# Patient Record
Sex: Male | Born: 1950 | Race: Black or African American | Hispanic: No | Marital: Single | Smoking: Never smoker
Health system: Southern US, Community
[De-identification: ages and names within clinical notes are randomized; demographics above are authoritative.]

## PROBLEM LIST (undated history)

## (undated) DIAGNOSIS — I639 Cerebral infarction, unspecified: Secondary | ICD-10-CM

## (undated) DIAGNOSIS — K56609 Unspecified intestinal obstruction, unspecified as to partial versus complete obstruction: Secondary | ICD-10-CM

## (undated) DIAGNOSIS — E7800 Pure hypercholesterolemia, unspecified: Secondary | ICD-10-CM

## (undated) DIAGNOSIS — I10 Essential (primary) hypertension: Secondary | ICD-10-CM

## (undated) HISTORY — PX: COLONOSCOPY: SHX174

## (undated) HISTORY — PX: BONE MARROW BIOPSY: SHX199

## (undated) HISTORY — PX: OTHER SURGICAL HISTORY: SHX169

---

## 2009-10-16 DIAGNOSIS — I639 Cerebral infarction, unspecified: Secondary | ICD-10-CM

## 2009-10-16 HISTORY — DX: Cerebral infarction, unspecified: I63.9

## 2009-11-29 ENCOUNTER — Emergency Department (HOSPITAL_COMMUNITY): Admission: EM | Admit: 2009-11-29 | Discharge: 2009-11-29 | Payer: Self-pay | Admitting: Emergency Medicine

## 2011-01-05 LAB — URINALYSIS, ROUTINE W REFLEX MICROSCOPIC
Glucose, UA: 250 mg/dL — AB
Ketones, ur: NEGATIVE mg/dL
Nitrite: NEGATIVE
Protein, ur: NEGATIVE mg/dL
Specific Gravity, Urine: 1.012 (ref 1.005–1.030)
Urobilinogen, UA: 1 mg/dL (ref 0.0–1.0)
pH: 6.5 (ref 5.0–8.0)

## 2011-01-05 LAB — POCT I-STAT, CHEM 8
BUN: 19 mg/dL (ref 6–23)
Chloride: 104 mEq/L (ref 96–112)
Creatinine, Ser: 1.2 mg/dL (ref 0.4–1.5)
Glucose, Bld: 208 mg/dL — ABNORMAL HIGH (ref 70–99)
Hemoglobin: 12.6 g/dL — ABNORMAL LOW (ref 13.0–17.0)
Sodium: 140 mEq/L (ref 135–145)
TCO2: 29 mmol/L (ref 0–100)

## 2011-01-05 LAB — DIFFERENTIAL
Monocytes Relative: 7 % (ref 3–12)
Neutro Abs: 1.9 10*3/uL (ref 1.7–7.7)
Neutrophils Relative %: 53 % (ref 43–77)

## 2011-01-05 LAB — COMPREHENSIVE METABOLIC PANEL
AST: 29 U/L (ref 0–37)
Albumin: 3.6 g/dL (ref 3.5–5.2)
Alkaline Phosphatase: 39 U/L (ref 39–117)
BUN: 17 mg/dL (ref 6–23)
CO2: 31 mEq/L (ref 19–32)
Calcium: 8.4 mg/dL (ref 8.4–10.5)
Chloride: 104 mEq/L (ref 96–112)
GFR calc Af Amer: >60 mL/min (ref >60)
Glucose, Bld: 212 mg/dL — ABNORMAL HIGH (ref 70–99)
Potassium: 3.7 mEq/L (ref 3.5–5.1)
Total Protein: 7 g/dL (ref 6.0–8.3)

## 2011-01-05 LAB — CBC
Hemoglobin: 9.3 g/dL — ABNORMAL LOW (ref 13.0–17.0)
MCV: 110 fL — ABNORMAL HIGH (ref 78.0–100.0)
Platelets: 151 10*3/uL (ref 150–400)
RBC: 2.7 MIL/uL — ABNORMAL LOW (ref 4.22–5.81)
WBC: 3.6 10*3/uL — ABNORMAL LOW (ref 4.0–10.5)

## 2011-01-05 LAB — TYPE AND SCREEN
ABO/RH(D): A POS
Antibody Screen: NEGATIVE

## 2011-01-05 LAB — PROTIME-INR
INR: 1.64 — ABNORMAL HIGH (ref 0.00–1.49)
Prothrombin Time: 19.3 seconds — ABNORMAL HIGH (ref 11.6–15.2)

## 2011-01-05 LAB — APTT: aPTT: 25 seconds (ref 24–37)

## 2011-07-25 DIAGNOSIS — E785 Hyperlipidemia, unspecified: Secondary | ICD-10-CM | POA: Insufficient documentation

## 2013-10-30 DIAGNOSIS — M7520 Bicipital tendinitis, unspecified shoulder: Secondary | ICD-10-CM | POA: Insufficient documentation

## 2013-10-30 DIAGNOSIS — M67919 Unspecified disorder of synovium and tendon, unspecified shoulder: Secondary | ICD-10-CM | POA: Insufficient documentation

## 2013-11-19 HISTORY — PX: ROTATOR CUFF REPAIR: SHX139

## 2014-01-06 ENCOUNTER — Encounter (HOSPITAL_COMMUNITY): Payer: Self-pay | Admitting: Emergency Medicine

## 2014-01-06 DIAGNOSIS — Z79899 Other long term (current) drug therapy: Secondary | ICD-10-CM

## 2014-01-06 DIAGNOSIS — K565 Intestinal adhesions [bands], unspecified as to partial versus complete obstruction: Principal | ICD-10-CM | POA: Diagnosis present

## 2014-01-06 DIAGNOSIS — K5900 Constipation, unspecified: Secondary | ICD-10-CM | POA: Diagnosis present

## 2014-01-06 DIAGNOSIS — R630 Anorexia: Secondary | ICD-10-CM | POA: Diagnosis present

## 2014-01-06 DIAGNOSIS — F1010 Alcohol abuse, uncomplicated: Secondary | ICD-10-CM | POA: Diagnosis present

## 2014-01-06 DIAGNOSIS — A088 Other specified intestinal infections: Secondary | ICD-10-CM | POA: Diagnosis present

## 2014-01-06 DIAGNOSIS — R259 Unspecified abnormal involuntary movements: Secondary | ICD-10-CM | POA: Diagnosis not present

## 2014-01-06 DIAGNOSIS — Z8673 Personal history of transient ischemic attack (TIA), and cerebral infarction without residual deficits: Secondary | ICD-10-CM

## 2014-01-06 DIAGNOSIS — D62 Acute posthemorrhagic anemia: Secondary | ICD-10-CM | POA: Diagnosis not present

## 2014-01-06 DIAGNOSIS — K929 Disease of digestive system, unspecified: Secondary | ICD-10-CM | POA: Diagnosis not present

## 2014-01-06 DIAGNOSIS — A498 Other bacterial infections of unspecified site: Secondary | ICD-10-CM | POA: Diagnosis present

## 2014-01-06 DIAGNOSIS — E872 Acidosis, unspecified: Secondary | ICD-10-CM | POA: Diagnosis present

## 2014-01-06 DIAGNOSIS — E878 Other disorders of electrolyte and fluid balance, not elsewhere classified: Secondary | ICD-10-CM | POA: Diagnosis not present

## 2014-01-06 DIAGNOSIS — M6282 Rhabdomyolysis: Secondary | ICD-10-CM | POA: Diagnosis not present

## 2014-01-06 DIAGNOSIS — Y838 Other surgical procedures as the cause of abnormal reaction of the patient, or of later complication, without mention of misadventure at the time of the procedure: Secondary | ICD-10-CM | POA: Diagnosis not present

## 2014-01-06 DIAGNOSIS — K7200 Acute and subacute hepatic failure without coma: Secondary | ICD-10-CM | POA: Diagnosis present

## 2014-01-06 DIAGNOSIS — N170 Acute kidney failure with tubular necrosis: Secondary | ICD-10-CM | POA: Diagnosis present

## 2014-01-06 DIAGNOSIS — K352 Acute appendicitis with generalized peritonitis, without abscess: Secondary | ICD-10-CM | POA: Diagnosis present

## 2014-01-06 DIAGNOSIS — K560 Paralytic ileus: Secondary | ICD-10-CM | POA: Diagnosis not present

## 2014-01-06 DIAGNOSIS — R066 Hiccough: Secondary | ICD-10-CM | POA: Diagnosis present

## 2014-01-06 DIAGNOSIS — E861 Hypovolemia: Secondary | ICD-10-CM | POA: Diagnosis present

## 2014-01-06 DIAGNOSIS — R5381 Other malaise: Secondary | ICD-10-CM | POA: Diagnosis not present

## 2014-01-06 DIAGNOSIS — A491 Streptococcal infection, unspecified site: Secondary | ICD-10-CM | POA: Diagnosis present

## 2014-01-06 DIAGNOSIS — E46 Unspecified protein-calorie malnutrition: Secondary | ICD-10-CM | POA: Diagnosis not present

## 2014-01-06 DIAGNOSIS — F411 Generalized anxiety disorder: Secondary | ICD-10-CM | POA: Diagnosis not present

## 2014-01-06 DIAGNOSIS — K35209 Acute appendicitis with generalized peritonitis, without abscess, unspecified as to perforation: Secondary | ICD-10-CM | POA: Diagnosis present

## 2014-01-06 DIAGNOSIS — E785 Hyperlipidemia, unspecified: Secondary | ICD-10-CM | POA: Diagnosis present

## 2014-01-06 DIAGNOSIS — E870 Hyperosmolality and hypernatremia: Secondary | ICD-10-CM | POA: Diagnosis not present

## 2014-01-06 DIAGNOSIS — E871 Hypo-osmolality and hyponatremia: Secondary | ICD-10-CM | POA: Diagnosis present

## 2014-01-06 DIAGNOSIS — I10 Essential (primary) hypertension: Secondary | ICD-10-CM | POA: Diagnosis present

## 2014-01-06 NOTE — ED Notes (Signed)
Pt. reports generalized abdominal pain with nausea , vomitting and constipation onset last Thursday seen by his PCP diagnosed with viral illness. No fever or chills.

## 2014-01-07 ENCOUNTER — Encounter (HOSPITAL_COMMUNITY): Payer: Self-pay | Admitting: General Surgery

## 2014-01-07 ENCOUNTER — Inpatient Hospital Stay (HOSPITAL_COMMUNITY)
Admission: EM | Admit: 2014-01-07 | Discharge: 2014-01-21 | DRG: 335 | Disposition: A | Discharge status: Home / Self Care | Payer: BC Managed Care – PPO | Attending: Internal Medicine | Admitting: Internal Medicine

## 2014-01-07 ENCOUNTER — Emergency Department (HOSPITAL_COMMUNITY): Payer: BC Managed Care – PPO

## 2014-01-07 DIAGNOSIS — N179 Acute kidney failure, unspecified: Secondary | ICD-10-CM | POA: Diagnosis present

## 2014-01-07 DIAGNOSIS — D649 Anemia, unspecified: Secondary | ICD-10-CM

## 2014-01-07 DIAGNOSIS — K56609 Unspecified intestinal obstruction, unspecified as to partial versus complete obstruction: Secondary | ICD-10-CM

## 2014-01-07 DIAGNOSIS — Z9889 Other specified postprocedural states: Secondary | ICD-10-CM

## 2014-01-07 DIAGNOSIS — R112 Nausea with vomiting, unspecified: Secondary | ICD-10-CM

## 2014-01-07 DIAGNOSIS — E8729 Other acidosis: Secondary | ICD-10-CM | POA: Diagnosis present

## 2014-01-07 DIAGNOSIS — R17 Unspecified jaundice: Secondary | ICD-10-CM

## 2014-01-07 DIAGNOSIS — E872 Acidosis, unspecified: Secondary | ICD-10-CM

## 2014-01-07 DIAGNOSIS — E871 Hypo-osmolality and hyponatremia: Secondary | ICD-10-CM

## 2014-01-07 DIAGNOSIS — R7401 Elevation of levels of liver transaminase levels: Secondary | ICD-10-CM

## 2014-01-07 DIAGNOSIS — R740 Nonspecific elevation of levels of transaminase and lactic acid dehydrogenase [LDH]: Secondary | ICD-10-CM

## 2014-01-07 DIAGNOSIS — F1010 Alcohol abuse, uncomplicated: Secondary | ICD-10-CM

## 2014-01-07 DIAGNOSIS — K3532 Acute appendicitis with perforation and localized peritonitis, without abscess: Secondary | ICD-10-CM

## 2014-01-07 DIAGNOSIS — N19 Unspecified kidney failure: Secondary | ICD-10-CM

## 2014-01-07 DIAGNOSIS — S37009A Unspecified injury of unspecified kidney, initial encounter: Secondary | ICD-10-CM

## 2014-01-07 HISTORY — DX: Essential (primary) hypertension: I10

## 2014-01-07 HISTORY — DX: Unspecified intestinal obstruction, unspecified as to partial versus complete obstruction: K56.609

## 2014-01-07 HISTORY — DX: Cerebral infarction, unspecified: I63.9

## 2014-01-07 HISTORY — DX: Pure hypercholesterolemia, unspecified: E78.00

## 2014-01-07 LAB — URINALYSIS, ROUTINE W REFLEX MICROSCOPIC
Glucose, UA: NEGATIVE mg/dL
Ketones, ur: NEGATIVE mg/dL
Leukocytes, UA: NEGATIVE
NITRITE: NEGATIVE
PROTEIN: 30 mg/dL — AB
Specific Gravity, Urine: 1.018 (ref 1.005–1.030)
UROBILINOGEN UA: 1 mg/dL (ref 0.0–1.0)
pH: 5 (ref 5.0–8.0)

## 2014-01-07 LAB — COMPREHENSIVE METABOLIC PANEL
ALBUMIN: 3.2 g/dL — AB (ref 3.5–5.2)
ALK PHOS: 94 U/L (ref 39–117)
ALT: 69 U/L — ABNORMAL HIGH (ref 0–53)
AST: 40 U/L — AB (ref 0–37)
BILIRUBIN TOTAL: 4.7 mg/dL — AB (ref 0.3–1.2)
BUN: 90 mg/dL — ABNORMAL HIGH (ref 6–23)
CALCIUM: 9.4 mg/dL (ref 8.4–10.5)
CO2: 23 meq/L (ref 19–32)
CREATININE: 8.62 mg/dL — AB (ref 0.50–1.35)
Chloride: 84 mEq/L — ABNORMAL LOW (ref 96–112)
GFR calc Af Amer: 7 mL/min — ABNORMAL LOW (ref >90)
GFR, EST NON AFRICAN AMERICAN: 6 mL/min — AB (ref >90)
Glucose, Bld: 161 mg/dL — ABNORMAL HIGH (ref 70–99)
Potassium: 4.2 mEq/L (ref 3.7–5.3)
Sodium: 129 mEq/L — ABNORMAL LOW (ref 137–147)
TOTAL PROTEIN: 8.1 g/dL (ref 6.0–8.3)

## 2014-01-07 LAB — CBC WITH DIFFERENTIAL/PLATELET
BASOS ABS: 0.1 10*3/uL (ref 0.0–0.1)
BASOS PCT: 1 % (ref 0–1)
EOS PCT: 0 % (ref 0–5)
Eosinophils Absolute: 0 10*3/uL (ref 0.0–0.7)
HCT: 31.4 % — ABNORMAL LOW (ref 39.0–52.0)
Hemoglobin: 11.6 g/dL — ABNORMAL LOW (ref 13.0–17.0)
LYMPHS PCT: 12 % (ref 12–46)
Lymphs Abs: 1.3 10*3/uL (ref 0.7–4.0)
MCH: 33.6 pg (ref 26.0–34.0)
MCHC: 36.9 g/dL — AB (ref 30.0–36.0)
MCV: 91 fL (ref 78.0–100.0)
MONO ABS: 0.9 10*3/uL (ref 0.1–1.0)
MONOS PCT: 8 % (ref 3–12)
NEUTROS ABS: 8.4 10*3/uL — AB (ref 1.7–7.7)
NEUTROS PCT: 79 % — AB (ref 43–77)
PLATELETS: 288 10*3/uL (ref 150–400)
RBC: 3.45 MIL/uL — ABNORMAL LOW (ref 4.22–5.81)
RDW: 12.3 % (ref 11.5–15.5)
WBC: 10.7 10*3/uL — ABNORMAL HIGH (ref 4.0–10.5)

## 2014-01-07 LAB — BASIC METABOLIC PANEL
BUN: 89 mg/dL — ABNORMAL HIGH (ref 6–23)
CHLORIDE: 88 meq/L — AB (ref 96–112)
CO2: 25 mEq/L (ref 19–32)
Calcium: 8.6 mg/dL (ref 8.4–10.5)
Creatinine, Ser: 7.05 mg/dL — ABNORMAL HIGH (ref 0.50–1.35)
GFR, EST AFRICAN AMERICAN: 9 mL/min — AB (ref >90)
GFR, EST NON AFRICAN AMERICAN: 7 mL/min — AB (ref >90)
GLUCOSE: 112 mg/dL — AB (ref 70–99)
POTASSIUM: 4 meq/L (ref 3.7–5.3)
SODIUM: 132 meq/L — AB (ref 137–147)

## 2014-01-07 LAB — HEPATIC FUNCTION PANEL
ALBUMIN: 2.8 g/dL — AB (ref 3.5–5.2)
ALT: 54 U/L — ABNORMAL HIGH (ref 0–53)
AST: 26 U/L (ref 0–37)
Alkaline Phosphatase: 85 U/L (ref 39–117)
Bilirubin, Direct: 2.5 mg/dL — ABNORMAL HIGH (ref 0.0–0.3)
Indirect Bilirubin: 1.6 mg/dL — ABNORMAL HIGH (ref 0.3–0.9)
Total Bilirubin: 4.1 mg/dL — ABNORMAL HIGH (ref 0.3–1.2)
Total Protein: 7.3 g/dL (ref 6.0–8.3)

## 2014-01-07 LAB — I-STAT VENOUS BLOOD GAS, ED
Bicarbonate: 25.6 mEq/L — ABNORMAL HIGH (ref 20.0–24.0)
O2 SAT: 60 %
PCO2 VEN: 44.8 mmHg — AB (ref 45.0–50.0)
PO2 VEN: 33 mmHg (ref 30.0–45.0)
TCO2: 27 mmol/L (ref 0–100)
pH, Ven: 7.365 — ABNORMAL HIGH (ref 7.250–7.300)

## 2014-01-07 LAB — URINE MICROSCOPIC-ADD ON

## 2014-01-07 LAB — CREATININE, URINE, RANDOM: Creatinine, Urine: 213.8 mg/dL

## 2014-01-07 LAB — LIPASE, BLOOD: LIPASE: 24 U/L (ref 11–59)

## 2014-01-07 LAB — SODIUM, URINE, RANDOM: SODIUM UR: 26 meq/L

## 2014-01-07 LAB — LACTIC ACID, PLASMA: Lactic Acid, Venous: 1.3 mmol/L (ref 0.5–2.2)

## 2014-01-07 MED ORDER — INFLUENZA VAC SPLIT QUAD 0.5 ML IM SUSP
0.5000 mL | INTRAMUSCULAR | Status: AC
  Administered 2014-01-08: 0.5 mL via INTRAMUSCULAR
  Filled 2014-01-07: qty 0.5

## 2014-01-07 MED ORDER — SODIUM CHLORIDE 0.9 % IV SOLN
25.0000 mg | Freq: Once | INTRAVENOUS | Status: DC
  Filled 2014-01-07 (×2): qty 1

## 2014-01-07 MED ORDER — SODIUM CHLORIDE 0.9 % IV SOLN
1000.0000 mL | Freq: Once | INTRAVENOUS | Status: AC
  Administered 2014-01-07: 1000 mL via INTRAVENOUS

## 2014-01-07 MED ORDER — SODIUM CHLORIDE 0.9 % IV SOLN
INTRAVENOUS | Status: DC
  Administered 2014-01-07 – 2014-01-09 (×9): via INTRAVENOUS

## 2014-01-07 MED ORDER — HYDROMORPHONE HCL PF 1 MG/ML IJ SOLN
1.0000 mg | Freq: Once | INTRAMUSCULAR | Status: AC
  Administered 2014-01-07: 1 mg via INTRAVENOUS
  Filled 2014-01-07: qty 1

## 2014-01-07 MED ORDER — ONDANSETRON HCL 4 MG/2ML IJ SOLN
4.0000 mg | Freq: Four times a day (QID) | INTRAMUSCULAR | Status: DC | PRN
Start: 2014-01-07 — End: 2014-01-21
  Administered 2014-01-07: 4 mg via INTRAVENOUS
  Filled 2014-01-07 (×2): qty 2

## 2014-01-07 MED ORDER — HEPARIN SODIUM (PORCINE) 5000 UNIT/ML IJ SOLN
5000.0000 [IU] | Freq: Three times a day (TID) | INTRAMUSCULAR | Status: DC
  Administered 2014-01-07 – 2014-01-15 (×22): 5000 [IU] via SUBCUTANEOUS
  Filled 2014-01-07 (×28): qty 1

## 2014-01-07 MED ORDER — MORPHINE SULFATE 2 MG/ML IJ SOLN
2.0000 mg | INTRAMUSCULAR | Status: DC | PRN
  Administered 2014-01-07 – 2014-01-20 (×15): 2 mg via INTRAVENOUS
  Filled 2014-01-07 (×17): qty 1

## 2014-01-07 MED ORDER — ONDANSETRON HCL 4 MG/2ML IJ SOLN
4.0000 mg | Freq: Once | INTRAMUSCULAR | Status: AC
  Administered 2014-01-07: 4 mg via INTRAVENOUS
  Filled 2014-01-07: qty 2

## 2014-01-07 MED ORDER — LIDOCAINE HCL 2 % EX GEL
Freq: Once | CUTANEOUS | Status: AC
  Administered 2014-01-07: 20
  Filled 2014-01-07: qty 20

## 2014-01-07 MED ORDER — SODIUM CHLORIDE 0.9 % IV BOLUS (SEPSIS)
1000.0000 mL | Freq: Once | INTRAVENOUS | Status: AC
  Administered 2014-01-07: 1000 mL via INTRAVENOUS

## 2014-01-07 MED ORDER — MORPHINE SULFATE 4 MG/ML IJ SOLN
4.0000 mg | Freq: Once | INTRAMUSCULAR | Status: AC
  Administered 2014-01-07: 4 mg via INTRAVENOUS
  Filled 2014-01-07: qty 1

## 2014-01-07 NOTE — ED Notes (Signed)
Pt finished with oral CT contrast. CT made aware. 

## 2014-01-07 NOTE — ED Notes (Signed)
Patient transported to X-ray 

## 2014-01-07 NOTE — ED Notes (Signed)
Patient transported to CT 

## 2014-01-07 NOTE — ED Provider Notes (Signed)
CSN: [id number]     Arrival date & time 01/06/14  2314 History   First MD Initiated Contact with Patient 01/07/14 0325     Chief Complaint  Patient presents with  . Abdominal Pain     (Consider location/radiation/quality/duration/timing/severity/associated sxs/prior Treatment) HPI Comments: Pt comes in with cc of abd pain. Pt has hx of HTN, HL otherwise, pretty healthy. Reports that he started having nausea and abd pain last Thursday. Saw his pcp and was thought to have viral gastro. With time, his symptoms have gotten worse. He is having diffuse, and severe abdominal pain. Several episodes of emesis, with po intake usually, and anorexia. Pt is also having constipation. No abd surgeries.  Patient is a 63 y.o. male presenting with abdominal pain. The history is provided by the patient.  Abdominal Pain Associated symptoms: constipation, nausea and vomiting   Associated symptoms: no chest pain, no cough, no dysuria and no shortness of breath     Past Medical History  Diagnosis Date  . Hypertension   . Hypercholesterolemia   . Stroke    Past Surgical History  Procedure Laterality Date  . Rotator cuff repair     No family history on file. History  Substance Use Topics  . Smoking status: Never Smoker   . Smokeless tobacco: Not on file  . Alcohol Use: Yes    Review of Systems  Constitutional: Negative for activity change and appetite change.  Respiratory: Negative for cough and shortness of breath.   Cardiovascular: Negative for chest pain.  Gastrointestinal: Positive for nausea, vomiting, abdominal pain and constipation.  Genitourinary: Negative for dysuria.  All other systems reviewed and are negative.      Allergies  Review of patient's allergies indicates no known allergies.  Home Medications   Current Outpatient Rx  Name  Route  Sig  Dispense  Refill  . co-enzyme Q-10 30 MG capsule   Oral   Take 30 mg by mouth daily.         Marland Kitchen. lisinopril (PRINIVIL,ZESTRIL)  20 MG tablet   Oral   Take 30-40 mg by mouth daily.         . Multiple Vitamin (MULTIVITAMIN WITH MINERALS) TABS tablet   Oral   Take 1 tablet by mouth daily.         . naproxen sodium (ANAPROX) 220 MG tablet   Oral   Take 440 mg by mouth daily as needed (for pain).         . Omega-3 Fatty Acids (FISH OIL) 1000 MG CAPS   Oral   Take 1,000 mg by mouth daily.         Marland Kitchen. omeprazole (PRILOSEC) 20 MG capsule   Oral   Take 20 mg by mouth daily.         . simvastatin (ZOCOR) 20 MG tablet   Oral   Take 20 mg by mouth daily at 6 PM.          BP 137/70  Pulse 83  Temp(Src) 98.5 F (36.9 C) (Oral)  Resp 18  Ht 6\' 2"  (1.88 m)  Wt 215 lb (97.523 kg)  BMI 27.59 kg/m2  SpO2 97% Physical Exam  Nursing note and vitals reviewed. Constitutional: He is oriented to person, place, and time. He appears well-developed.  HENT:  Head: Normocephalic and atraumatic.  Eyes: Conjunctivae and EOM are normal. Pupils are equal, round, and reactive to light.  Neck: Normal range of motion. Neck supple.  Cardiovascular: Normal rate and regular rhythm.  Pulmonary/Chest: Effort normal and breath sounds normal.  Abdominal: Soft. Bowel sounds are normal. He exhibits distension. There is tenderness. There is guarding. There is no rebound.  Neurological: He is alert and oriented to person, place, and time.  Skin: Skin is warm.    ED Course  Procedures (including critical care time) Labs Review Labs Reviewed  CBC WITH DIFFERENTIAL - Abnormal; Notable for the following:    WBC 10.7 (*)    RBC 3.45 (*)    Hemoglobin 11.6 (*)    HCT 31.4 (*)    MCHC 36.9 (*)    Neutrophils Relative % 79 (*)    Neutro Abs 8.4 (*)    All other components within normal limits  COMPREHENSIVE METABOLIC PANEL - Abnormal; Notable for the following:    Sodium 129 (*)    Chloride 84 (*)    Glucose, Bld 161 (*)    BUN 90 (*)    Creatinine, Ser 8.62 (*)    Albumin 3.2 (*)    AST 40 (*)    ALT 69 (*)    Total  Bilirubin 4.7 (*)    GFR calc non Af Amer 6 (*)    GFR calc Af Amer 7 (*)    All other components within normal limits  I-STAT VENOUS BLOOD GAS, ED - Abnormal; Notable for the following:    pH, Ven 7.365 (*)    pCO2, Ven 44.8 (*)    Bicarbonate 25.6 (*)    All other components within normal limits  LIPASE, BLOOD  URINALYSIS, ROUTINE W REFLEX MICROSCOPIC  SODIUM, URINE, RANDOM  CREATININE, URINE, RANDOM  BLOOD GAS, VENOUS   Imaging Review Dg Abd Acute W/chest  01/07/2014   CLINICAL DATA:  Nausea and vomiting.  Abdominal pain.  EXAM: ACUTE ABDOMEN SERIES (ABDOMEN 2 VIEW & CHEST 1 VIEW)  COMPARISON:  No priors.  FINDINGS: Lung volumes are normal. No consolidative airspace disease. No pleural effusions. No pneumothorax. No pulmonary nodule or mass noted. Pulmonary vasculature and the cardiomediastinal silhouette are within normal limits.  Numerous dilated loops of small bowel measuring up to 6.9 cm in diameter, with multiple air-fluid levels. Paucity of colonic gas. No pneumoperitoneum.  IMPRESSION: 1. Findings, as above, highly concerning for small bowel obstruction. 2. No pneumoperitoneum at this time. 3. No radiographic evidence of acute cardiopulmonary disease.   Electronically Signed   By: Trudie Reed M.D.   On: 01/07/2014 04:02     EKG Interpretation None      MDM   Final diagnoses:  SBO (small bowel obstruction)  AKI (acute kidney injury)  Uremia    DDx includes: Pancreatitis Hepatobiliary pathology including cholecystitis Gastritis/PUD SBO ACS syndrome Aortic Dissection AAA Tumors Intra abdominal abscess Thrombosis Mesenteric ischemia Diverticulitis Peritonitis Appendicitis Hernia Nephrolithiasis Pyelonephritis UTI/Cystitis  PT comes in with cc of abd pain. Pt's exam is concerning - with guarding. AAS ordered - shows concerns for SBO. Surgery was consulted - Dr. Jiles Harold to see patient. CT non contrast ordered  - after which NG lavage will be  placed.  Pt has renal failure, with uremia. Likely pre-renal. Hydration started in the ER. Pt also on naproxen and lisinopril - which are likely contributing. He is aox3, K is normal, BUN is elevated, but i dont think we need to call nephrology at this time. Also, bilirubin is elevated, LFTs are elevated.         Derwood Kaplan, MD 01/07/14 [phone]

## 2014-01-07 NOTE — ED Notes (Signed)
Pt has returned from radiology.  

## 2014-01-07 NOTE — ED Notes (Signed)
MD Dort with general surgery at bedside.

## 2014-01-07 NOTE — H&P (Signed)
Date: 01/07/2014               Patient Name:  Lonnie Henry MRN: [id number]  DOB: May 29, 1951 Age / Sex: 63 y.o., male   PCP: [place] Service: Internal Medicine Teaching Service         Attending Physician: Dr. Dominic Pea, DO    First Contact: Dr. Lucila Maine Pager: [phone]  Second Contact: Dr. Hayes Ludwig Pager: [phone]       After Hours (After 5p/  First Contact Pager: [phone]  weekends / holidays): Second Contact Pager: [phone]   Chief Complaint: Nausea, vomiting  History of Present Illness:  The patient is a 63 yo man, history of HTN, prior CVA, presenting with nausea, vomiting, and abdominal pain.  Six days prior to admission, the patient notes sudden onset of nausea, with vomiting of clear/brownish liquid, with no blood or melena.  The next day, the patient developed bilateral lower quadrant constant "aching" abdominal pain, worsened by putting pressure on the area.  Since that time, the patient notes progressive worsening of his symptoms, with significantly impaired PO intake during this time.  The patient also notes constipation, with his last bowel movement 2 days prior to admission after taking several doses of dulcolax, with no bowel movement or passage of gas since that time.  He presented to his PCP 5 days prior to admission, and was diagnosed with a viral GI, and given an unknown medication (?omeprazole), which did not improve his symptoms.  The patient has no prior abdominal surgeries, and colonoscopy 6 months ago was reportedly normal.  On presentation to the ED, CT showed evidence of small bowel obstruction.  Meds: Current Facility-Administered Medications  Medication Dose Route Frequency Provider Last Rate Last Dose  . 0.9 %  sodium chloride infusion   Intravenous Continuous Hester Mates, MD      . chlorproMAZINE (THORAZINE) 25 mg in sodium chloride 0.9 % 25 mL IVPB  25 mg Intravenous Once Ankit Nanavati, MD      . morphine 2 MG/ML injection 2 mg   2 mg Intravenous Q4H PRN Hester Mates, MD      . ondansetron [facility]) injection 4 mg  4 mg Intravenous Q6H PRN Hester Mates, MD       Current Outpatient Prescriptions  Medication Sig Dispense Refill  . co-enzyme Q-10 30 MG capsule Take 30 mg by mouth daily.      Marland Kitchen lisinopril (PRINIVIL,ZESTRIL) 20 MG tablet Take 30-40 mg by mouth daily.      . Multiple Vitamin (MULTIVITAMIN WITH MINERALS) TABS tablet Take 1 tablet by mouth daily.      . naproxen sodium (ANAPROX) 220 MG tablet Take 440 mg by mouth daily as needed (for pain).      . Omega-3 Fatty Acids (FISH OIL) 1000 MG CAPS Take 1,000 mg by mouth daily.      Marland Kitchen omeprazole (PRILOSEC) 20 MG capsule Take 20 mg by mouth daily.      . simvastatin (ZOCOR) 20 MG tablet Take 20 mg by mouth daily at 6 PM.        Allergies: Allergies as of 01/06/2014  . (No Known Allergies)   Past Medical History  Diagnosis Date  . Hypertension     Dr. Sherrie [place] (PCP)  . Hypercholesterolemia   . Stroke 2011    at [facility] after sex   Past Surgical History  Procedure Laterality Date  . Rotator  cuff repair Right 11/19/13  . Arthroscopic knee Left   . Bone marrow biopsy    . Colonoscopy      Benign findings per pt  . Colonoscopy      Benign findings per pt   Family History  Problem Relation Age of Onset  . Cirrhosis Father     ETOH  . Prostate cancer Father     Cause of death   History   Social History  . Marital Status: Single    Spouse Name: N/A    Number of Children: N/A  . Years of Education: N/A   Occupational History  . Not on file.   Social History Main Topics  . Smoking status: Never Smoker   . Smokeless tobacco: Not on file  . Alcohol Use: Yes     Comment: daily 2-3 beers  . Drug Use: No  . Sexual Activity: Not on file   Other Topics Concern  . Not on file   Social History Narrative   Works as a Agricultural engineer.  Runs regularly for exercise.    Review of Systems: General: +subjective chills Skin: no rash HEENT:  no blurry vision, hearing changes, sore throat Pulm: no dyspnea, coughing, wheezing CV: no chest pain, palpitations, shortness of breath Abd: see HPI GU: no dysuria, hematuria, polyuria Ext: no arthralgias, myalgias Neuro: no weakness, numbness, or tingling  Physical Exam: Blood pressure 126/71, pulse 80, temperature 98.5 F (36.9 C), temperature source Oral, resp. rate 18, height _0  (1.88 m), weight 215 lb (97.523 kg), SpO2 98.00%. General: alert, cooperative, appears uncomfortable HEENT: PERRL, EOMI, no significant scleral icterus noted, oropharynx clear and non-erythematous, NG tube in place Neck: supple Lungs: clear to ascultation bilaterally, normal work of respiration, no wheezes, rales, ronchi Heart: regular rate and rhythm, no murmurs, gallops, or rubs Abdomen: round, diffusely ttp though worse in RLQ and LLQ, moderately distended, +voluntary guarding, no rebound tenderness, hypoactive bowel sounds Extremities: no cyanosis, clubbing, or edema Neurologic: alert & oriented X3, cranial nerves II-XII intact, strength grossly intact, sensation intact to light touch  Lab results: Basic Metabolic Panel:  Recent Labs  01/06/14 2345  NA 129*  K 4.2  CL 84*  CO2 23  GLUCOSE 161*  BUN 90*  CREATININE 8.62*  CALCIUM 9.4   Liver Function Tests:  Recent Labs  01/06/14 2345  AST 40*  ALT 69*  ALKPHOS 94  BILITOT 4.7*  PROT 8.1  ALBUMIN 3.2*    Recent Labs  01/06/14 2345  LIPASE 24   CBC:  Recent Labs  01/06/14 2345  WBC 10.7*  NEUTROABS 8.4*  HGB 11.6*  HCT 31.4*  MCV 91.0  PLT 288    Imaging results:  Ct Abdomen Pelvis Wo Contrast  01/07/2014   CLINICAL DATA:  Abdominal tenderness and guarding.  EXAM: CT ABDOMEN AND PELVIS WITHOUT CONTRAST  TECHNIQUE: Multidetector CT imaging of the abdomen and pelvis was performed following the standard protocol without intravenous contrast.  COMPARISON:  No priors.  FINDINGS: Lung Bases: Dependent subsegmental  atelectasis in the lower lobes of the lungs bilaterally (right greater than left). Fluid filled patulous esophagus.  Abdomen/Pelvis: The unenhanced appearance of the liver, gallbladder, pancreas, spleen, bilateral adrenal glands and the right kidney is unremarkable. Probable extra renal pelvis in the left kidney. Left kidney is otherwise unremarkable in appearance.  Profound dilatation of the small bowel with multiple air-fluid levels. Small bowel loops measure up to 6.7 cm in diameter. Stomach also appears distended. Distal small  bowel appears decompressed, as does the colon. An exact transition point is not confidently identified on today's non contrast CT examination. Trace volume of ascites. No pneumoperitoneum. No definite lymphadenopathy identified within the abdomen or pelvis on today's non contrast CT examination.  Musculoskeletal: There are no aggressive appearing lytic or blastic lesions noted in the visualized portions of the skeleton.  IMPRESSION: 1. Small bowel obstruction. An exact transition point is not confidently identified on today's non contrast CT examination. 2. Trace volume of ascites. 3. No pneumoperitoneum.   Electronically Signed   By: Vinnie Langton M.D.   On: 01/07/2014 06:56   Dg Abd Acute W/chest  01/07/2014   CLINICAL DATA:  Nausea and vomiting.  Abdominal pain.  EXAM: ACUTE ABDOMEN SERIES (ABDOMEN 2 VIEW & CHEST 1 VIEW)  COMPARISON:  No priors.  FINDINGS: Lung volumes are normal. No consolidative airspace disease. No pleural effusions. No pneumothorax. No pulmonary nodule or mass noted. Pulmonary vasculature and the cardiomediastinal silhouette are within normal limits.  Numerous dilated loops of small bowel measuring up to 6.9 cm in diameter, with multiple air-fluid levels. Paucity of colonic gas. No pneumoperitoneum.  IMPRESSION: 1. Findings, as above, highly concerning for small bowel obstruction. 2. No pneumoperitoneum at this time. 3. No radiographic evidence of acute  cardiopulmonary disease.   Electronically Signed   By: Vinnie Langton M.D.   On: 01/07/2014 04:02    Assessment & Plan by Problem: The patient is a 63 yo man, presenting with abdominal pain, nausea, and vomiting, found to have a small bowel obstruction.  # Acute Small Bowel Obstruction - SBO seen on CT abdomen.  No history of prior abdominal surgeries.  Possible preceding viral gastroenteritis. -surgery consulted, appreciate recs -NPO, NG tube -morphine, zofran for symptomatic relief -s/p 3 L NS bolus, continue IVF at 150 cc/hr  # Acute Kidney Injury - The patient presents with a Cr of 8.62, from his last known baseline of 1.2.  This likely represents a combination of prerenal azotemia (poor PO intake, vomiting, though BUN:Cr < 20) and lisinopril-induced ATN vs NSAID usage (pt states he has been taking medications daily until the day prior to admission, though he typically vomited shortly thereafter) -IVF per above -trend BMETs  # Elevated Anion Gap Acidosis - The patient presents with an AG = 22, likely secondary to uremia.  Consideration also given to bowel necrosis, in the setting of SBO. -check lactic acid -IVF per above -trend BMET  # Hyperbilirubinemia - The patient presents with tbili = 4.7.  This is likely secondary to hypoperfusion from hypovolemia.  Less likely hemolysis (Hb around baseline, though likely hemoconcentrated) vs liver disease (no known history) -fractionate bilirubin -repeat LFT's after IVF  # Hyponatremia - likely secondary to hypovolemia, in the setting of poor PO intake, vomiting. -IVF  # Alcohol use - The patient drinks 2-3 beers/day.  His last drink was approximately 1 week ago. -pt is outside the window for withdrawal  # Prophy - heparin  Dispo: Disposition is deferred at this time, awaiting improvement of current medical problems. Anticipated discharge in approximately 2-4 day(s).   The patient does have a current PCP (Dionne Volanda Napoleon) and  does not need an [facility] hospital follow-up appointment after discharge.  Signed: Hester Mates, MD 01/07/2014, 8:59 AM

## 2014-01-07 NOTE — Progress Notes (Signed)
Utilization review completed.  

## 2014-01-07 NOTE — ED Notes (Signed)
Attempted to start NG tube, surgery at bedside want us to come back later.

## 2014-01-07 NOTE — Consult Note (Signed)
Lonnie Henry 1951/04/06  [id number].    Requesting MD: Kathrynn Humble Chief Complaint/Reason for Consult: Abdominal pain + N/V / Possible SBO  HPI: History provided by patient and fianc at bedside Lonnie Henry is a 63 y.o. AA male with hx of HTN , hypercholesterolemia, and stroke who reported to [facility] with a 6 day hx of N/V and a 5 day history of diffuse abdominal pain. Pt states he was at work on 01/01/14 when he had a sudden onset of nausea that resulted in emesis.  He stated it was mostly "phlegm and green liquid".  He attributed this to having several beers and an undercooked burger from [employer] on 12/30/13.  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  He described the pain as sharp, constant and gradually worsening, and denied any radiation to chest or back.  He did state and one point he could feel the pain moving "up and down" his abdomen. He was seen by his PCP Dr. Vertell Limber at [facility] on 01/02/14, and was diagnosed with a viral enteritis and treated symptomatically.  He continued to have increased pain along with vomiting and decreased flatus.  Pt states he cannot remember passing gas since the abdominal pain started on 01/02/14.  He states he tried kaopectate and dulcolax on 01/04/14, with no relief to abdominal pain or vomiting, but did have several small BMs.  He denies any BMs or flatus since then.  Pt denies any history of the same or any abdominal surgeries. He reports subjective fever and chills, fatigue, and a few pound weight loss due to anorexia. He also c/o of constant hiccups since sometime this weekend, but is unsure exactly when they started. Pt denies urinary symptoms, yellowing of skin, or weight loss/gain.  He reports two colonoscopies with no negative findings, denies having a EGD, and also states he has been screened for Prostate CA with no findings.  No h/o cardiac, renal, hepatic, pulmonary problems.  He states he was told he  has narrowed "arteries", but cant tell me which arteries they were talking about.  He denies any deficits from the stroke.  ROS: All systems reviewed and otherwise negative except for as above  Family History  Problem Relation Age of Onset  . Cirrhosis Father     ETOH  . Prostate cancer Father     Cause of death    Past Medical History  Diagnosis Date  . Hypertension     Dr. Sherrie [facility] (PCP)  . Hypercholesterolemia   . Stroke 2011    at [facility] after sex    Past Surgical History  Procedure Laterality Date  . Rotator cuff repair Right 11/19/13  . Arthroscopic knee Left   . Bone marrow biopsy    . Colonoscopy      Benign findings per pt  . Colonoscopy      Benign findings per pt    Social History:  reports that he has never smoked. He does not have any smokeless tobacco history on file. He reports that he drinks alcohol. He reports that he does not use illicit drugs.  Allergies: No Known Allergies   (Not in a hospital admission)  Physical Exam: Blood pressure 135/80, pulse 76, temperature 98.5 F (36.9 C), temperature source Oral, resp. rate 18, height _0  (1.88 m), weight 215 lb (97.523 kg), SpO2 98.00%. General: pleasant, WD/WN AA male who is laying in bed in discomfort. HEENT: head is  normocephalic, atraumatic.  Sclera are mildly injected but non icteric, questionable yellowing most likely baseline.  PERRL.  Ears and nose without any masses or lesions.  Mouth is pink and moist.  Heart: regular, rate, and rhythm.  No obvious murmurs, gallops, or rubs noted.  Lungs: CTAB, no wheezes, rhonchi, or rales noted.  Respiratory effort nonlabored Abd: Some distension > upper abdomen, Diffuse tenderness > right side of abdomen. Positive guarding, Negative rebound.  Hypoactive bowel sounds. No obvious organomegaly but deep palpation limited by pain.  No hernias. MS: all 4 extremities are symmetrical with no cyanosis, clubbing, or edema. 2+ DP and radial pulses  bilaterally. Psych: A&Ox3 with an appropriate affect. Neuro: Extremity CSM intact bilaterally, no facial drift, Slow speech (possible 2/2 stroke OR hiccups + abdominal pain)  Results for orders placed during the hospital encounter of 01/07/14 (from the past 48 hour(s))  CBC WITH DIFFERENTIAL     Status: Abnormal   Collection Time    01/06/14 11:45 PM      Result Value Ref Range   WBC 10.7 (*) 4.0 - 10.5 K/uL   RBC 3.45 (*) 4.22 - 5.81 MIL/uL   Hemoglobin 11.6 (*) 13.0 - 17.0 g/dL   HCT 31.4 (*) 39.0 - 52.0 %   MCV 91.0  78.0 - 100.0 fL   MCH 33.6  26.0 - 34.0 pg   MCHC 36.9 (*) 30.0 - 36.0 g/dL   RDW 12.3  11.5 - 15.5 %   Platelets 288  150 - 400 K/uL   Comment: PLATELET CLUMPS NOTED ON SMEAR, COUNT APPEARS ADEQUATE   Neutrophils Relative % 79 (*) 43 - 77 %   Lymphocytes Relative 12  12 - 46 %   Monocytes Relative 8  3 - 12 %   Eosinophils Relative 0  0 - 5 %   Basophils Relative 1  0 - 1 %   Neutro Abs 8.4 (*) 1.7 - 7.7 K/uL   Lymphs Abs 1.3  0.7 - 4.0 K/uL   Monocytes Absolute 0.9  0.1 - 1.0 K/uL   Eosinophils Absolute 0.0  0.0 - 0.7 K/uL   Basophils Absolute 0.1  0.0 - 0.1 K/uL   Smear Review PLATELET CLUMPS NOTED ON SMEAR    COMPREHENSIVE METABOLIC PANEL     Status: Abnormal   Collection Time    01/06/14 11:45 PM      Result Value Ref Range   Sodium 129 (*) 137 - 147 mEq/L   Potassium 4.2  3.7 - 5.3 mEq/L   Chloride 84 (*) 96 - 112 mEq/L   CO2 23  19 - 32 mEq/L   Glucose, Bld 161 (*) 70 - 99 mg/dL   BUN 90 (*) 6 - 23 mg/dL   Creatinine, Ser 8.62 (*) 0.50 - 1.35 mg/dL   Calcium 9.4  8.4 - 10.5 mg/dL   Total Protein 8.1  6.0 - 8.3 g/dL   Albumin 3.2 (*) 3.5 - 5.2 g/dL   AST 40 (*) 0 - 37 U/L   ALT 69 (*) 0 - 53 U/L   Alkaline Phosphatase 94  39 - 117 U/L   Total Bilirubin 4.7 (*) 0.3 - 1.2 mg/dL   GFR calc non Af Amer 6 (*) >90 mL/min   GFR calc Af Amer 7 (*) >90 mL/min   Comment: (NOTE)     The eGFR has been calculated using the CKD EPI equation.     This  calculation has not been validated in all clinical situations.  eGFR's persistently <90 mL/min signify possible Chronic Kidney     Disease.  LIPASE, BLOOD     Status: None   Collection Time    01/06/14 11:45 PM      Result Value Ref Range   Lipase 24  11 - 59 U/L  I-STAT VENOUS BLOOD GAS, ED     Status: Abnormal   Collection Time    01/07/14  5:26 AM      Result Value Ref Range   pH, Ven 7.365 (*) 7.250 - 7.300   pCO2, Ven 44.8 (*) 45.0 - 50.0 mmHg   pO2, Ven 33.0  30.0 - 45.0 mmHg   Bicarbonate 25.6 (*) 20.0 - 24.0 mEq/L   TCO2 27  0 - 100 mmol/L   O2 Saturation 60.0     Sample type VENOUS     Comment NOTIFIED PHYSICIAN    URINALYSIS, ROUTINE W REFLEX MICROSCOPIC     Status: Abnormal   Collection Time    01/07/14  7:34 AM      Result Value Ref Range   Color, Urine AMBER (*) YELLOW   Comment: BIOCHEMICALS MAY BE AFFECTED BY COLOR   APPearance TURBID (*) CLEAR   Specific Gravity, Urine 1.018  1.005 - 1.030   pH 5.0  5.0 - 8.0   Glucose, UA NEGATIVE  NEGATIVE mg/dL   Hgb urine dipstick MODERATE (*) NEGATIVE   Bilirubin Urine SMALL (*) NEGATIVE   Ketones, ur NEGATIVE  NEGATIVE mg/dL   Protein, ur 30 (*) NEGATIVE mg/dL   Urobilinogen, UA 1.0  0.0 - 1.0 mg/dL   Nitrite NEGATIVE  NEGATIVE   Leukocytes, UA NEGATIVE  NEGATIVE  SODIUM, URINE, RANDOM     Status: None   Collection Time    01/07/14  7:34 AM      Result Value Ref Range   Sodium, Ur 26    CREATININE, URINE, RANDOM     Status: None   Collection Time    01/07/14  7:34 AM      Result Value Ref Range   Creatinine, Urine 213.80    URINE MICROSCOPIC-ADD ON     Status: Abnormal   Collection Time    01/07/14  7:34 AM      Result Value Ref Range   Squamous Epithelial / LPF RARE  RARE   RBC / HPF 3-6  <3 RBC/hpf   Bacteria, UA FEW (*) RARE   Urine-Other MUCOUS PRESENT     Ct Abdomen Pelvis Wo Contrast  01/07/2014   CLINICAL DATA:  Abdominal tenderness and guarding.  EXAM: CT ABDOMEN AND PELVIS WITHOUT CONTRAST   TECHNIQUE: Multidetector CT imaging of the abdomen and pelvis was performed following the standard protocol without intravenous contrast.  COMPARISON:  No priors.  FINDINGS: Lung Bases: Dependent subsegmental atelectasis in the lower lobes of the lungs bilaterally (right greater than left). Fluid filled patulous esophagus.  Abdomen/Pelvis: The unenhanced appearance of the liver, gallbladder, pancreas, spleen, bilateral adrenal glands and the right kidney is unremarkable. Probable extra renal pelvis in the left kidney. Left kidney is otherwise unremarkable in appearance.  Profound dilatation of the small bowel with multiple air-fluid levels. Small bowel loops measure up to 6.7 cm in diameter. Stomach also appears distended. Distal small bowel appears decompressed, as does the colon. An exact transition point is not confidently identified on today's non contrast CT examination. Trace volume of ascites. No pneumoperitoneum. No definite lymphadenopathy identified within the abdomen or pelvis on today's non contrast CT examination.  Musculoskeletal:  There are no aggressive appearing lytic or blastic lesions noted in the visualized portions of the skeleton.  IMPRESSION: 1. Small bowel obstruction. An exact transition point is not confidently identified on today's non contrast CT examination. 2. Trace volume of ascites. 3. No pneumoperitoneum.   Electronically Signed   By: Vinnie Langton M.D.   On: 01/07/2014 06:56   Dg Abd Acute W/chest  01/07/2014   CLINICAL DATA:  Nausea and vomiting.  Abdominal pain.  EXAM: ACUTE ABDOMEN SERIES (ABDOMEN 2 VIEW & CHEST 1 VIEW)  COMPARISON:  No priors.  FINDINGS: Lung volumes are normal. No consolidative airspace disease. No pleural effusions. No pneumothorax. No pulmonary nodule or mass noted. Pulmonary vasculature and the cardiomediastinal silhouette are within normal limits.  Numerous dilated loops of small bowel measuring up to 6.9 cm in diameter, with multiple air-fluid levels.  Paucity of colonic gas. No pneumoperitoneum.  IMPRESSION: 1. Findings, as above, highly concerning for small bowel obstruction. 2. No pneumoperitoneum at this time. 3. No radiographic evidence of acute cardiopulmonary disease.   Electronically Signed   By: Vinnie Langton M.D.   On: 01/07/2014 04:02      Assessment/Plan Small Bowel Obstruction Acute Kidney Injury Hyponatremia Hyperbilirubinemia Transaminitis High anion gap metabolic acidosis Nausea and Vomiting  1.  Consult Medicine to Admit ref elevated renal and liver levels, as well as comorbidities 2.  RN to place NG tube at bedside for conservative management 3.  NPO, bowel rest, IVF, pain control, antiemetics 4.  SCD's and lovenox for DVT proph 5.  Ambulate and IS 6.  ED to attempt Thorazine to relieve hiccups  Dispo: Surgery possible if not resolved over next several days. Treat conservatively for now.  Will follow.   Barrington Ellison, PA-S [facility] Surgery 01/07/2014, 8:27 AM Pager: [phone]

## 2014-01-07 NOTE — Consult Note (Signed)
Pain is less. Tender on R abdomen, L NT. No flatus. Continue hydration. I discussed the possible need for surgery if this obstruction does not open up. He is hoping to avoid that. Hopefully his renal function will improve with hydration. Will follow and I spoke with his family. Patient examined and I agree with the assessment and plan  Violeta GelinasBurke Jakerra Floyd, MD, MPH, FACS Trauma: [phone] General Surgery: [phone]  01/07/2014 5:48 PM

## 2014-01-07 NOTE — Progress Notes (Signed)
Pt admitted to the unit at 1245. Pt mental status is a&ox4. Pt oriented to room, staff, and call bell. Skin is intact. Full assessment charted in [facility]. Call bell within reach. Visitor guidelines reviewed w/ pt and/or family.

## 2014-01-07 NOTE — ED Notes (Signed)
Md at bedside

## 2014-01-08 ENCOUNTER — Inpatient Hospital Stay (HOSPITAL_COMMUNITY): Payer: BC Managed Care – PPO

## 2014-01-08 DIAGNOSIS — R109 Unspecified abdominal pain: Secondary | ICD-10-CM

## 2014-01-08 DIAGNOSIS — R1011 Right upper quadrant pain: Secondary | ICD-10-CM

## 2014-01-08 LAB — COMPREHENSIVE METABOLIC PANEL
ALT: 61 U/L — ABNORMAL HIGH (ref 0–53)
AST: 49 U/L — AB (ref 0–37)
Albumin: 2.5 g/dL — ABNORMAL LOW (ref 3.5–5.2)
Alkaline Phosphatase: 75 U/L (ref 39–117)
BILIRUBIN TOTAL: 7.3 mg/dL — AB (ref 0.3–1.2)
BUN: 77 mg/dL — AB (ref 6–23)
CALCIUM: 9 mg/dL (ref 8.4–10.5)
CHLORIDE: 102 meq/L (ref 96–112)
CO2: 24 meq/L (ref 19–32)
CREATININE: 3.42 mg/dL — AB (ref 0.50–1.35)
GFR, EST AFRICAN AMERICAN: 21 mL/min — AB (ref >90)
GFR, EST NON AFRICAN AMERICAN: 18 mL/min — AB (ref >90)
GLUCOSE: 94 mg/dL (ref 70–99)
Potassium: 3.9 mEq/L (ref 3.7–5.3)
Sodium: 143 mEq/L (ref 137–147)
Total Protein: 7.3 g/dL (ref 6.0–8.3)

## 2014-01-08 LAB — GLUCOSE, CAPILLARY: Glucose-Capillary: 93 mg/dL (ref 70–99)

## 2014-01-08 LAB — CK: Total CK: 265 U/L — ABNORMAL HIGH (ref 7–232)

## 2014-01-08 LAB — BILIRUBIN, DIRECT: BILIRUBIN DIRECT: 5.2 mg/dL — AB (ref 0.0–0.3)

## 2014-01-08 MED ORDER — BIOTENE DRY MOUTH MT LIQD
15.0000 mL | Freq: Two times a day (BID) | OROMUCOSAL | Status: DC
  Administered 2014-01-08 – 2014-01-21 (×19): 15 mL via OROMUCOSAL

## 2014-01-08 MED ORDER — PIPERACILLIN-TAZOBACTAM 3.375 G IVPB
3.3750 g | Freq: Three times a day (TID) | INTRAVENOUS | Status: DC
  Administered 2014-01-09 – 2014-01-20 (×33): 3.375 g via INTRAVENOUS
  Filled 2014-01-08 (×39): qty 50

## 2014-01-08 MED ORDER — CHLORHEXIDINE GLUCONATE 0.12 % MT SOLN
15.0000 mL | Freq: Two times a day (BID) | OROMUCOSAL | Status: DC
  Administered 2014-01-08 – 2014-01-21 (×25): 15 mL via OROMUCOSAL
  Filled 2014-01-08 (×28): qty 15

## 2014-01-08 MED ORDER — PIPERACILLIN-TAZOBACTAM 3.375 G IVPB 30 MIN
3.3750 g | Freq: Once | INTRAVENOUS | Status: AC
  Administered 2014-01-08: 3.375 g via INTRAVENOUS
  Filled 2014-01-08: qty 50

## 2014-01-08 NOTE — Progress Notes (Signed)
Subjective: Patient seen and examined at the bedside this morning. Pain is improving. It is mostly localized in the RLQ now. Still not passing flatus. Denies a history of appendicitis or appendectomy.  NGT output is 200cc bilious fluid this am.  Objective: Vital signs in last 24 hours: Filed Vitals:   01/07/14 1244 01/07/14 1604 01/07/14 2137 01/08/14 0605  BP: 112/68  109/63 117/72  Pulse: 90 95 89 82  Temp: 98.5 F (36.9 C)  99.5 F (37.5 C) 99.2 F (37.3 C)  TempSrc: Oral  Oral Oral  Resp: 20 20 20 20   Height: 6\' 2"  (1.88 m)     Weight: 213 lb 8 oz (96.843 kg)     SpO2: 98% 96% 94% 95%   Weight change: -1 lb 8 oz (-0.68 kg)  Intake/Output Summary (Last 24 hours) at 01/08/14 1102 Last data filed at 01/08/14 7846  Gross per 24 hour  Intake      0 ml  Output   3350 ml  Net  -3350 ml   Physical Exam:  General: alert, cooperative, appears uncomfortable HEENT: PERRL, EOMI, oropharynx clear and non-erythematous, NG tube in place Neck: supple Lungs: clear to ascultation bilaterally, normal work of respiration, no wheezes, rales, ronchi Heart: regular rate and rhythm, no murmurs, gallops, or rubs Abdomen: Hypoactive-to-mute bowel sounds, tender at McBurney's point, no Psoas sign, no rebound tenderness Extremities: no cyanosis, clubbing, or edema Neurologic: alert & oriented X3, cranial nerves II-XII intact, strength grossly intact, sensation intact to light touch  Lab Results: Basic Metabolic Panel:  Recent Labs Lab 01/06/14 2345 01/07/14 0912  NA 129* 132*  K 4.2 4.0  CL 84* 88*  CO2 23 25  GLUCOSE 161* 112*  BUN 90* 89*  CREATININE 8.62* 7.05*  CALCIUM 9.4 8.6   Liver Function Tests:  Recent Labs Lab 01/06/14 2345 01/07/14 0912  AST 40* 26  ALT 69* 54*  ALKPHOS 94 85  BILITOT 4.7* 4.1*  PROT 8.1 7.3  ALBUMIN 3.2* 2.8*    Recent Labs Lab 01/06/14 2345  LIPASE 24   No results found for this basename: AMMONIA,  in the last 168  hours CBC:  Recent Labs Lab 01/06/14 2345  WBC 10.7*  NEUTROABS 8.4*  HGB 11.6*  HCT 31.4*  MCV 91.0  PLT 288   Cardiac Enzymes: No results found for this basename: CKTOTAL, CKMB, CKMBINDEX, TROPONINI,  in the last 168 hours BNP: No results found for this basename: PROBNP,  in the last 168 hours D-Dimer: No results found for this basename: DDIMER,  in the last 168 hours CBG: No results found for this basename: GLUCAP,  in the last 168 hours Hemoglobin A1C: No results found for this basename: HGBA1C,  in the last 168 hours Fasting Lipid Panel: No results found for this basename: CHOL, HDL, LDLCALC, TRIG, CHOLHDL, LDLDIRECT,  in the last 168 hours Thyroid Function Tests: No results found for this basename: TSH, T4TOTAL, FREET4, T3FREE, THYROIDAB,  in the last 168 hours Coagulation: No results found for this basename: LABPROT, INR,  in the last 168 hours Anemia Panel: No results found for this basename: VITAMINB12, FOLATE, FERRITIN, TIBC, IRON, RETICCTPCT,  in the last 168 hours Urine Drug Screen: Drugs of Abuse  No results found for this basename: labopia,  cocainscrnur,  labbenz,  amphetmu,  thcu,  labbarb    Alcohol Level: No results found for this basename: ETH,  in the last 168 hours Urinalysis:  Recent Labs Lab 01/07/14 0734  COLORURINE AMBER*  LABSPEC 1.018  PHURINE 5.0  GLUCOSEU NEGATIVE  HGBUR MODERATE*  BILIRUBINUR SMALL*  KETONESUR NEGATIVE  PROTEINUR 30*  UROBILINOGEN 1.0  NITRITE NEGATIVE  LEUKOCYTESUR NEGATIVE    Micro Results: No results found for this or any previous visit (from the past 240 hour(s)). Studies/Results: Ct Abdomen Pelvis Wo Contrast  01/07/2014   CLINICAL DATA:  Abdominal tenderness and guarding.  EXAM: CT ABDOMEN AND PELVIS WITHOUT CONTRAST  TECHNIQUE: Multidetector CT imaging of the abdomen and pelvis was performed following the standard protocol without intravenous contrast.  COMPARISON:  No priors.  FINDINGS: Lung Bases:  Dependent subsegmental atelectasis in the lower lobes of the lungs bilaterally (right greater than left). Fluid filled patulous esophagus.  Abdomen/Pelvis: The unenhanced appearance of the liver, gallbladder, pancreas, spleen, bilateral adrenal glands and the right kidney is unremarkable. Probable extra renal pelvis in the left kidney. Left kidney is otherwise unremarkable in appearance.  Profound dilatation of the small bowel with multiple air-fluid levels. Small bowel loops measure up to 6.7 cm in diameter. Stomach also appears distended. Distal small bowel appears decompressed, as does the colon. An exact transition point is not confidently identified on today's non contrast CT examination. Trace volume of ascites. No pneumoperitoneum. No definite lymphadenopathy identified within the abdomen or pelvis on today's non contrast CT examination.  Musculoskeletal: There are no aggressive appearing lytic or blastic lesions noted in the visualized portions of the skeleton.  IMPRESSION: 1. Small bowel obstruction. An exact transition point is not confidently identified on today's non contrast CT examination. 2. Trace volume of ascites. 3. No pneumoperitoneum.   Electronically Signed   By: Trudie Reedaniel  Entrikin M.D.   On: 01/07/2014 06:56   Dg Abd Acute W/chest  01/07/2014   CLINICAL DATA:  Nausea and vomiting.  Abdominal pain.  EXAM: ACUTE ABDOMEN SERIES (ABDOMEN 2 VIEW & CHEST 1 VIEW)  COMPARISON:  No priors.  FINDINGS: Lung volumes are normal. No consolidative airspace disease. No pleural effusions. No pneumothorax. No pulmonary nodule or mass noted. Pulmonary vasculature and the cardiomediastinal silhouette are within normal limits.  Numerous dilated loops of small bowel measuring up to 6.9 cm in diameter, with multiple air-fluid levels. Paucity of colonic gas. No pneumoperitoneum.  IMPRESSION: 1. Findings, as above, highly concerning for small bowel obstruction. 2. No pneumoperitoneum at this time. 3. No radiographic  evidence of acute cardiopulmonary disease.   Electronically Signed   By: Trudie Reedaniel  Entrikin M.D.   On: 01/07/2014 04:02   Medications: I have reviewed the patient's current medications. Scheduled Meds: . antiseptic oral rinse  15 mL Mouth Rinse q12n4p  . chlorhexidine  15 mL Mouth Rinse BID  . heparin  5,000 Units Subcutaneous 3 times per day   Continuous Infusions: . sodium chloride 150 mL/hr at 01/08/14 1018   PRN Meds:.morphine injection, ondansetron  Assessment/Plan: The patient is a 63 yo man, presenting with abdominal pain, nausea, and vomiting, found to have a small bowel obstruction.  #Acute small bowel obstruction - SBO seen on CT abdomen. No history of prior abdominal surgeries. Possible preceding viral gastroenteritis. Pain has localized to McBurney's point this am, no rebound or Psoas sign but acute appendicitis can be associated with SBO. Reviewed CT again with radiology, they did note an appendicolith but no frank fat stranding around the appendix, and the terminal ileum is decompressed which doesn't favor appendicitis. We can repeat the scan (with or without IV contrast, depending on Cr this am) to really tease this out. However, surgery is considering operating  in the next 12-24 hours, which would make re-scanning unnecessary, so I will hold off on ordering this study until tomorrow. - Appreciate surgery recs  - Continue NPO, NG tube  - Morphine, zofran for symptomatic relief  - Continue IVF at 150 cc/hr  - Possible surgical exploration if no improvement in next 12-24 hours with conservative management  #Acute kidney injury - The patient presents with a Cr of 8.62, from his last known baseline of 1.2. This likely represents a combination of prerenal azotemia and lisinopril-induced ATN vs. NSAID usage. Labs have not resulted yet this am, will follow those up. - IVF per above  - Holding nephrotoxic meds - Trend BMETs   Creatinine, Ser  Date Value Ref Range Status  01/07/2014  7.05* 0.50 - 1.35 mg/dL Final  1/61/0960 4.54* 0.50 - 1.35 mg/dL Final  0/98/1191 1.2  0.4 - 1.5 mg/dL Final  4/78/2956 2.13  0.4 - 1.5 mg/dL Final    #Elevated anion gap - The patient presents with an AG = 22, likely secondary to uremia. Lactate wnl.  - IVF per above  - Trend BMET   #Hyperbilirubinemia - Bili labs below. This is likely secondary to hypoperfusion from hypovolemia. - CMP this am - RUQ U/S to evaluate gallbladder  Bilirubin     Component Value Date/Time   BILITOT 4.1* 01/07/2014 0912   BILIDIR 2.5* 01/07/2014 0912   IBILI 1.6* 01/07/2014 0912    #Hyponatremia - Likely secondary to hypovolemia, in the setting of poor PO intake, vomiting. - IVF as above  Sodium  Date Value Ref Range Status  01/07/2014 132* 137 - 147 mEq/L Final  01/06/2014 129* 137 - 147 mEq/L Final  11/29/2009 140  135 - 145 mEq/L Final  11/29/2009 138  135 - 145 mEq/L Final  11/29/2009 140  135 - 145 mEq/L Final    #Alcohol use - The patient drinks 2-3 beers/day. His last drink was approximately 1 week ago. Withdrawal unlikely.  #Prophy - Heparin subq, SCDs   Dispo: Disposition is deferred at this time, awaiting improvement of current medical problems.  Anticipated discharge in approximately 1-3 day(s).   The patient does have a current PCP (Dionne Jake Samples, MD) and does not need an [facility] hospital follow-up appointment after discharge.  The patient does not have transportation limitations that hinder transportation to clinic appointments.  .Services Needed at time of discharge: Y = Yes, Blank = No PT:   OT:   RN:   Equipment:   Other:     LOS: 1 day   Vivi Barrack, MD 01/08/2014, 11:02 AM  Vivi Barrack, MD  [email or web address] Pager # [phone] Office # [phone]

## 2014-01-08 NOTE — H&P (Signed)
INTERNAL MEDICINE TEACHING SERVICE Attending Admission Note  Date: 01/08/2014  Patient name: Lonnie MaserCarl Henry  Medical record number: [id number]  Date of birth: [birth year]    I have seen and evaluated Lonnie Maserarl Simkin and discussed their care with the Residency Team.  63 yr old man with HTN, hx CVA, presented with N/V and abdominal pain. He was recently diagnosed with viral gastroenteritis but has worsened. In the ED, he had impressive evidence of an SBO by CT. In addition, he was noted to have a Cr of 8.62 from a last known value of 1.2. He was made NPO and NGT was placed. He was aggressively volume resuscitated. This morning, he feels better Abdomen is less painful and less distended. Describes mostly pain in RLQ today.  Filed Vitals:   01/08/14 1316  BP: 108/65  Pulse: 80  Temp: 99.3 F (37.4 C)  Resp: 20   GEN: AAOx3, NAD HEENT: EOMI, PERRL, no icterus, no adenopathy. CV: S1S2, no m/r/g, RRR PULM: CTA bilat ABD/GI: hypoactive BS, no guarding, but mostly tender at RLQ. Abdomen is softly distended. LE: 2/4 pulses, no c/c/e.  This morning, his Cr has downtrended to 3.42 from admission value of 8.62. This has occurred over a time span of about 34 hrs, which is a bit faster than I expect, but signifies complete renal recovery at this time. His Tbili has increased from 4.7 to 7.3, mostly direct in nature. Given this presentation, I agree with GS involvement and continue conservative management for now. I am interested in obtaining a RUQ when his abdomen has less bowel gas. This is concerning for recent biliary obstruction. Cont NGT, NPO, and IVF. Follow electrolytes.  Jonah BlueAlejandro Dorethia Jeanmarie, DO, FACP [facility] 01/08/2014, 1:45 PM

## 2014-01-08 NOTE — Progress Notes (Signed)
8:31 Lab notified RN of pt's creatinine level decreasing. Lab requested pt to have a HFP and BMP drawn. MD notified.

## 2014-01-08 NOTE — Progress Notes (Signed)
Subjective: Pt feeling ok today, minimal NGT drainage, no flatus or BM.  Still having abdominal pain but improved since yesterday   Objective: Vital signs in last 24 hours: Temp:  [98.5 F (36.9 C)-99.5 F (37.5 C)] 99.2 F (37.3 C) (03/26 0605) Pulse Rate:  [82-95] 82 (03/26 0605) Resp:  [20] 20 (03/26 0605) BP: (109-122)/(59-72) 117/72 mmHg (03/26 0605) SpO2:  [94 %-98 %] 95 % (03/26 0605) Weight:  [213 lb 8 oz (96.843 kg)] 213 lb 8 oz (96.843 kg) (03/25 1244) Last BM Date: 01/07/14  Intake/Output from previous day: 03/25 0701 - 03/26 0700 In: 1000 [I.V.:1000] Out: 3900 [Urine:3900] Intake/Output this shift:    General appearance: alert, cooperative and appears stated age Head: Normocephalic, without obvious abnormality, atraumatic Eyes: +scleral icterus Resp: clear to auscultation bilaterally Chest wall: no tenderness Cardio: regular rate and rhythm, S1, S2 normal, no murmur, click, rub or gallop GI: Mild distension, hypoactive BS, Diffuse abdominal tenderness RUQ > rest, no HSM, no rigidity but minimal guarding Extremities: extremities normal, atraumatic, no cyanosis or edema  Lab Results:   Recent Labs  01/06/14 2345  WBC 10.7*  HGB 11.6*  HCT 31.4*  PLT 288   BMET  Recent Labs  01/06/14 2345 01/07/14 0912  NA 129* 132*  K 4.2 4.0  CL 84* 88*  CO2 23 25  GLUCOSE 161* 112*  BUN 90* 89*  CREATININE 8.62* 7.05*  CALCIUM 9.4 8.6   PT/INR No results found for this basename: LABPROT, INR,  in the last 72 hours ABG  Recent Labs  01/07/14 0526  HCO3 25.6*    Studies/Results: Ct Abdomen Pelvis Wo Contrast  01/07/2014   CLINICAL DATA:  Abdominal tenderness and guarding.  EXAM: CT ABDOMEN AND PELVIS WITHOUT CONTRAST  TECHNIQUE: Multidetector CT imaging of the abdomen and pelvis was performed following the standard protocol without intravenous contrast.  COMPARISON:  No priors.  FINDINGS: Lung Bases: Dependent subsegmental atelectasis in the lower  lobes of the lungs bilaterally (right greater than left). Fluid filled patulous esophagus.  Abdomen/Pelvis: The unenhanced appearance of the liver, gallbladder, pancreas, spleen, bilateral adrenal glands and the right kidney is unremarkable. Probable extra renal pelvis in the left kidney. Left kidney is otherwise unremarkable in appearance.  Profound dilatation of the small bowel with multiple air-fluid levels. Small bowel loops measure up to 6.7 cm in diameter. Stomach also appears distended. Distal small bowel appears decompressed, as does the colon. An exact transition point is not confidently identified on today's non contrast CT examination. Trace volume of ascites. No pneumoperitoneum. No definite lymphadenopathy identified within the abdomen or pelvis on today's non contrast CT examination.  Musculoskeletal: There are no aggressive appearing lytic or blastic lesions noted in the visualized portions of the skeleton.  IMPRESSION: 1. Small bowel obstruction. An exact transition point is not confidently identified on today's non contrast CT examination. 2. Trace volume of ascites. 3. No pneumoperitoneum.   Electronically Signed   By: Trudie Reedaniel  Entrikin M.D.   On: 01/07/2014 06:56   Dg Abd Acute W/chest  01/07/2014   CLINICAL DATA:  Nausea and vomiting.  Abdominal pain.  EXAM: ACUTE ABDOMEN SERIES (ABDOMEN 2 VIEW & CHEST 1 VIEW)  COMPARISON:  No priors.  FINDINGS: Lung volumes are normal. No consolidative airspace disease. No pleural effusions. No pneumothorax. No pulmonary nodule or mass noted. Pulmonary vasculature and the cardiomediastinal silhouette are within normal limits.  Numerous dilated loops of small bowel measuring up to 6.9 cm in diameter, with multiple air-fluid  levels. Paucity of colonic gas. No pneumoperitoneum.  IMPRESSION: 1. Findings, as above, highly concerning for small bowel obstruction. 2. No pneumoperitoneum at this time. 3. No radiographic evidence of acute cardiopulmonary disease.    Electronically Signed   By: Trudie Reed M.D.   On: 01/07/2014 04:02    Anti-infectives: Anti-infectives   None      Assessment/Plan:  LOS: 1 day  SBO - Conservative management.  Pt still has not passed flatus or BM and his NGT drainage has been minimal.  Increase flush to tube and continue IVF.  Continue NPO, antiemetics, and pain medication.  If no improvement over next 12-24 hours, may require surgical intervention.  Continue Heparin SQ and SCDs  AKI - Most likely ATN and pre-renal azotemia secondary to dehydration/medications, continue fluids Hyperbilirubinemia, w/ elevated conjugated and unconjugated - May be secondary to hypoperfusion vs possible CBD blockage secondary to mass vs stone or component of Gilbert's syndrome (unconjugated portion).  Could consider RUQ [place] and possible hemolysis markers.  Dispo: Possible surgical exploration if no improvement in next 12-24 hours with conservative management and improvement with kidney function.   Lonnie Henry 01/08/2014

## 2014-01-08 NOTE — Progress Notes (Signed)
Gastric tube flushed with 60 cc of air. Filter is dry. Pt tolerated well.

## 2014-01-08 NOTE — Progress Notes (Signed)
ANTIBIOTIC CONSULT NOTE - INITIAL  Pharmacy Consult for zosyn Indication: intra-abdominal infection  No Known Allergies  Patient Measurements: Height: 6\' 2"  (188 cm) Weight: 213 lb 8 oz (96.843 kg) IBW/kg (Calculated) : 82.2  Vital Signs: Temp: 99.3 F (37.4 C) (03/26 1316) Temp src: Oral (03/26 1316) BP: 108/65 mmHg (03/26 1316) Pulse Rate: 80 (03/26 1316) Intake/Output from previous day: 03/25 0701 - 03/26 0700 In: 1000 [I.V.:1000] Out: 3900 [Urine:3900] Intake/Output from this shift:    Labs:  Recent Labs  01/06/14 2345 01/07/14 0734 01/07/14 0912 01/08/14 1033  WBC 10.7*  --   --   --   HGB 11.6*  --   --   --   PLT 288  --   --   --   LABCREA  --  213.80  --   --   CREATININE 8.62*  --  7.05* 3.42*   Estimated Creatinine Clearance: 26.4 ml/min (by C-G formula based on Cr of 3.42). No results found for this basename: VANCOTROUGH, VANCOPEAK, VANCORANDOM, GENTTROUGH, GENTPEAK, GENTRANDOM, TOBRATROUGH, TOBRAPEAK, TOBRARND, AMIKACINPEAK, AMIKACINTROU, AMIKACIN,  in the last 72 hours   Microbiology: No results found for this or any previous visit (from the past 720 hour(s)).  Medical History: Past Medical History  Diagnosis Date  . Hypertension     Dr. Angela Nevinion Homes (PCP)  . Hypercholesterolemia   . [facility]  . SBO (small bowel obstruction) 01/07/2014    Medications:  Prescriptions prior to admission  Medication Sig Dispense Refill  . co-enzyme Q-10 30 MG capsule Take 30 mg by mouth daily.      Marland Kitchen. lisinopril (PRINIVIL,ZESTRIL) 20 MG tablet Take 30-40 mg by mouth daily.      . Multiple Vitamin (MULTIVITAMIN WITH MINERALS) TABS tablet Take 1 tablet by mouth daily.      . naproxen sodium (ANAPROX) 220 MG tablet Take 440 mg by mouth daily as needed (for pain).      . Omega-3 Fatty Acids (FISH OIL) 1000 MG CAPS Take 1,000 mg by mouth daily.      Marland Kitchen. omeprazole (PRILOSEC) 20 MG capsule Take 20 mg by mouth daily.      . simvastatin  (ZOCOR) 20 MG tablet Take 20 mg by mouth daily at 6 PM.        Assessment: 63 yo male with SBO and noted with intra-abdominal infection to start zosyn. Patient noted for possible surgical exploration. Patient noted with AKI and SCr=3.42 (trend down) and CrCl ~ 25.   3/26 zosyn>>  Plan:  -Zosyn 3.375gm IV q8h -Will follow renal function, cultures and clinical progress  Harland Germanndrew Luccas Towell, Pharm D 01/08/2014 2:59 PM

## 2014-01-08 NOTE — Progress Notes (Signed)
Just an update to my progress note for the day, patient's am labs finally resulted. Cr downtrended significantly from 7> 3.5 with IV fluid resuscitation. No electrolyte abnormalities.  His total bilirubin has increased from 4.1 to 7.3. I have added on a direct bilirubin to this morning's labs (yesterday he had more of a direct bilirubinemia, suggesting possible obstruction of biliary flow). Ct did not show cholelithiasis.  Agree with RUQ U/S, but bowel gas may make this a difficult study.  Plan will be to observe for another 12-24 hours and either ex-lap (per surgery) or re-scan.   CMP     Component Value Date/Time   NA 143 01/08/2014 1033   K 3.9 01/08/2014 1033   CL 102 01/08/2014 1033   CO2 24 01/08/2014 1033   GLUCOSE 94 01/08/2014 1033   BUN 77* 01/08/2014 1033   CREATININE 3.42* 01/08/2014 1033   CALCIUM 9.0 01/08/2014 1033   PROT 7.3 01/08/2014 1033   ALBUMIN 2.5* 01/08/2014 1033   AST 49* 01/08/2014 1033   ALT 61* 01/08/2014 1033   ALKPHOS 75 01/08/2014 1033   BILITOT 7.3* 01/08/2014 1033   GFRNONAA 18* 01/08/2014 1033   GFRAA 21* 01/08/2014 1033     Vivi BarrackSarah Bronnie Vasseur, MD  [email or web address] Pager # [phone] Office # [phone]

## 2014-01-08 NOTE — Care Management Note (Unsigned)
    Page 1 of 1   01/08/2014     4:23:45 PM   CARE MANAGEMENT NOTE 01/08/2014  Patient:  Lonnie Henry,Lonnie   Account Number:  [id number]  Date Initiated:  01/08/2014  Documentation initiated by:  Letha CapeAYLOR,Lockie Bothun  Subjective/Objective Assessment:   dx sbo  admit- from home     Action/Plan:   ngtube- if no improvement may need surgical intervention   Anticipated DC Date:  01/10/2014   Anticipated DC Plan:  HOME/SELF CARE      DC Planning Services  CM consult      Choice offered to / List presented to:             Status of service:  In process, will continue to follow Medicare Important Message given?   (If response is "NO", the following Medicare IM given date fields will be blank) Date Medicare IM given:   Date Additional Medicare IM given:    Discharge Disposition:    Per UR Regulation:    If discussed at Long Length of Stay Meetings, dates discussed:    Comments:

## 2014-01-08 NOTE — Progress Notes (Signed)
Less pain but still no flatus. Mild TTP RLQ. If no improvement, plan ex lap tomorrow. We discussed this at length. CRT improving. Patient examined and I agree with the assessment and plan  Violeta GelinasBurke Ylonda Storr, MD, MPH, FACS Trauma: [phone] General Surgery: [phone]  01/08/2014 2:58 PM

## 2014-01-08 NOTE — Progress Notes (Signed)
Gastric tube flushed with 60 cc of air. Filter is dry.

## 2014-01-09 ENCOUNTER — Encounter (HOSPITAL_COMMUNITY): Payer: BC Managed Care – PPO | Admitting: Certified Registered"

## 2014-01-09 ENCOUNTER — Inpatient Hospital Stay (HOSPITAL_COMMUNITY): Payer: BC Managed Care – PPO | Admitting: Certified Registered"

## 2014-01-09 ENCOUNTER — Inpatient Hospital Stay (HOSPITAL_COMMUNITY): Payer: BC Managed Care – PPO

## 2014-01-09 ENCOUNTER — Encounter (HOSPITAL_COMMUNITY): Payer: Self-pay | Admitting: Certified Registered"

## 2014-01-09 ENCOUNTER — Encounter (HOSPITAL_COMMUNITY)
Admission: EM | Disposition: A | Discharge status: Home / Self Care | Payer: Self-pay | Source: Home / Self Care | Attending: Internal Medicine

## 2014-01-09 DIAGNOSIS — K3580 Unspecified acute appendicitis: Secondary | ICD-10-CM

## 2014-01-09 HISTORY — PX: APPENDECTOMY: SHX54

## 2014-01-09 HISTORY — PX: LAPAROTOMY: SHX154

## 2014-01-09 HISTORY — PX: LYSIS OF ADHESION: SHX5961

## 2014-01-09 HISTORY — PX: INCISION AND DRAINAGE ABSCESS: SHX5864

## 2014-01-09 LAB — CBC
HCT: 25 % — ABNORMAL LOW (ref 39.0–52.0)
HEMOGLOBIN: 8.9 g/dL — AB (ref 13.0–17.0)
MCH: 33.1 pg (ref 26.0–34.0)
MCHC: 35.6 g/dL (ref 30.0–36.0)
MCV: 92.9 fL (ref 78.0–100.0)
PLATELETS: 368 10*3/uL (ref 150–400)
RBC: 2.69 MIL/uL — AB (ref 4.22–5.81)
RDW: 12.8 % (ref 11.5–15.5)
WBC: 12.4 10*3/uL — AB (ref 4.0–10.5)

## 2014-01-09 LAB — COMPREHENSIVE METABOLIC PANEL
ALBUMIN: 2.4 g/dL — AB (ref 3.5–5.2)
ALT: 59 U/L — ABNORMAL HIGH (ref 0–53)
AST: 42 U/L — ABNORMAL HIGH (ref 0–37)
Alkaline Phosphatase: 83 U/L (ref 39–117)
BILIRUBIN TOTAL: 6.3 mg/dL — AB (ref 0.3–1.2)
BUN: 61 mg/dL — ABNORMAL HIGH (ref 6–23)
CALCIUM: 9.2 mg/dL (ref 8.4–10.5)
CO2: 24 mEq/L (ref 19–32)
CREATININE: 2.34 mg/dL — AB (ref 0.50–1.35)
Chloride: 112 mEq/L (ref 96–112)
GFR calc Af Amer: 33 mL/min — ABNORMAL LOW (ref >90)
GFR calc non Af Amer: 28 mL/min — ABNORMAL LOW (ref >90)
Glucose, Bld: 106 mg/dL — ABNORMAL HIGH (ref 70–99)
Potassium: 4.2 mEq/L (ref 3.7–5.3)
Sodium: 150 mEq/L — ABNORMAL HIGH (ref 137–147)
Total Protein: 7.2 g/dL (ref 6.0–8.3)

## 2014-01-09 LAB — TECHNOLOGIST SMEAR REVIEW: Tech Review: INCREASED

## 2014-01-09 LAB — GLUCOSE, CAPILLARY
Glucose-Capillary: 107 mg/dL — ABNORMAL HIGH (ref 70–99)
Glucose-Capillary: 89 mg/dL (ref 70–99)
Glucose-Capillary: 92 mg/dL (ref 70–99)
Glucose-Capillary: 98 mg/dL (ref 70–99)

## 2014-01-09 LAB — HIV ANTIBODY (ROUTINE TESTING W REFLEX): HIV: NONREACTIVE

## 2014-01-09 LAB — SURGICAL PCR SCREEN
MRSA, PCR: NEGATIVE
Staphylococcus aureus: POSITIVE — AB

## 2014-01-09 LAB — BILIRUBIN, DIRECT: Bilirubin, Direct: 4.3 mg/dL — ABNORMAL HIGH (ref 0.0–0.3)

## 2014-01-09 LAB — PROTIME-INR
INR: 1.53 — ABNORMAL HIGH (ref 0.00–1.49)
Prothrombin Time: 18 seconds — ABNORMAL HIGH (ref 11.6–15.2)

## 2014-01-09 LAB — LACTIC ACID, PLASMA: LACTIC ACID, VENOUS: 0.8 mmol/L (ref 0.5–2.2)

## 2014-01-09 SURGERY — LAPAROTOMY, EXPLORATORY
Anesthesia: General | Site: Abdomen

## 2014-01-09 MED ORDER — ONDANSETRON HCL 4 MG/2ML IJ SOLN
INTRAMUSCULAR | Status: AC
  Filled 2014-01-09: qty 2

## 2014-01-09 MED ORDER — NEOSTIGMINE METHYLSULFATE 1 MG/ML IJ SOLN
INTRAMUSCULAR | Status: AC
  Filled 2014-01-09: qty 10

## 2014-01-09 MED ORDER — ONDANSETRON HCL 4 MG/2ML IJ SOLN: 4.0000 mg | Freq: Once | INTRAMUSCULAR | Status: DC | PRN

## 2014-01-09 MED ORDER — ONDANSETRON HCL 4 MG/2ML IJ SOLN
INTRAMUSCULAR | Status: DC | PRN
  Administered 2014-01-09: 4 mg via INTRAVENOUS

## 2014-01-09 MED ORDER — ROCURONIUM BROMIDE 100 MG/10ML IV SOLN
INTRAVENOUS | Status: DC | PRN
  Administered 2014-01-09: 40 mg via INTRAVENOUS
  Administered 2014-01-09: 10 mg via INTRAVENOUS

## 2014-01-09 MED ORDER — GLYCOPYRROLATE 0.2 MG/ML IJ SOLN
INTRAMUSCULAR | Status: AC
  Filled 2014-01-09: qty 2

## 2014-01-09 MED ORDER — MIDAZOLAM HCL 2 MG/2ML IJ SOLN
INTRAMUSCULAR | Status: AC
  Filled 2014-01-09: qty 2

## 2014-01-09 MED ORDER — PIPERACILLIN-TAZOBACTAM 3.375 G IVPB 30 MIN
3.3750 g | INTRAVENOUS | Status: AC
  Administered 2014-01-09: 3.375 g via INTRAVENOUS
  Filled 2014-01-09: qty 50

## 2014-01-09 MED ORDER — OXYCODONE HCL 5 MG PO TABS: 5.0000 mg | ORAL_TABLET | Freq: Once | ORAL | Status: DC | PRN

## 2014-01-09 MED ORDER — SODIUM CHLORIDE 0.9 % IJ SOLN
9.0000 mL | INTRAMUSCULAR | Status: DC | PRN
Start: 2014-01-09 — End: 2014-01-16
  Administered 2014-01-13: 9 mL via INTRAVENOUS

## 2014-01-09 MED ORDER — LACTATED RINGERS IV SOLN
INTRAVENOUS | Status: DC | PRN
  Administered 2014-01-09 (×2): via INTRAVENOUS

## 2014-01-09 MED ORDER — ACETAMINOPHEN 325 MG PO TABS: 325.0000 mg | ORAL_TABLET | ORAL | Status: DC | PRN

## 2014-01-09 MED ORDER — DIPHENHYDRAMINE HCL 12.5 MG/5ML PO ELIX
12.5000 mg | ORAL_SOLUTION | Freq: Four times a day (QID) | ORAL | Status: DC | PRN
  Filled 2014-01-09: qty 5

## 2014-01-09 MED ORDER — SUFENTANIL CITRATE 50 MCG/ML IV SOLN
INTRAVENOUS | Status: DC | PRN
  Administered 2014-01-09: 20 ug via INTRAVENOUS
  Administered 2014-01-09 (×3): 10 ug via INTRAVENOUS

## 2014-01-09 MED ORDER — HYDROMORPHONE HCL PF 1 MG/ML IJ SOLN
0.2500 mg | INTRAMUSCULAR | Status: DC | PRN
  Administered 2014-01-09 (×4): 0.5 mg via INTRAVENOUS

## 2014-01-09 MED ORDER — HYDROMORPHONE 0.3 MG/ML IV SOLN
INTRAVENOUS | Status: DC
  Administered 2014-01-09: 2.4 mg via INTRAVENOUS
  Administered 2014-01-09 (×2): via INTRAVENOUS
  Administered 2014-01-10: 0.3 mg via INTRAVENOUS
  Administered 2014-01-10: 5.1 mg via INTRAVENOUS
  Administered 2014-01-11: 1.3 mg via INTRAVENOUS
  Administered 2014-01-11: 3.3 mg via INTRAVENOUS
  Administered 2014-01-11: 4.5 mg via INTRAVENOUS
  Administered 2014-01-11: 0.3 mg via INTRAVENOUS
  Administered 2014-01-11: 4.5 mg via INTRAVENOUS
  Administered 2014-01-12: 2.1 mg via INTRAVENOUS
  Administered 2014-01-12: 1.4 mg via INTRAVENOUS
  Administered 2014-01-12: 2.1 mg via INTRAVENOUS
  Administered 2014-01-12: 1.5 mg via INTRAVENOUS
  Administered 2014-01-12: 2.42 mg via INTRAVENOUS
  Administered 2014-01-13: 1.8 mg via INTRAVENOUS
  Administered 2014-01-13: 22:00:00 via INTRAVENOUS
  Administered 2014-01-13: 0.6 mg via INTRAVENOUS
  Administered 2014-01-13: 1.4 mg via INTRAVENOUS
  Administered 2014-01-13: 0.6 mg via INTRAVENOUS
  Administered 2014-01-13: 1.8 mg via INTRAVENOUS
  Administered 2014-01-13: 1.5 mg via INTRAVENOUS
  Administered 2014-01-14 (×2): 0.6 mg via INTRAVENOUS
  Administered 2014-01-14: 1.5 mg via INTRAVENOUS
  Administered 2014-01-14: 0.9 mg via INTRAVENOUS
  Administered 2014-01-15: 2.1 mg via INTRAVENOUS
  Administered 2014-01-15: 19:00:00 via INTRAVENOUS
  Administered 2014-01-15: 0.9 mg via INTRAVENOUS
  Administered 2014-01-15: 1.5 mg via INTRAVENOUS
  Administered 2014-01-15: 2.4 mg via INTRAVENOUS
  Administered 2014-01-15: 1.2 mg via INTRAVENOUS
  Administered 2014-01-15: 1.5 mg via INTRAVENOUS
  Administered 2014-01-15: via INTRAVENOUS
  Administered 2014-01-16: 1.2 mg via INTRAVENOUS
  Administered 2014-01-16: 0.3 mg via INTRAVENOUS
  Administered 2014-01-16: 1.5 mg via INTRAVENOUS
  Filled 2014-01-09 (×9): qty 25

## 2014-01-09 MED ORDER — SUCCINYLCHOLINE CHLORIDE 20 MG/ML IJ SOLN
INTRAMUSCULAR | Status: DC | PRN
  Administered 2014-01-09: 80 mg via INTRAVENOUS

## 2014-01-09 MED ORDER — SUFENTANIL CITRATE 50 MCG/ML IV SOLN
INTRAVENOUS | Status: AC
  Filled 2014-01-09: qty 1

## 2014-01-09 MED ORDER — OXYCODONE HCL 5 MG/5ML PO SOLN: 5.0000 mg | Freq: Once | ORAL | Status: DC | PRN

## 2014-01-09 MED ORDER — HYDROMORPHONE HCL PF 1 MG/ML IJ SOLN
INTRAMUSCULAR | Status: AC
  Administered 2014-01-09: 0.5 mg via INTRAVENOUS
  Filled 2014-01-09: qty 1

## 2014-01-09 MED ORDER — PROPOFOL 10 MG/ML IV BOLUS
INTRAVENOUS | Status: AC
  Filled 2014-01-09: qty 20

## 2014-01-09 MED ORDER — MIDAZOLAM HCL 5 MG/5ML IJ SOLN
INTRAMUSCULAR | Status: DC | PRN
  Administered 2014-01-09: 2 mg via INTRAVENOUS

## 2014-01-09 MED ORDER — DIPHENHYDRAMINE HCL 50 MG/ML IJ SOLN: 12.5000 mg | Freq: Four times a day (QID) | INTRAMUSCULAR | Status: DC | PRN

## 2014-01-09 MED ORDER — PROPOFOL 10 MG/ML IV BOLUS
INTRAVENOUS | Status: DC | PRN
  Administered 2014-01-09: 200 mg via INTRAVENOUS

## 2014-01-09 MED ORDER — ROCURONIUM BROMIDE 50 MG/5ML IV SOLN
INTRAVENOUS | Status: AC
  Filled 2014-01-09: qty 1

## 2014-01-09 MED ORDER — CHLORHEXIDINE GLUCONATE CLOTH 2 % EX PADS
6.0000 | MEDICATED_PAD | Freq: Every day | CUTANEOUS | Status: AC
  Administered 2014-01-09 – 2014-01-13 (×5): 6 via TOPICAL

## 2014-01-09 MED ORDER — LIDOCAINE HCL (CARDIAC) 20 MG/ML IV SOLN
INTRAVENOUS | Status: DC | PRN
  Administered 2014-01-09: 80 mg via INTRAVENOUS

## 2014-01-09 MED ORDER — ONDANSETRON HCL 4 MG/2ML IJ SOLN: 4.0000 mg | Freq: Four times a day (QID) | INTRAMUSCULAR | Status: DC | PRN

## 2014-01-09 MED ORDER — 0.9 % SODIUM CHLORIDE (POUR BTL) OPTIME
TOPICAL | Status: DC | PRN
  Administered 2014-01-09 (×2): 2000 mL

## 2014-01-09 MED ORDER — GLYCOPYRROLATE 0.2 MG/ML IJ SOLN
INTRAMUSCULAR | Status: DC | PRN
Start: 2014-01-09 — End: 2014-01-09
  Administered 2014-01-09: 0.4 mg via INTRAVENOUS

## 2014-01-09 MED ORDER — LIDOCAINE HCL (CARDIAC) 20 MG/ML IV SOLN
INTRAVENOUS | Status: AC
  Filled 2014-01-09: qty 5

## 2014-01-09 MED ORDER — NEOSTIGMINE METHYLSULFATE 1 MG/ML IJ SOLN
INTRAMUSCULAR | Status: DC | PRN
  Administered 2014-01-09: 3 mg via INTRAVENOUS

## 2014-01-09 MED ORDER — MUPIROCIN 2 % EX OINT
1.0000 "application " | TOPICAL_OINTMENT | Freq: Two times a day (BID) | CUTANEOUS | Status: AC
  Administered 2014-01-09 – 2014-01-13 (×10): 1 via NASAL
  Filled 2014-01-09 (×3): qty 22

## 2014-01-09 MED ORDER — HYDROMORPHONE 0.3 MG/ML IV SOLN
INTRAVENOUS | Status: AC
  Administered 2014-01-10: 3 mg via INTRAVENOUS
  Filled 2014-01-09: qty 25

## 2014-01-09 MED ORDER — NALOXONE HCL 0.4 MG/ML IJ SOLN: 0.4000 mg | INTRAMUSCULAR | Status: DC | PRN

## 2014-01-09 MED ORDER — ACETAMINOPHEN 160 MG/5ML PO SOLN
325.0000 mg | ORAL | Status: DC | PRN
  Filled 2014-01-09: qty 20.3

## 2014-01-09 SURGICAL SUPPLY — 56 items
BLADE SURG ROTATE 9660 (MISCELLANEOUS) ×3 IMPLANT
CANISTER SUCTION 2500CC (MISCELLANEOUS) ×3 IMPLANT
CHLORAPREP W/TINT 26ML (MISCELLANEOUS) ×3 IMPLANT
COVER MAYO STAND STRL (DRAPES) IMPLANT
COVER SURGICAL LIGHT HANDLE (MISCELLANEOUS) ×3 IMPLANT
DRAIN CHANNEL 19F RND (DRAIN) ×3 IMPLANT
DRAPE LAPAROSCOPIC ABDOMINAL (DRAPES) ×3 IMPLANT
DRAPE PROXIMA HALF (DRAPES) IMPLANT
DRAPE UTILITY 15X26 W/TAPE STR (DRAPE) ×6 IMPLANT
DRAPE WARM FLUID 44X44 (DRAPE) ×3 IMPLANT
DRSG OPSITE POSTOP 4X10 (GAUZE/BANDAGES/DRESSINGS) IMPLANT
DRSG OPSITE POSTOP 4X8 (GAUZE/BANDAGES/DRESSINGS) IMPLANT
ELECT BLADE 6.5 EXT (BLADE) IMPLANT
ELECT CAUTERY BLADE 6.4 (BLADE) ×6 IMPLANT
ELECT REM PT RETURN 9FT ADLT (ELECTROSURGICAL) ×3
ELECTRODE REM PT RTRN 9FT ADLT (ELECTROSURGICAL) ×1 IMPLANT
EVACUATOR SILICONE 100CC (DRAIN) ×3 IMPLANT
GAUZE PACKING IODOFORM 1/4X15 (GAUZE/BANDAGES/DRESSINGS) ×3 IMPLANT
GLOVE BIO SURGEON STRL SZ7.5 (GLOVE) ×6 IMPLANT
GLOVE BIO SURGEON STRL SZ8 (GLOVE) ×6 IMPLANT
GLOVE BIOGEL PI IND STRL 7.5 (GLOVE) ×4 IMPLANT
GLOVE BIOGEL PI IND STRL 8 (GLOVE) ×1 IMPLANT
GLOVE BIOGEL PI INDICATOR 7.5 (GLOVE) ×8
GLOVE BIOGEL PI INDICATOR 8 (GLOVE) ×2
GLOVE ECLIPSE 7.5 STRL STRAW (GLOVE) ×6 IMPLANT
GOWN STRL REUS W/ TWL LRG LVL3 (GOWN DISPOSABLE) ×2 IMPLANT
GOWN STRL REUS W/ TWL XL LVL3 (GOWN DISPOSABLE) ×1 IMPLANT
GOWN STRL REUS W/TWL LRG LVL3 (GOWN DISPOSABLE) ×4
GOWN STRL REUS W/TWL XL LVL3 (GOWN DISPOSABLE) ×2
KIT BASIN OR (CUSTOM PROCEDURE TRAY) ×3 IMPLANT
KIT ROOM TURNOVER OR (KITS) ×3 IMPLANT
LIGASURE IMPACT 36 18CM CVD LR (INSTRUMENTS) IMPLANT
NS IRRIG 1000ML POUR BTL (IV SOLUTION) ×12 IMPLANT
PACK GENERAL/GYN (CUSTOM PROCEDURE TRAY) ×3 IMPLANT
PAD ARMBOARD 7.5X6 YLW CONV (MISCELLANEOUS) ×3 IMPLANT
PENCIL BUTTON HOLSTER BLD 10FT (ELECTRODE) IMPLANT
RELOAD STAPLER LINEAR PROX 30 (STAPLE) ×1 IMPLANT
SPECIMEN JAR LARGE (MISCELLANEOUS) IMPLANT
SPONGE GAUZE 4X4 12PLY (GAUZE/BANDAGES/DRESSINGS) ×3 IMPLANT
SPONGE LAP 18X18 X RAY DECT (DISPOSABLE) IMPLANT
STAPLER RELOAD LINEAR PROX 30 (STAPLE) ×3
STAPLER VISISTAT 35W (STAPLE) ×3 IMPLANT
SUCTION POOLE TIP (SUCTIONS) ×3 IMPLANT
SUT ETHILON 3 0 PS 1 (SUTURE) ×3 IMPLANT
SUT PDS AB 1 TP1 96 (SUTURE) ×6 IMPLANT
SUT SILK 2 0 SH CR/8 (SUTURE) ×3 IMPLANT
SUT SILK 2 0 TIES 10X30 (SUTURE) ×3 IMPLANT
SUT SILK 3 0 SH CR/8 (SUTURE) ×3 IMPLANT
SUT SILK 3 0 TIES 10X30 (SUTURE) ×3 IMPLANT
TAPE CLOTH SURG 6X10 WHT LF (GAUZE/BANDAGES/DRESSINGS) ×3 IMPLANT
TOWEL OR 17X26 10 PK STRL BLUE (TOWEL DISPOSABLE) ×3 IMPLANT
TOWEL OR NON WOVEN STRL DISP B (DISPOSABLE) ×6 IMPLANT
TRAY FOLEY CATH 16FRSI W/METER (SET/KITS/TRAYS/PACK) ×3 IMPLANT
TUBE CONNECTING 12'X1/4 (SUCTIONS)
TUBE CONNECTING 12X1/4 (SUCTIONS) IMPLANT
YANKAUER SUCT BULB TIP NO VENT (SUCTIONS) ×6 IMPLANT

## 2014-01-09 NOTE — Transfer of Care (Signed)
Immediate Anesthesia Transfer of Care Note  Patient: Kathaleen MaserCarl Darrow  Procedure(s) Performed: Procedure(s): EXPLORATORY LAPAROTOMY (N/A) LYSIS OF ADHESION (N/A) APPENDECTOMY (N/A) DRAINAGE OF INTRAABDOMINAL ABSCESS (N/A)  Patient Location: PACU  Anesthesia Type:General  Level of Consciousness: awake  Airway & Oxygen Therapy: Patient Spontanous Breathing and Patient connected to nasal cannula oxygen  Post-op Assessment: Report given to PACU RN, Post -op Vital signs reviewed and stable and Patient moving all extremities  Post vital signs: Reviewed and stable  Complications: No apparent anesthesia complications

## 2014-01-09 NOTE — Op Note (Addendum)
01/07/2014 - 01/09/2014  2:29 PM  PATIENT:  Kathaleen Maserarl Dancer  63 y.o. male  PRE-OPERATIVE DIAGNOSIS:  Small bowel obstruction  POST-OPERATIVE DIAGNOSIS:  Perforated appendix, Intraabdominal abscess, small bowel obstruction  PROCEDURE:  Procedure(s): EXPLORATORY LAPAROTOMY LYSIS OF ADHESION X15MIN APPENDECTOMY DRAINAGE OF INTRAABDOMINAL ABSCESS  SURGEON:  Surgeon(s): Liz MaladyBurke E Halcyon Heck, MD  ASSISTANTS: none   ANESTHESIA:   general  EBL:  Total I/O In: 1500 [I.V.:1500] Out: 1350 [Urine:1100; Emesis/NG output:250]  BLOOD ADMINISTERED:none  DRAINS: (1) Jackson-Pratt drain(s) with closed bulb suction in the RLQ going down into the pelvis   SPECIMEN:  Excision  DISPOSITION OF SPECIMEN:  PATHOLOGY  COUNTS:  YES  DICTATION: .Dragon Dictation  Patient is brought for laparotomy for small bowel obstruction. He was identified in the preop holding area. He is on IV antibiotic protocol. Informed consent was obtained. He was brought to the operating room and general endotracheal anesthesia was administered by the anesthesia staff. Foley catheter was placed by nursing.Abdomen was prepped and draped in sterile fashion. We did time out procedure. Limited midline incision was made. Subcutaneous tissues were dissected down revealing the anterior fascia. This was divided along the midline. Peritoneal cavity was entered under direct vision carefully. Peritoneum was opened the length of the incision. Exploration revealed very dilated proximal small bowel with a few small serosal tears. Bowel was followed more distally. There were adhesions down in the pelvis. These were gradually broken up relieving the obstruction. 15minutes LOA. There was an abscess cavity in the pelvis secondary to ruptured appendicitis. Cultures were taken. It was thoroughly drained.His appendix was adherent to the terminal ileum and frankly ruptured. The appendix was bluntly dissected away from the terminal ileum, the mesoappendix was  taken between clamps and tied securely with silk suture. The base the appendix was intact on the cecum. This was divided with an TA-30 stapler. Appendix was sent to pathology. Pelvis and remainder of the abdomen were irrigated with multiple liters of warm saline. 19 [place] Blake drain was placed in the pelvis and secured to the skin with nylon suture.Bowel was run from the ligament of Treitz down to the terminal ileum and it was now completely free. Proximal bowel was milked back to decompress it. Nasogastric tube was adjusted in position. 3 serosal tears as observed earlier were repaired with interrupted Lembert sutures. There were no enterotomies. Bowel was returned to anatomic position. Fascia was closed with 2 lengths of #1 running PDS from each end tied in the middle. Subcutaneous tissues were irrigated and skin was closed with staples. Quarter-inch iodoform wicks were placed in the wound and a sterile dressing was applied. All counts were correct. Patient tolerated procedure well without apparent complication and was taken to recovery in stable condition.  PATIENT DISPOSITION:  PACU - hemodynamically stable.   Delay start of Pharmacological VTE agent (>24hrs) due to surgical blood loss or risk of bleeding:  no  Violeta GelinasBurke Nathan Moctezuma, MD, MPH, FACS Pager: [phone]  3/27/20152:29 PM

## 2014-01-09 NOTE — Anesthesia Procedure Notes (Signed)
Procedure Name: Intubation Date/Time: 01/09/2014 1:21 PM Performed by: Charm BargesBUTLER, Tharun Cappella R Pre-anesthesia Checklist: Patient identified, Emergency Drugs available, Suction available, Patient being monitored and Timeout performed Patient Re-evaluated:Patient Re-evaluated prior to inductionOxygen Delivery Method: Circle system utilized Preoxygenation: Pre-oxygenation with 100% oxygen Intubation Type: IV induction, Rapid sequence and Cricoid Pressure applied Ventilation: Mask ventilation without difficulty Laryngoscope Size: Mac and 4 Grade View: Grade I Tube type: Oral Tube size: 8.0 mm Number of attempts: 1 Airway Equipment and Method: Stylet Placement Confirmation: ETT inserted through vocal cords under direct vision,  positive ETCO2 and breath sounds checked- equal and bilateral Secured at: 22 cm Tube secured with: Tape Dental Injury: Teeth and Oropharynx as per pre-operative assessment

## 2014-01-09 NOTE — OR Nursing (Signed)
Late entry at 1455 to document positioning.

## 2014-01-09 NOTE — Anesthesia Preprocedure Evaluation (Addendum)
Anesthesia Evaluation  Patient identified by MRN, date of birth, ID band Patient awake    Reviewed: Allergy & Precautions, H&P , NPO status , Patient's Chart, lab work & pertinent test results  History of Anesthesia Complications Negative for: history of anesthetic complications  Airway Mallampati: II TM Distance: >3 FB Neck ROM: Full    Dental  (+) Teeth Intact, Dental Advisory Given   Pulmonary neg pulmonary ROS,  breath sounds clear to auscultation        Cardiovascular hypertension, Pt. on medications - angina- Past MI and - CHF - dysrhythmias - Valvular Problems/MurmursRhythm:Regular Rate:Normal     Neuro/Psych CVA, No Residual Symptoms negative psych ROS   GI/Hepatic Neg liver ROS, Bowel obstruction   Endo/Other    Renal/GU ARFRenal disease     Musculoskeletal   Abdominal   Peds  Hematology  (+) anemia ,   Anesthesia Other Findings   Reproductive/Obstetrics                        Anesthesia Physical Anesthesia Plan  ASA: II  Anesthesia Plan: General   Post-op Pain Management:    Induction: Intravenous, Rapid sequence and Cricoid pressure planned  Airway Management Planned: Oral ETT  Additional Equipment: None  Intra-op Plan:   Post-operative Plan: Extubation in OR  Informed Consent: I have reviewed the patients History and Physical, chart, labs and discussed the procedure including the risks, benefits and alternatives for the proposed anesthesia with the patient or authorized representative who has indicated his/her understanding and acceptance.   Dental advisory given  Plan Discussed with: CRNA, Anesthesiologist and Surgeon  Anesthesia Plan Comments:        Anesthesia Quick Evaluation

## 2014-01-09 NOTE — Progress Notes (Signed)
  Subjective: No flatus nor BM  Objective: Vital signs in last 24 hours: Temp:  [98.6 F (37 C)-99.8 F (37.7 C)] 98.8 F (37.1 C) (03/27 0442) Pulse Rate:  [79-92] 79 (03/27 0442) Resp:  [18-24] 18 (03/27 0442) BP: (108-151)/(65-80) 117/76 mmHg (03/27 0442) SpO2:  [96 %-98 %] 98 % (03/27 0442) Last BM Date: 01/07/14  Intake/Output from previous day: 03/26 0701 - 03/27 0700 In: 1592.5 [I.V.:1592.5] Out: 3305 [Urine:2800; Emesis/NG output:505] Intake/Output this shift: Total I/O In: -  Out: 700 [Urine:700]  General appearance: alert and cooperative Back: no tenderness to percussion or palpation Cardio: regular rate and rhythm GI: soft, distended, tender R mid without guarding or peritonitis, few BS Lungs: CTA Lab Results:   Recent Labs  01/06/14 2345 01/09/14 0532  WBC 10.7* 12.4*  HGB 11.6* 8.9*  HCT 31.4* 25.0*  PLT 288 368   BMET  Recent Labs  01/08/14 1033 01/09/14 0532  NA 143 150*  K 3.9 4.2  CL 102 112  CO2 24 24  GLUCOSE 94 106*  BUN 77* 61*  CREATININE 3.42* 2.34*  CALCIUM 9.0 9.2   PT/INR  Recent Labs  01/09/14 0532  LABPROT 18.0*  INR 1.53*   ABG  Recent Labs  01/07/14 0526  HCO3 25.6*    Studies/Results: Kore[place] Abdomen Limited Ruq  01/08/2014   CLINICAL DATA:  Abdominal pain, suspect acute cholecystitis  EXAM: US ABDOMEN LIMITED - RIGHT UPPER QUADRANT  COMPARISON:  CT ABD/PELV WO CM dated 01/07/2014  FINDINGS: Gallbladder:  The gallbladder is adequately distended and contains echogenic bile or sludge. No stones are evident. There is no gallbladder wall thickening or pericholecystic fluid or positive sonographic Murphy's sign.  Common bile duct:  Diameter: 4.8 mm  Liver:  The echotexture of the liver is somewhat heterogeneous. There is no focal mass nor ductal dilation.  IMPRESSION: The gallbladder contains sludge versus radiodense bile and no evidence of stones. There is no sonographic evidence of acute cholecystitis. Further evaluation  with a nuclear medicine hepatobiliary scan and gallbladder ejection fraction may be useful. The common bile duct and liver exhibit no acute abnormalities.   Electronically Signed   By: David  Swazil[place]   On: 01/08/2014 15:05    Anti-infectives: Anti-infectives   Start     Dose/Rate Route Frequency Ordered Stop   01/08/14 2300  piperacillin-tazobactam (ZOSYN) IVPB 3.375 g     3.375 g 12.5 mL/hr over 240 Minutes Intravenous Every 8 hours 01/08/14 1447     01/08/14 1500  piperacillin-tazobactam (ZOSYN) IVPB 3.375 g     3.375 g 100 mL/hr over 30 Minutes Intravenous  Once 01/08/14 1447 01/08/14 1540      Assessment/Plan: SBO - not improving. Will take to OR for ex lap and possible bowel resection. Porcedure, risks, benefits D/W him again today. He agrees AKI - improving with hydration  Hyperbilirubinemia - likely acute liver injury due to hypovolemia as INR up slightly as well. U/S with GB sludge, no cholecystitis, no CBD dilatation I spoke with the primary team  LOS: 2 days    Lonnie Henry E 01/09/2014

## 2014-01-09 NOTE — Progress Notes (Signed)
  Date: 01/09/2014  Patient name: Lonnie Henry  Medical record number: [id number]  Date of birth: 1951/06/13   This patient has been seen and the plan of care was discussed with the house staff. Please see their note for complete details. I concur with their findings with the following additions/corrections: Plans for ex-lap today due to little improvement in SBO. He has some concerning laboratory findings. I agree he has evidence of pre-renal azotemia since his Cr has improved dramatically with IVF. He is actually exhibiting hypernatremia and borderline hyperchloremia from aggressive NS volume resuscitation. But, he undeniably has a low albumin (2.4), elevated INR (1.53), and a normocytic anemia. In addition, he has an elevated direct bilirubinemia. U/S with heterogenous liver. I suspect some underlying primary liver disease (cirrhosis?). With shock liver or hypotension mediated liver dysfunction,  I would not expect to see AST/ALT in double digit territory and I would not expect to see an albumin of 2.4. Would send Hep C Ab and if positive a viral load.  Jonah BlueAlejandro Giovanie Lefebre, DO, FACP [facility] 01/09/2014, 2:40 PM

## 2014-01-09 NOTE — OR Nursing (Signed)
Late entry at 1448 to document placement of drain.

## 2014-01-09 NOTE — Anesthesia Postprocedure Evaluation (Signed)
  Anesthesia Post-op Note  Patient: Lonnie Henry  Procedure(s) Performed: Procedure(s): EXPLORATORY LAPAROTOMY (N/A) LYSIS OF ADHESION (N/A) APPENDECTOMY (N/A) DRAINAGE OF INTRAABDOMINAL ABSCESS (N/A)  Patient Location: PACU  Anesthesia Type:General  Level of Consciousness: awake, alert  and oriented  Airway and Oxygen Therapy: Patient Spontanous Breathing and Patient connected to nasal cannula oxygen  Post-op Pain: moderate  Post-op Assessment: Post-op Vital signs reviewed  Post-op Vital Signs: Reviewed  Complications: No apparent anesthesia complications

## 2014-01-09 NOTE — Progress Notes (Signed)
Subjective: Patient seen and examined at the bedside this morning. Still no flatus or BM. Spoke with surgery who will take him to the OR for exploratory laparotomy and possible bowel resection. He is amenable.  Objective: Vital signs in last 24 hours: Filed Vitals:   01/08/14 1316 01/08/14 2113 01/09/14 0437 01/09/14 0442  BP: 108/65 128/79 151/80 117/76  Pulse: 80 85 92 79  Temp: 99.3 F (37.4 C) 99.8 F (37.7 C) 98.6 F (37 C) 98.8 F (37.1 C)  TempSrc: Oral Oral Oral Oral  Resp: _0 Height:      Weight:      SpO2: 97% 96% 98% 98%   Weight change:   Intake/Output Summary (Last 24 hours) at 01/09/14 0721 Last data filed at 01/09/14 0343  Gross per 24 hour  Intake 1592.5 ml  Output   3305 ml  Net -1712.5 ml   Physical Exam:  General: alert, cooperative, appears uncomfortable HEENT: PERRL, EOMI, oropharynx clear and non-erythematous, NG tube in place Neck: supple Lungs: clear to ascultation bilaterally, normal work of respiration, no wheezes, rales, ronchi Heart: regular rate and rhythm, no murmurs, gallops, or rubs Abdomen: Hypoactive-to-mute bowel sounds, still tender in the RLQ, no rebound tenderness Extremities: no cyanosis, clubbing, or edema Neurologic: alert & oriented X3, cranial nerves II-XII intact, strength grossly intact, sensation intact to light touch  Lab Results: Basic Metabolic Panel:  Recent Labs Lab 01/08/14 1033 01/09/14 0532  NA 143 150*  K 3.9 4.2  CL 102 112  CO2 24 24  GLUCOSE 94 106*  BUN 77* 61*  CREATININE 3.42* 2.34*  CALCIUM 9.0 9.2   Liver Function Tests:  Recent Labs Lab 01/08/14 1033 01/09/14 0532  AST 49* 42*  ALT 61* 59*  ALKPHOS 75 83  BILITOT 7.3* 6.3*  PROT 7.3 7.2  ALBUMIN 2.5* 2.4*    Recent Labs Lab 01/06/14 2345  LIPASE 24   No results found for this basename: AMMONIA,  in the last 168 hours CBC:  Recent Labs Lab 01/06/14 2345 01/09/14 0532  WBC 10.7* 12.4*  NEUTROABS 8.4*  --   HGB  11.6* 8.9*  HCT 31.4* 25.0*  MCV 91.0 92.9  PLT 288 368   Cardiac Enzymes:  Recent Labs Lab 01/08/14 1208  CKTOTAL 265*   BNP: No results found for this basename: PROBNP,  in the last 168 hours D-Dimer: No results found for this basename: DDIMER,  in the last 168 hours CBG:  Recent Labs Lab 01/08/14 1741 01/08/14 2355 01/09/14 0632  GLUCAP 93 98 92   Hemoglobin A1C: No results found for this basename: HGBA1C,  in the last 168 hours Fasting Lipid Panel: No results found for this basename: CHOL, HDL, LDLCALC, TRIG, CHOLHDL, LDLDIRECT,  in the last 168 hours Thyroid Function Tests: No results found for this basename: TSH, T4TOTAL, FREET4, T3FREE, THYROIDAB,  in the last 168 hours Coagulation:  Recent Labs Lab 01/09/14 0532  LABPROT 18.0*  INR 1.53*   Anemia Panel: No results found for this basename: VITAMINB12, FOLATE, FERRITIN, TIBC, IRON, RETICCTPCT,  in the last 168 hours Urine Drug Screen: Drugs of Abuse  No results found for this basename: labopia,  cocainscrnur,  labbenz,  amphetmu,  thcu,  labbarb    Alcohol Level: No results found for this basename: ETH,  in the last 168 hours Urinalysis:  Recent Labs Lab 01/07/14 0734  COLORURINE AMBER*  LABSPEC 1.018  PHURINE 5.0  GLUCOSEU NEGATIVE  HGBUR MODERATE*  BILIRUBINUR SMALL*  KETONESUR NEGATIVE  PROTEINUR 30*  UROBILINOGEN 1.0  NITRITE NEGATIVE  LEUKOCYTESUR NEGATIVE    Micro Results: Recent Results (from the past 240 hour(s))  SURGICAL PCR SCREEN     Status: Abnormal   Collection Time    01/09/14  4:48 AM      Result Value Ref Range Status   MRSA, PCR NEGATIVE  NEGATIVE Final   Staphylococcus aureus POSITIVE (*) NEGATIVE Final   Comment:            The Xpert SA Assay (FDA     approved for NASAL specimens     in patients over 73 years of age),     is one component of     a comprehensive surveillance     program.  Test performance has     been validated by [employer] for patients  greater     than or equal to 31 year old.     It is not intended     to diagnose infection nor to     guide or monitor treatment.   Studies/Results: US Abdomen Limited Ruq  01/08/2014   CLINICAL DATA:  Abdominal pain, suspect acute cholecystitis  EXAM: US ABDOMEN LIMITED - RIGHT UPPER QUADRANT  COMPARISON:  CT ABD/PELV WO CM dated 01/07/2014  FINDINGS: Gallbladder:  The gallbladder is adequately distended and contains echogenic bile or sludge. No stones are evident. There is no gallbladder wall thickening or pericholecystic fluid or positive sonographic Murphy's sign.  Common bile duct:  Diameter: 4.8 mm  Liver:  The echotexture of the liver is somewhat heterogeneous. There is no focal mass nor ductal dilation.  IMPRESSION: The gallbladder contains sludge versus radiodense bile and no evidence of stones. There is no sonographic evidence of acute cholecystitis. Further evaluation with a nuclear medicine hepatobiliary scan and gallbladder ejection fraction may be useful. The common bile duct and liver exhibit no acute abnormalities.   Electronically Signed   By: David  Martinique   On: 01/08/2014 15:05   Medications: I have reviewed the patient's current medications. Scheduled Meds: . antiseptic oral rinse  15 mL Mouth Rinse q12n4p  . chlorhexidine  15 mL Mouth Rinse BID  . Chlorhexidine Gluconate Cloth  6 each Topical Daily  . heparin  5,000 Units Subcutaneous 3 times per day  . mupirocin ointment  1 application Nasal BID  . piperacillin-tazobactam (ZOSYN)  IV  3.375 g Intravenous Q8H   Continuous Infusions: . sodium chloride 150 mL/hr at 01/09/14 0051   PRN Meds:.morphine injection, ondansetron  Assessment/Plan: The patient is a 63 yo man, presenting with abdominal pain, nausea, and vomiting, found to have a small bowel obstruction.  #Acute small bowel obstruction - SBO seen on CT abdomen. No history of prior abdominal surgeries. Possible preceding viral gastroenteritis. He has a leukocytosis  today to 12.2. Not improving. Surgery to perform exploratory laparotomy today and possible bowel resection. - Appreciate surgery recs - Continue NPO, NG tube - Zosyn IV to cover intra-abdominal infection - Morphine, zofran for symptomatic relief  - Continue IVF at 100 cc/hr  - OR today (scheduled for 1315)  #Acute kidney injury - The patient presented with a Cr of 8.62, from his last known baseline of 1.2. This likely represented a combination of prerenal azotemia and lisinopril-induced ATN vs. NSAID usage. It has improved dramatically and he is making good urine (UOP 2800cc yesterday). CK checked and 265, not rhabdomyolysis level. - IVF per above  - Holding nephrotoxic meds -  Trend BMETs   Creatinine, Ser  Date Value Ref Range Status  01/09/2014 2.34* 0.50 - 1.35 mg/dL Final  01/08/2014 3.42* 0.50 - 1.35 mg/dL Final     DELTA CHECK NOTED     RESULTS VERIFIED VIA RECOLLECT  01/07/2014 7.05* 0.50 - 1.35 mg/dL Final  01/06/2014 8.62* 0.50 - 1.35 mg/dL Final    #Hyperbilirubinemia - Patient has a primarily direct hyperbilirubinemia, concerning for biliary obstruction. RUQ ultrasound showed sludge versus radiodense bile and no evidence of stones. There was no sonographic evidence of acute cholecystitis. The common bile duct and liver exhibited no acute abnormalities. It is stabilizing. Likely resolving shock liver. - Continue to monitor  Bilirubin     Component Value Date/Time   BILITOT 6.3* 01/09/2014 0532   BILIDIR 4.3* 01/09/2014 0532   IBILI 1.6* 01/07/2014 0912    #Acute on chronic anemia - Patient has a history of macrocytic anemia of unknown origin that was worked up extensively at [facility] (records are in [place]). Nutritional markers were normal and he even had a bone marrow biopsy that was unremarkable. Hemoglobin has down trended since admission. No gross bleeding. NGT output is bilious. This is likely dilutional s/p IVF resucitation. Also considered hemolytic anemia, but  with this you typically see an indirect hyperbilirubinemia. He will go to the OR today. - Add-on smear review - Monitor for bleeding - Daily CBC  Hemoglobin  Date Value Ref Range Status  01/09/2014 8.9* 13.0 - 17.0 g/dL Final  01/06/2014 11.6* 13.0 - 17.0 g/dL Final  11/29/2009 12.6* 13.0 - 17.0 g/dL Final  11/29/2009 12.6* 13.0 - 17.0 g/dL Final  11/29/2009 9.3* 13.0 - 17.0 g/dL Final    #Elevated anion gap, resolved - The patient presented with an AG = 22, likely secondary to uremia. Lactate wnl. It is now 49. - IVF per above  - Trend BMET   #Hypernatremia - Patient was hyponatremic on admission, in the setting of poor PO intake, vomiting. His Na is up today s/p IVF resuscitation with NS. - IVF as above, we have decreased the rate, will likely need to change to D5-1/2NS after he is surgically stabilized  Sodium  Date Value Ref Range Status  01/09/2014 150* 137 - 147 mEq/L Final  01/08/2014 143  137 - 147 mEq/L Final     DELTA CHECK NOTED     RESULTS VERIFIED VIA RECOLLECT  01/07/2014 132* 137 - 147 mEq/L Final  01/06/2014 129* 137 - 147 mEq/L Final  11/29/2009 140  135 - 145 mEq/L Final    #Alcohol use - The patient drinks 2-3 beers/day. His last drink was approximately 1 week ago. Withdrawal unlikely.  #Prophy - Heparin subq, SCDs   Dispo: Disposition is deferred at this time, awaiting improvement of current medical problems.  Anticipated discharge in approximately 1-3 day(s).   The patient does have a current PCP (Dionne Volanda Napoleon, MD) and does not need an [facility] hospital follow-up appointment after discharge.  The patient does not have transportation limitations that hinder transportation to clinic appointments.  .Services Needed at time of discharge: Y = Yes, Blank = No PT:   OT:   RN:   Equipment:   Other:     LOS: 2 days   Lesly Dukes, MD 01/09/2014, 7:21 AM  Lesly Dukes, MD  [email or web address] Pager # [phone] Office # [phone]

## 2014-01-09 NOTE — Preoperative (Signed)
Beta Blockers   Reason not to administer Beta Blockers:Not Applicable 

## 2014-01-10 DIAGNOSIS — E870 Hyperosmolality and hypernatremia: Secondary | ICD-10-CM

## 2014-01-10 LAB — CBC
HCT: 28.4 % — ABNORMAL LOW (ref 39.0–52.0)
Hemoglobin: 10 g/dL — ABNORMAL LOW (ref 13.0–17.0)
MCH: 34 pg (ref 26.0–34.0)
MCHC: 35.2 g/dL (ref 30.0–36.0)
MCV: 96.6 fL (ref 78.0–100.0)
Platelets: 431 10*3/uL — ABNORMAL HIGH (ref 150–400)
RBC: 2.94 MIL/uL — ABNORMAL LOW (ref 4.22–5.81)
RDW: 13.5 % (ref 11.5–15.5)
WBC: 13.5 10*3/uL — ABNORMAL HIGH (ref 4.0–10.5)

## 2014-01-10 LAB — BASIC METABOLIC PANEL
BUN: 49 mg/dL — ABNORMAL HIGH (ref 6–23)
BUN: 47 mg/dL — ABNORMAL HIGH (ref 6–23)
CALCIUM: 9.2 mg/dL (ref 8.4–10.5)
CHLORIDE: 120 meq/L — AB (ref 96–112)
CO2: 27 meq/L (ref 19–32)
CO2: 26 meq/L (ref 19–32)
Calcium: 9.1 mg/dL (ref 8.4–10.5)
Chloride: 121 mEq/L — ABNORMAL HIGH (ref 96–112)
Creatinine, Ser: 2.63 mg/dL — ABNORMAL HIGH (ref 0.50–1.35)
Creatinine, Ser: 2.76 mg/dL — ABNORMAL HIGH (ref 0.50–1.35)
GFR calc Af Amer: 27 mL/min — ABNORMAL LOW (ref >90)
GFR calc non Af Amer: 25 mL/min — ABNORMAL LOW (ref >90)
GFR calc non Af Amer: 23 mL/min — ABNORMAL LOW (ref >90)
GFR, EST AFRICAN AMERICAN: 29 mL/min — AB (ref >90)
Glucose, Bld: 144 mg/dL — ABNORMAL HIGH (ref 70–99)
Glucose, Bld: 151 mg/dL — ABNORMAL HIGH (ref 70–99)
POTASSIUM: 4.8 meq/L (ref 3.7–5.3)
POTASSIUM: 4.6 meq/L (ref 3.7–5.3)
SODIUM: 161 meq/L — AB (ref 137–147)
SODIUM: 157 meq/L — AB (ref 137–147)

## 2014-01-10 LAB — COMPREHENSIVE METABOLIC PANEL
ALT: 49 U/L (ref 0–53)
AST: 33 U/L (ref 0–37)
Albumin: 2.1 g/dL — ABNORMAL LOW (ref 3.5–5.2)
Alkaline Phosphatase: 83 U/L (ref 39–117)
BUN: 44 mg/dL — ABNORMAL HIGH (ref 6–23)
CO2: 26 mEq/L (ref 19–32)
Calcium: 9.1 mg/dL (ref 8.4–10.5)
Chloride: 119 mEq/L — ABNORMAL HIGH (ref 96–112)
Creatinine, Ser: 2.23 mg/dL — ABNORMAL HIGH (ref 0.50–1.35)
GFR calc Af Amer: 35 mL/min — ABNORMAL LOW (ref >90)
GFR calc non Af Amer: 30 mL/min — ABNORMAL LOW (ref >90)
Glucose, Bld: 132 mg/dL — ABNORMAL HIGH (ref 70–99)
Potassium: 5.2 mEq/L (ref 3.7–5.3)
Sodium: 159 mEq/L — ABNORMAL HIGH (ref 137–147)
Total Bilirubin: 6.4 mg/dL — ABNORMAL HIGH (ref 0.3–1.2)
Total Protein: 7 g/dL (ref 6.0–8.3)

## 2014-01-10 LAB — GLUCOSE, CAPILLARY
GLUCOSE-CAPILLARY: 114 mg/dL — AB (ref 70–99)
Glucose-Capillary: 123 mg/dL — ABNORMAL HIGH (ref 70–99)
Glucose-Capillary: 125 mg/dL — ABNORMAL HIGH (ref 70–99)

## 2014-01-10 LAB — BILIRUBIN, DIRECT: Bilirubin, Direct: 4.8 mg/dL — ABNORMAL HIGH (ref 0.0–0.3)

## 2014-01-10 LAB — HEPATITIS PANEL, ACUTE
HCV Ab: NEGATIVE
Hep A IgM: NONREACTIVE
Hep B C IgM: NONREACTIVE
Hepatitis B Surface Ag: NEGATIVE

## 2014-01-10 LAB — MAGNESIUM: Magnesium: 2.3 mg/dL (ref 1.5–2.5)

## 2014-01-10 MED ORDER — THIAMINE HCL 100 MG/ML IJ SOLN
100.0000 mg | Freq: Every day | INTRAMUSCULAR | Status: DC
  Administered 2014-01-10 – 2014-01-12 (×3): 100 mg via INTRAVENOUS
  Filled 2014-01-10 (×4): qty 1

## 2014-01-10 MED ORDER — DEXTROSE 5 % IV SOLN
INTRAVENOUS | Status: AC
  Administered 2014-01-10 – 2014-01-12 (×5): via INTRAVENOUS

## 2014-01-10 MED ORDER — DEXTROSE-NACL 5-0.45 % IV SOLN: INTRAVENOUS | Status: DC

## 2014-01-10 MED ORDER — ADULT MULTIVITAMIN W/MINERALS CH
1.0000 | ORAL_TABLET | Freq: Every day | ORAL | Status: DC
  Filled 2014-01-10 (×3): qty 1

## 2014-01-10 MED ORDER — FOLIC ACID 1 MG PO TABS
1.0000 mg | ORAL_TABLET | Freq: Every day | ORAL | Status: DC
Start: 2014-01-10 — End: 2014-01-13
  Filled 2014-01-10 (×4): qty 1

## 2014-01-10 MED ORDER — VITAMIN B-1 100 MG PO TABS
100.0000 mg | ORAL_TABLET | Freq: Every day | ORAL | Status: DC
  Filled 2014-01-10 (×4): qty 1

## 2014-01-10 MED ORDER — LORAZEPAM 2 MG/ML IJ SOLN
1.0000 mg | Freq: Four times a day (QID) | INTRAMUSCULAR | Status: AC | PRN
  Administered 2014-01-10 (×2): 1 mg via INTRAVENOUS
  Filled 2014-01-10 (×2): qty 1

## 2014-01-10 MED ORDER — DEXTROSE 5 % IV SOLN
INTRAVENOUS | Status: DC
  Administered 2014-01-10: 13:00:00 via INTRAVENOUS

## 2014-01-10 MED ORDER — LORAZEPAM 1 MG PO TABS: 1.0000 mg | ORAL_TABLET | Freq: Four times a day (QID) | ORAL | Status: AC | PRN

## 2014-01-10 MED ORDER — DEXTROSE-NACL 5-0.45 % IV SOLN
INTRAVENOUS | Status: DC
  Administered 2014-01-10: 14:00:00 via INTRAVENOUS

## 2014-01-10 NOTE — Progress Notes (Signed)
Report was given to 3S and family  Was made aware of room 3S10

## 2014-01-10 NOTE — Progress Notes (Addendum)
CRITICAL VALUE ALERT  Critical value received:  Gram positive cocci pairs  Date of notification:  3/27  Time of notification:  2320  Critical value read back:yes  Nurse who received alert:  Marlon Pelora gardiner   MD notified (1st page):  Paya (on call MD)  Time of first page:  0000  MD notified (2nd page):  Time of second page:  Responding MD:    Time MD responded:  0005

## 2014-01-10 NOTE — Progress Notes (Signed)
Very sensitive to palpation, - lots of pain, even if lift gown or doing IS.  Generally drinks craft beer daily, lot drink was on [facility] day per pt.  Conversive, ox3 ttp throughout  Hypernatremia per primary team Doubt etoh withdrawal - but ativan will help abd wall cramps ARF - per primary team, may need abx renal dosed Cont PCA  Mary SellaEric M. Andrey CampanileWilson, MD, FACS General, Bariatric, & Minimally Invasive Surgery [facility] [place]

## 2014-01-10 NOTE — Progress Notes (Signed)
Subjective: Patient seen and examined this morning.  His abdominal pain has not improved and he has signs of peritonitis. Denies confusion, chest pain, cough.     Objective: Vital signs in last 24 hours: Filed Vitals:   01/10/14 1157 01/10/14 1159 01/10/14 1200 01/10/14 1203  BP:      Pulse:      Temp:      TempSrc:      Resp: 16 16 16 16   Height:      Weight:      SpO2: 98% 98% 96% 96%   Weight change:   Intake/Output Summary (Last 24 hours) at 01/10/14 1237 Last data filed at 01/10/14 1200  Gross per 24 hour  Intake   1600 ml  Output   3135 ml  Net  -1535 ml   General: alert, cooperative, appears uncomfortable HEENT: PERRL, EOMI, oropharynx clear and non-erythematous, NG tube in place, scleral jaundice Neck: supple Lungs: clear to ascultation bilaterally, normal work of respiration, no wheezes, rales, ronchi Heart: regular rate and rhythm, no murmurs, gallops, or rubs Abdomen: severe tenderness with slight touch with guarding, no BS, surgical incision covered with dressing that is c/d/i Extremities: no cyanosis, clubbing, or edema Neurologic: alert & oriented X3, no asterixis. Moves all extremities voluntairly  Lab Results: Basic Metabolic Panel:  Recent Labs Lab 01/09/14 0532 01/10/14 0605  NA 150* 159*  K 4.2 5.2  CL 112 119*  CO2 24 26  GLUCOSE 106* 132*  BUN 61* 44*  CREATININE 2.34* 2.23*  CALCIUM 9.2 9.1  MG  --  2.3   Liver Function Tests:  Recent Labs Lab 01/09/14 0532 01/10/14 0605  AST 42* 33  ALT 59* 49  ALKPHOS 83 83  BILITOT 6.3* 6.4*  PROT 7.2 7.0  ALBUMIN 2.4* 2.1*    Recent Labs Lab 01/06/14 2345  LIPASE 24   CBC:  Recent Labs Lab 01/06/14 2345 01/09/14 0532 01/10/14 0605  WBC 10.7* 12.4* 13.5*  NEUTROABS 8.4*  --   --   HGB 11.6* 8.9* 10.0*  HCT 31.4* 25.0* 28.4*  MCV 91.0 92.9 96.6  PLT 288 368 431*   Cardiac Enzymes:  Recent Labs Lab 01/08/14 1208  CKTOTAL 265*   CBG:  Recent Labs Lab  01/08/14 2355 01/09/14 0602 01/09/14 0632 01/09/14 2353 01/10/14 0616 01/10/14 1147  GLUCAP 98 89 92 107* 123* 114*   Coagulation:  Recent Labs Lab 01/09/14 0532  LABPROT 18.0*  INR 1.53*   Urinalysis:  Recent Labs Lab 01/07/14 0734  COLORURINE AMBER*  LABSPEC 1.018  PHURINE 5.0  GLUCOSEU NEGATIVE  HGBUR MODERATE*  BILIRUBINUR SMALL*  KETONESUR NEGATIVE  PROTEINUR 30*  UROBILINOGEN 1.0  NITRITE NEGATIVE  LEUKOCYTESUR NEGATIVE    Micro Results: Recent Results (from the past 240 hour(s))  SURGICAL PCR SCREEN     Status: Abnormal   Collection Time    01/09/14  4:48 AM      Result Value Ref Range Status   MRSA, PCR NEGATIVE  NEGATIVE Final   Staphylococcus aureus POSITIVE (*) NEGATIVE Final   Comment:            The Xpert SA Assay (FDA     approved for NASAL specimens     in patients over 58 years of age),     is one component of     a comprehensive surveillance     program.  Test performance has     been validated by [employer] for patients  greater     than or equal to 1 year old.     It is not intended     to diagnose infection nor to     guide or monitor treatment.  ANAEROBIC CULTURE     Status: None   Collection Time    2014-01-24  1:46 PM      Result Value Ref Range Status   Specimen Description PERITONEAL FLUID   Final   Special Requests FLUID ON SWAB PT ON ZOSYN   Final   Gram Stain     Final   Value: RARE WBC PRESENT, PREDOMINANTLY MONONUCLEAR     NO SQUAMOUS EPITHELIAL CELLS SEEN     RARE GRAM POSITIVE COCCI     IN PAIRS     Performed at [facility]   Culture     Final   Value: NO ANAEROBES ISOLATED; CULTURE IN PROGRESS FOR 5 DAYS     Note: Gram Stain Report Called to,Read Back By and Verified With: DORA GARDNER ON 01-24-2014 AT 11:27P BY WILEJ     Performed at [facility]   Report Status PENDING   Incomplete  BODY FLUID CULTURE     Status: None   Collection Time    2014/01/24  1:46 PM      Result Value Ref Range  Status   Specimen Description PERITONEAL FLUID   Final   Special Requests FLUID ON SWAB PT ON ZOSYN   Final   Gram Stain     Final   Value: WBC PRESENT, PREDOMINANTLY MONONUCLEAR     GRAM POSITIVE COCCI     IN PAIRS     Performed at [facility]   Culture PENDING   Incomplete   Report Status PENDING   Incomplete   Studies/Results: Dg Abd 2 Views  Jan 24, 2014   CLINICAL DATA:  Right mid to lateral abdominal pain and swelling  EXAM: ABDOMEN - 2 VIEW  COMPARISON:  CT ABD/PELV WO CM dated 01/07/2014; DG ABD ACUTE W/CHEST dated 01/07/2014  FINDINGS: There is marked gaseous distention of multiple rather featureless loops of small bowel with index loop of small bowel in the left upper abdominal quadrant measuring approximately 4.8 cm in diameter. Multiple air-fluid levels are noted on the upright radiograph. No pneumoperitoneum or pneumatosis. A minimal amount of enteric contrast is seen within the ascending colon.  Enteric tube tip and side port projects over the expected location of the gastric fundus.  Limited visualization the lower thorax demonstrates bibasilar opacities and possible trace bilateral effusions.  Degenerative change of the lumbar spine suspected though incompletely evaluated.  IMPRESSION: 1. Similar findings compatible with high-grade partial small bowel obstruction. 2. Enteric tube tip and side port projects over the gastric fundus.   Electronically Signed   By: Simonne Come M.D.   On: 01-24-14 09:14   US Abdomen Limited Ruq  01/08/2014   CLINICAL DATA:  Abdominal pain, suspect acute cholecystitis  EXAM: US ABDOMEN LIMITED - RIGHT UPPER QUADRANT  COMPARISON:  CT ABD/PELV WO CM dated 01/07/2014  FINDINGS: Gallbladder:  The gallbladder is adequately distended and contains echogenic bile or sludge. No stones are evident. There is no gallbladder wall thickening or pericholecystic fluid or positive sonographic Murphy's sign.  Common bile duct:  Diameter: 4.8 mm  Liver:  The echotexture  of the liver is somewhat heterogeneous. There is no focal mass nor ductal dilation.  IMPRESSION: The gallbladder contains sludge versus radiodense bile and no evidence of stones. There is  no sonographic evidence of acute cholecystitis. Further evaluation with a nuclear medicine hepatobiliary scan and gallbladder ejection fraction may be useful. The common bile duct and liver exhibit no acute abnormalities.   Electronically Signed   By: David  [place]azil[place]   On: 01/08/2014 15:05   Medications: I have reviewed the patient's current medications. Scheduled Meds: . antiseptic oral rinse  15 mL Mouth Rinse q12n4p  . chlorhexidine  15 mL Mouth Rinse BID  . Chlorhexidine Gluconate Cloth  6 each Topical Daily  . folic acid  1 mg Oral Daily  . heparin  5,000 Units Subcutaneous 3 times per day  . HYDROmorphone PCA 0.3 mg/mL   Intravenous 6 times per day  . multivitamin with minerals  1 tablet Oral Daily  . mupirocin ointment  1 application Nasal BID  . piperacillin-tazobactam (ZOSYN)  IV  3.375 g Intravenous Q8H  . thiamine  100 mg Oral Daily   Or  . thiamine  100 mg Intravenous Daily   Continuous Infusions: . dextrose 5 % and 0.45% NaCl     PRN Meds:.diphenhydrAMINE, diphenhydrAMINE, LORazepam, LORazepam, morphine injection, naloxone, ondansetron, sodium chloride Assessment/Plan: The patient is a 63 yo man, presenting with abdominal pain, nausea, and vomiting, found to have a small bowel obstruction.   #Acute small bowel obstruction/Peritonitis/Rupture Appendicitis- SBO seen on CT abdomen. No history of prior abdominal surgeries. Possible preceding viral gastroenteritis. Pain has localized to McBurney's point yesterday morning. Now POD#1 for ext lap with ruptured appendicitis, SBO, lysis of adhesions. Pt with signs of peritonitis this am. Vital signs stable. Per PCCM pt needs to be reevaluated by surgery before transfer to ICU v Stepdown.  - Appreciate surgery recs  - Continue NPO, NG tube  - PCA pump,  zofran for symptomatic relief  - Continue Zosyn  #Hypernatremia - Sodium has slowly trended up, to 159 this morning. Likely iatrogenic.  -D/c NS -Start D51/2NS at 14150ml/hr (should not correct more than12 mEq/L per 24hours to prevent central pontine myelinolysis) -BMET q 4hours  #Acute kidney injury - Creatinine improving. The patient presents with a Cr of 8.62, from his last known baseline of 1.2. This likely represents a combination of prerenal azotemia and lisinopril-induced ATN vs. NSAID usage.  - IVF per above  - Holding nephrotoxic meds  - Trend BMETs  Creatinine, Ser   Date  Value  Ref Range  Status   01/07/2014  7.05*  0.50 - 1.35 mg/dL  Final   8/11/91473/24/2015  8.298.62*  0.50 - 1.35 mg/dL  Final   5/62/13082/14/2011  1.2  0.4 - 1.5 mg/dL  Final   6/57/84692/14/2011  6.291.27  0.4 - 1.5 mg/dL  Final    #Elevated anion gap - The patient presents with an AG = 22, likely secondary to uremia. Lactate wnl.  - IVF per above  - Trend BMET   #Hyperbilirubinemia - Bili labs below, continues to trend up. This is likely secondary to hypoperfusion from hypovolemia. RUQ US with no obvious obstruction but not definite, may need HIDA scan.  -CMP in am  Bilirubin    Component  Value  Date/Time    BILITOT  4.1*  01/07/2014 0912    BILIDIR  2.5*  01/07/2014 0912    IBILI  1.6*  01/07/2014 0912    #Hyponatremia -Resolved. Now hypernatremic   #Alcohol use - The patient drinks 2-3 beers/day for at least [facility] His last drink was approximately 1 week ago. Withdrawal unlikely. -CIWA with thiamine and folate  #Prophy -  Heparin subq, SCDs  Dispo: Disposition is deferred at this time, awaiting improvement of current medical problems.  Anticipated discharge in approximately 2-3 day(s).   The patient does have a current PCP (Dionne Jake Samples, MD) and does not need an [facility] hospital follow-up appointment after discharge.  The patient does not have transportation limitations that hinder transportation to clinic  appointments.  .Services Needed at time of discharge: Y = Yes, Blank = No PT:   OT:   RN:   Equipment:   Other:     LOS: 3 days   Ky Barban, MD 01/10/2014, 12:37 PM

## 2014-01-10 NOTE — Progress Notes (Addendum)
  Date: 01/10/2014  Patient name: Lonnie Henry  Medical record number: [id number]  Date of birth: [birth year]   This patient has been seen and the plan of care was discussed with the house staff. Please see their note for complete details. I concur with their findings with the following additions/corrections: POD #1. Lonnie Henry had findings of perforated appendix on ex-lap in addition to adhesions.  States his pain is controlled as long as Lonnie Henry remains still.  This morning on exam, GEN: AAO to person, place, time. Mild distress due to pain. HEENT: NGT in place, dry mucosa. CV: S1S2, tachy, no m/r/g, reg rhythm. PULM: CTA bilat ABD/GI: Softly distended but Lonnie Henry is very tender to the slightly touch, hypoactive BS, +guarding. LE/UE: 2/4 pulses, no c/c/e.  -Exhibits peritoneal signs, but Lonnie Henry has recent findings of perforated appendix with drainage of intraabdominal abscess. WBC 13k this morning, fever to 100.6 F last night. Hemodynamics stable. Continue Zosyn as monotherapy right now, but if clinically worsens or if culture data indicate resistance would switch to alternative regimen such as ceftriaxone plus metronidazole. Move to step down unit for now. GS is following. -Regarding his LFTs, I suspect this pt has underlying chronic liver disease (cirrhosis?) and will need to watch for decompensation. I don't really suspect biliary obstruction, but if rise continues, will need imaging such as MRCP vs ERCP. -Lonnie Henry also has an EtOH history, watch for withdrawal, CIWA. -Lonnie Henry has hypernatremia/hyperchloremia, switch to D51/2 NS as maintenance fluids and follow BMP every 4-6 hrs. -AKI continue to improve, suspect a component of pre-renal and ATN progression.   Jonah BlueAlejandro Morine Kohlman, DO, FACP Faculty [facility] 01/10/2014, 1:27 PM   ADDENDUM: Na is still noted to continue to rise. Na 161 at 1850. Lonnie Henry is NPO with NGT to low-int suction. Agree with D5W at 125 cc/hr. Suspect result of  aggressive volume resuscitation. Monitor BMP Q4hrs and decrease rate to 85 cc/hr if too rapid decrease. Cr noted 2.2-->2.6 in 12 hrs, Monitor I/O's. Likely result of recent intraabdominal process, but in light of recent surgery, will need to carefully monitor UOP.  Jonah BlueAlejandro Cherry [place], DO, FACP Faculty [facility] 01/10/2014, 10:20 PM

## 2014-01-10 NOTE — Progress Notes (Signed)
Patient ID: Lonnie MaserCarl Henry, male   DOB: August 11, 1951, 63 y.o.   MRN: [id number]   LOS: 3 days   Subjective: Doing ok since surgery. Denies N/V/flatus. Pain controlled with PCA.   Objective: Vital signs in last 24 hours: Temp:  [98.1 F (36.7 C)-100.6 F (38.1 C)] 99.5 F (37.5 C) (03/28 0514) Pulse Rate:  [76-115] 105 (03/28 0514) Resp:  [10-33] 16 (03/28 1203) BP: (124-172)/(55-101) 124/75 mmHg (03/28 0514) SpO2:  [96 %-100 %] 96 % (03/28 1203) Last BM Date: 01/06/14   NGT: 26950ml/24h JP: 37405ml/24h   Laboratory  CBC  Recent Labs  01/09/14 0532 01/10/14 0605  WBC 12.4* 13.5*  HGB 8.9* 10.0*  HCT 25.0* 28.4*  PLT 368 431*   BMET  Recent Labs  01/09/14 0532 01/10/14 0605  NA 150* 159*  K 4.2 5.2  CL 112 119*  CO2 24 26  GLUCOSE 106* 132*  BUN 61* 44*  CREATININE 2.34* 2.23*  CALCIUM 9.2 9.1    Physical Exam General appearance: alert and no distress Resp: Tachypneic, SOB Cardio: Mild tachycardia GI: Exquisitely TTP, absent BS, did not remove dressing   Assessment/Plan: Perf appy s/p ex lap, appendectomy, LOA -- POD #1. Continue NGT, npo, PCA. ARF -- Mildly improved Hypernatremia -- D5w @300ml /h (~203ml/kg/h) x2h then recheck Multiple medical problems -- per IM   Freeman CaldronMichael J. Bee Marchiano, PA-C Pager: [phone] General Trauma PA Pager: [phone]  01/10/2014

## 2014-01-10 NOTE — Progress Notes (Signed)
Pt c/o painful abrasion like area on the back of left upper arm. Foam dressing applied.

## 2014-01-11 ENCOUNTER — Inpatient Hospital Stay (HOSPITAL_COMMUNITY): Payer: BC Managed Care – PPO

## 2014-01-11 LAB — BASIC METABOLIC PANEL
BUN: 49 mg/dL — ABNORMAL HIGH (ref 6–23)
BUN: 53 mg/dL — AB (ref 6–23)
BUN: 55 mg/dL — ABNORMAL HIGH (ref 6–23)
BUN: 56 mg/dL — ABNORMAL HIGH (ref 6–23)
BUN: 56 mg/dL — ABNORMAL HIGH (ref 6–23)
BUN: 55 mg/dL — AB (ref 6–23)
BUN: 53 mg/dL — ABNORMAL HIGH (ref 6–23)
CALCIUM: 9.1 mg/dL (ref 8.4–10.5)
CALCIUM: 8.9 mg/dL (ref 8.4–10.5)
CHLORIDE: 120 meq/L — AB (ref 96–112)
CHLORIDE: 120 meq/L — AB (ref 96–112)
CHLORIDE: 114 meq/L — AB (ref 96–112)
CO2: 26 mEq/L (ref 19–32)
CO2: 27 mEq/L (ref 19–32)
CO2: 28 mEq/L (ref 19–32)
CO2: 27 mEq/L (ref 19–32)
CO2: 25 mEq/L (ref 19–32)
CO2: 27 mEq/L (ref 19–32)
CO2: 27 mEq/L (ref 19–32)
CREATININE: 3.59 mg/dL — AB (ref 0.50–1.35)
CREATININE: 3.67 mg/dL — AB (ref 0.50–1.35)
Calcium: 8.8 mg/dL (ref 8.4–10.5)
Calcium: 9 mg/dL (ref 8.4–10.5)
Calcium: 9 mg/dL (ref 8.4–10.5)
Calcium: 8.9 mg/dL (ref 8.4–10.5)
Calcium: 8.7 mg/dL (ref 8.4–10.5)
Chloride: 119 mEq/L — ABNORMAL HIGH (ref 96–112)
Chloride: 118 mEq/L — ABNORMAL HIGH (ref 96–112)
Chloride: 118 mEq/L — ABNORMAL HIGH (ref 96–112)
Chloride: 116 mEq/L — ABNORMAL HIGH (ref 96–112)
Creatinine, Ser: 3.05 mg/dL — ABNORMAL HIGH (ref 0.50–1.35)
Creatinine, Ser: 3.23 mg/dL — ABNORMAL HIGH (ref 0.50–1.35)
Creatinine, Ser: 3.38 mg/dL — ABNORMAL HIGH (ref 0.50–1.35)
Creatinine, Ser: 3.59 mg/dL — ABNORMAL HIGH (ref 0.50–1.35)
Creatinine, Ser: 3.57 mg/dL — ABNORMAL HIGH (ref 0.50–1.35)
GFR calc Af Amer: 24 mL/min — ABNORMAL LOW (ref >90)
GFR calc Af Amer: 22 mL/min — ABNORMAL LOW (ref >90)
GFR calc Af Amer: 21 mL/min — ABNORMAL LOW (ref >90)
GFR calc Af Amer: 20 mL/min — ABNORMAL LOW (ref >90)
GFR calc non Af Amer: 19 mL/min — ABNORMAL LOW (ref >90)
GFR, EST AFRICAN AMERICAN: 20 mL/min — AB (ref >90)
GFR, EST AFRICAN AMERICAN: 20 mL/min — AB (ref >90)
GFR, EST AFRICAN AMERICAN: 19 mL/min — AB (ref >90)
GFR, EST NON AFRICAN AMERICAN: 21 mL/min — AB (ref >90)
GFR, EST NON AFRICAN AMERICAN: 18 mL/min — AB (ref >90)
GFR, EST NON AFRICAN AMERICAN: 17 mL/min — AB (ref >90)
GFR, EST NON AFRICAN AMERICAN: 17 mL/min — AB (ref >90)
GFR, EST NON AFRICAN AMERICAN: 17 mL/min — AB (ref >90)
GFR, EST NON AFRICAN AMERICAN: 16 mL/min — AB (ref >90)
GLUCOSE: 130 mg/dL — AB (ref 70–99)
GLUCOSE: 146 mg/dL — AB (ref 70–99)
Glucose, Bld: 144 mg/dL — ABNORMAL HIGH (ref 70–99)
Glucose, Bld: 112 mg/dL — ABNORMAL HIGH (ref 70–99)
Glucose, Bld: 122 mg/dL — ABNORMAL HIGH (ref 70–99)
Glucose, Bld: 149 mg/dL — ABNORMAL HIGH (ref 70–99)
Glucose, Bld: 146 mg/dL — ABNORMAL HIGH (ref 70–99)
POTASSIUM: 4.9 meq/L (ref 3.7–5.3)
POTASSIUM: 4.7 meq/L (ref 3.7–5.3)
POTASSIUM: 4.7 meq/L (ref 3.7–5.3)
POTASSIUM: 4.6 meq/L (ref 3.7–5.3)
Potassium: 4.9 mEq/L (ref 3.7–5.3)
Potassium: 4.9 mEq/L (ref 3.7–5.3)
Potassium: 4.5 mEq/L (ref 3.7–5.3)
SODIUM: 159 meq/L — AB (ref 137–147)
SODIUM: 158 meq/L — AB (ref 137–147)
SODIUM: 156 meq/L — AB (ref 137–147)
SODIUM: 157 meq/L — AB (ref 137–147)
Sodium: 158 mEq/L — ABNORMAL HIGH (ref 137–147)
Sodium: 154 mEq/L — ABNORMAL HIGH (ref 137–147)
Sodium: 154 mEq/L — ABNORMAL HIGH (ref 137–147)

## 2014-01-11 LAB — HEPATIC FUNCTION PANEL
ALK PHOS: 61 U/L (ref 39–117)
ALT: 54 U/L — AB (ref 0–53)
AST: 65 U/L — ABNORMAL HIGH (ref 0–37)
Albumin: 2 g/dL — ABNORMAL LOW (ref 3.5–5.2)
BILIRUBIN INDIRECT: 1.3 mg/dL — AB (ref 0.3–0.9)
BILIRUBIN TOTAL: 5.2 mg/dL — AB (ref 0.3–1.2)
Bilirubin, Direct: 3.9 mg/dL — ABNORMAL HIGH (ref 0.0–0.3)
TOTAL PROTEIN: 6.9 g/dL (ref 6.0–8.3)

## 2014-01-11 LAB — GLUCOSE, CAPILLARY
GLUCOSE-CAPILLARY: 129 mg/dL — AB (ref 70–99)
Glucose-Capillary: 111 mg/dL — ABNORMAL HIGH (ref 70–99)
Glucose-Capillary: 133 mg/dL — ABNORMAL HIGH (ref 70–99)
Glucose-Capillary: 135 mg/dL — ABNORMAL HIGH (ref 70–99)

## 2014-01-11 LAB — CBC
HEMATOCRIT: 24.3 % — AB (ref 39.0–52.0)
HEMOGLOBIN: 8.4 g/dL — AB (ref 13.0–17.0)
MCH: 33.7 pg (ref 26.0–34.0)
MCHC: 34.6 g/dL (ref 30.0–36.0)
MCV: 97.6 fL (ref 78.0–100.0)
Platelets: 330 10*3/uL (ref 150–400)
RBC: 2.49 MIL/uL — AB (ref 4.22–5.81)
RDW: 13.9 % (ref 11.5–15.5)
WBC: 11 10*3/uL — ABNORMAL HIGH (ref 4.0–10.5)

## 2014-01-11 LAB — LACTIC ACID, PLASMA: Lactic Acid, Venous: 1.3 mmol/L (ref 0.5–2.2)

## 2014-01-11 NOTE — Progress Notes (Signed)
  Date: 01/11/2014  Patient name: Lonnie MaserCarl Sultan  Medical record number: [id number]  Date of birth: 1951-06-01   This patient has been seen and the plan of care was discussed with the house staff. Please see their note for complete details. I concur with their findings with the following additions/corrections: Still complains of abdominal pain, but controlled on IV pain medication. He states that it comes on in waves, but it is tolerable. He is otherwise oriented to person, place, and time.  On exam, Filed Vitals:   01/11/14 0800  BP: 102/59  Pulse:   Temp: 98.4 F (36.9 C)  Resp:   HR 89 RR 15 O2 sat = 99% on 1 L/min [place].  Net I/O since admission = -7.3 L UOP past 24 hours ~ 1600 mL   ABD/GI: Tender to even the slightest palpation over abdomen. +guarding. Abdominal dressing is clean. JP drain with serosanghinous drainage. I hear no bowel sounds, but exam limited by abdominal pain. He has been hypernatremic since 01/09/2014 (0532) @ 150 (pre-operatively).  IVF have been changed from NS, to D5 1/2 NS, now to D5W. Na peaked at 161 at 1850 on 01/10/2014.   Weight = 213 lb on admission (97.5 kg)  Free Water Deficit ~ 5.2 L  In light of his acute hypernatremia and current value of 158 at 9:50 am with FWD of 5.2 L, let's proceed with the following: 1) D5W IV at 290 mL/hr with a BMP every 1 hour until Na is 145 then decrease to ~90 mL/hr. Unfortunately, we have not made much headway at current rate.  2) AKI: Suspect significant volume depletion. Monitor UOP. If UOP decreased to oliguria, will need to obtain renal U/S to rule out any ureter related issues.  Jonah BlueAlejandro Alexzander Dolinger, DO, FACP Faculty [facility] 01/11/2014, 11:53 AM

## 2014-01-11 NOTE — Progress Notes (Signed)
Orthopedic Tech Progress Note Patient Details:  Lonnie MaserCarl Henry [birth year] [id number]  Ortho Devices Type of Ortho Device: Abdominal binder   Shawnie PonsCammer, [name] Carol 01/11/2014, 12:36 PM

## 2014-01-11 NOTE — CV Procedure (Signed)
Central Venous Catheter Insertion Procedure Note Lonnie MaserCarl Henry [id number] April 23, 1951  Procedure: Insertion of Central Venous Catheter Indications: Drug and/or fluid administration  Procedure Details Consent: Risks of procedure as well as the alternatives and risks of each were explained to the (patient/caregiver).  Consent for procedure obtained. Time Out: Verified patient identification, verified procedure, site/side was marked, verified correct patient position, special equipment/implants available, medications/allergies/relevent history reviewed, required imaging and test results available.  Performed  Maximum sterile technique was used including gown. Skin prep: Chlorhexidine; local anesthetic administered A antimicrobial bonded/coated triple lumen catheter was placed in the right internal jugular vein using the Seldinger technique.  Evaluation Blood flow good Complications: No apparent complications Patient did tolerate procedure well. Chest X-ray ordered to verify placement.  CXR: pending.  Lonnie Henry, Lonnie Henry 01/11/2014, 9:06 PM

## 2014-01-11 NOTE — Progress Notes (Signed)
2 Days Post-Op  Subjective: Patient is quite anxious whenever somebody examines him.  Comfortable when he is laying still No flatus yet   Objective: Vital signs in last 24 hours: Temp:  [97.6 F (36.4 C)-98.5 F (36.9 C)] 98.4 F (36.9 C) (03/29 0800) Pulse Rate:  [89-115] 89 (03/29 0411) Resp:  [14-22] 15 (03/29 0411) BP: (100-121)/(53-73) 102/59 mmHg (03/29 0800) SpO2:  [95 %-99 %] 99 % (03/29 0411) Last BM Date: 01/06/14  Intake/Output from previous day: 03/28 0701 - 03/29 0700 In: 1817.1 [I.V.:1727.1; NG/GT:90] Out: 1497.5 [Urine:1275; Emesis/NG output:100; Drains:122.5] Intake/Output this shift: Total I/O In: -  Out: 395 [Urine:325; Drains:70]  General appearance: alert, cooperative and no distress Resp: clear to auscultation bilaterally GI: soft, non-tender; bowel sounds normal; no masses,  no organomegaly Incision c/d/i Drain - large amount of serosanguinous drainage  Lab Results:   Recent Labs  01/10/14 0605 01/11/14 0600  WBC 13.5* 11.0*  HGB 10.0* 8.4*  HCT 28.4* 24.3*  PLT 431* 330   BMET  Recent Labs  01/11/14 0120 01/11/14 0600  NA 159* 158*  K 4.9 4.9  CL 120* 120*  CO2 26 27  GLUCOSE 130* 144*  BUN 49* 53*  CREATININE 3.05* 3.23*  CALCIUM 8.8 9.0   PT/INR  Recent Labs  01/09/14 0532  LABPROT 18.0*  INR 1.53*   ABG No results found for this basename: PHART, PCO2, PO2, HCO3,  in the last 72 hours  Studies/Results: No results found.  Anti-infectives: Anti-infectives   Start     Dose/Rate Route Frequency Ordered Stop   01/09/14 1315  piperacillin-tazobactam (ZOSYN) IVPB 3.375 g     3.375 g 100 mL/hr over 30 Minutes Intravenous STAT 01/09/14 1300 01/09/14 1733   01/08/14 2300  piperacillin-tazobactam (ZOSYN) IVPB 3.375 g     3.375 g 12.5 mL/hr over 240 Minutes Intravenous Every 8 hours 01/08/14 1447     01/08/14 1500  piperacillin-tazobactam (ZOSYN) IVPB 3.375 g     3.375 g 100 mL/hr over 30 Minutes Intravenous  Once  01/08/14 1447 01/08/14 1540      Assessment/Plan: s/p Procedure(s): EXPLORATORY LAPAROTOMY (N/A) LYSIS OF ADHESION (N/A) APPENDECTOMY (N/A) DRAINAGE OF INTRAABDOMINAL ABSCESS (N/A) Internal medicine service to address metabolic/ electrolyte derangement with increasing Cr. Awaiting the return of bowel function - continue NG tube for now. Consider PICC line/ TNA, as it will probably be a few days before he is able to take PO's   LOS: 4 days    Tymir Terral K. 01/11/2014

## 2014-01-11 NOTE — Progress Notes (Signed)
ANTIBIOTIC CONSULT NOTE - FOLLOW UP  Pharmacy Consult for Zosyn Indication: peritonitis  No Known Allergies  Patient Measurements: Height: 6\' 2"  (188 cm) Weight: 213 lb 8 oz (96.843 kg) IBW/kg (Calculated) : 82.2  Vital Signs: Temp: 98.4 F (36.9 C) (03/29 0800) Temp src: Oral (03/29 0800) BP: 102/59 mmHg (03/29 0800) Pulse Rate: 89 (03/29 0411) Intake/Output from previous day: 03/28 0701 - 03/29 0700 In: 1817.1 [I.V.:1727.1; NG/GT:90] Out: 1497.5 [Urine:1275; Emesis/NG output:100; Drains:122.5] Intake/Output from this shift: Total I/O In: -  Out: 395 [Urine:325; Drains:70]  Labs:  Recent Labs  01/09/14 0532 01/10/14 0605  01/11/14 0600 01/11/14 0950 01/11/14 1227  WBC 12.4* 13.5*  --  11.0*  --   --   HGB 8.9* 10.0*  --  8.4*  --   --   PLT 368 431*  --  330  --   --   CREATININE 2.34* 2.23*  < > 3.23* 3.38* 3.59*  < > = values in this interval not displayed. Estimated Creatinine Clearance: 25.1 ml/min (by C-G formula based on Cr of 3.59). No results found for this basename: VANCOTROUGH, Leodis BinetVANCOPEAK, VANCORANDOM, GENTTROUGH, GENTPEAK, GENTRANDOM, TOBRATROUGH, TOBRAPEAK, TOBRARND, AMIKACINPEAK, AMIKACINTROU, AMIKACIN,  in the last 72 hours   Microbiology: Recent Results (from the past 720 hour(s))  SURGICAL PCR SCREEN     Status: Abnormal   Collection Time    01/09/14  4:48 AM      Result Value Ref Range Status   MRSA, PCR NEGATIVE  NEGATIVE Final   Staphylococcus aureus POSITIVE (*) NEGATIVE Final   Comment:            The Xpert SA Assay (FDA     approved for NASAL specimens     in patients over 62 years of age),     is one component of     a comprehensive surveillance     program.  Test performance has     been validated by [facility] for patients greater     than or equal to 62 year old.     It is not intended     to diagnose infection nor to     guide or monitor treatment.  ANAEROBIC CULTURE     Status: None   Collection Time    01/09/14  1:46  PM      Result Value Ref Range Status   Specimen Description PERITONEAL FLUID   Final   Special Requests FLUID ON SWAB PT ON ZOSYN   Final   Gram Stain     Final   Value: RARE WBC PRESENT, PREDOMINANTLY MONONUCLEAR     NO SQUAMOUS EPITHELIAL CELLS SEEN     RARE GRAM POSITIVE COCCI     IN PAIRS     Performed at [facility]   Culture     Final   Value: NO ANAEROBES ISOLATED; CULTURE IN PROGRESS FOR 5 DAYS     Note: Gram Stain Report Called to,Read Back By and Verified With: DORA GARDNER ON 01/09/2014 AT 11:27P BY WILEJ     Performed at [facility]   Report Status PENDING   Incomplete  BODY FLUID CULTURE     Status: None   Collection Time    01/09/14  1:46 PM      Result Value Ref Range Status   Specimen Description PERITONEAL FLUID   Final   Special Requests FLUID ON SWAB PT ON ZOSYN   Final   Gram Stain  Final   Value: WBC PRESENT, PREDOMINANTLY MONONUCLEAR     GRAM POSITIVE COCCI     IN PAIRS     Performed at [facility]   Culture     Final   Value: RARE GRAM NEGATIVE RODS     GRAM POSITIVE COCCI IN PAIRS     Note: Gram Stain Report Called to,Read Back By and Verified With: DORA GARDNER ON 01/09/2014 AT 11:27P BY WILEJ     Performed at [facility]   Report Status PENDING   Incomplete    Anti-infectives   Start     Dose/Rate Route Frequency Ordered Stop   01/09/14 1315  piperacillin-tazobactam (ZOSYN) IVPB 3.375 g     3.375 g 100 mL/hr over 30 Minutes Intravenous STAT 01/09/14 1300 01/09/14 1733   01/08/14 2300  piperacillin-tazobactam (ZOSYN) IVPB 3.375 g     3.375 g 12.5 mL/hr over 240 Minutes Intravenous Every 8 hours 01/08/14 1447     01/08/14 1500  piperacillin-tazobactam (ZOSYN) IVPB 3.375 g     3.375 g 100 mL/hr over 30 Minutes Intravenous  Once 01/08/14 1447 01/08/14 1540      Assessment: 63 year old male on Day #4 of Zosyn empirically for peritonitis.  His SCr is worsening but his estimated clearance is still adequate for  extended infusion Zosyn.  Plan:  Continue Zosyn 3.375gm IV q8h extended infusion Monitor renal function closely as Zosyn may require adjustment if his SCr continues to rise  Estella Husk, Pharm.D., BCPS, AAHIVP Clinical Pharmacist Phone: [phone] or [phone] 01/11/2014, 1:52 PM

## 2014-01-11 NOTE — Progress Notes (Signed)
Subjective: Patient seen and examined this morning at the bedside. He is somnolent but arousable. He knows his name, that he is at [facility] in [place], and that the year is 48. He still has significant pain, worse in the RLQ. He has trouble taking deep breaths with the pain, but I encouraged incentive spirometry. He is using his PCA. Still no flatus or BM.  Overnight his fluids were changed to D5W @150cc /hr due to persistent hypernatremia.  Objective: Vital signs in last 24 hours: Filed Vitals:   01/10/14 1923 01/10/14 2346 01/11/14 0411 01/11/14 0800  BP: 121/68 111/59 100/53   Pulse: 115 103 89   Temp: 98.1 F (36.7 C) 97.8 F (36.6 C) 97.6 F (36.4 C) 98.4 F (36.9 C)  TempSrc: Oral Oral Oral Oral  Resp: 22 21 15    Height:      Weight:      SpO2: 95% 97% 99%    Weight change:   Intake/Output Summary (Last 24 hours) at 01/11/14 0848 Last data filed at 01/11/14 0802  Gross per 24 hour  Intake 1817.08 ml  Output 1892.5 ml  Net -75.42 ml   General: alert, cooperative, appears uncomfortable HEENT: PERRL, EOMI, oropharynx clear and non-erythematous, NG tube in place, scleral jaundice Neck: supple Lungs: clear to ascultation bilaterally, normal work of respiration, no wheezes, rales, ronchi Heart: regular rate and rhythm, no murmurs, gallops, or rubs Abdomen: Hypoactive bowel sounds heard in LLQ, but mute in RLQ. Tender to slight palpation in lower abdomen with guarding. Surgical incision covered with dressing that is clean, dry, intact. Drain in place with serosanguinous drainage. Extremities: no cyanosis, clubbing, or edema Neurologic: alert & oriented X3, no asterixis. Moves all extremities voluntairly  Lab Results: Basic Metabolic Panel:  Recent Labs Lab 01/09/14 0532 01/10/14 0605  01/11/14 0120 01/11/14 0600  NA 150* 159*  < > 159* 158*  K 4.2 5.2  < > 4.9 4.9  CL 112 119*  < > 120* 120*  CO2 24 26  < > 26 27  GLUCOSE 106* 132*  < > 130* 144*   BUN 61* 44*  < > 49* 53*  CREATININE 2.34* 2.23*  < > 3.05* 3.23*  CALCIUM 9.2 9.1  < > 8.8 9.0  MG  --  2.3  --   --   --   < > = values in this interval not displayed. Liver Function Tests:  Recent Labs Lab 01/10/14 0605 01/11/14 0600  AST 33 65*  ALT 49 54*  ALKPHOS 83 61  BILITOT 6.4* 5.2*  PROT 7.0 6.9  ALBUMIN 2.1* 2.0*    Recent Labs Lab 01/06/14 2345  LIPASE 24   CBC:  Recent Labs Lab 01/06/14 2345  01/10/14 0605 01/11/14 0600  WBC 10.7*  < > 13.5* 11.0*  NEUTROABS 8.4*  --   --   --   HGB 11.6*  < > 10.0* 8.4*  HCT 31.4*  < > 28.4* 24.3*  MCV 91.0  < > 96.6 97.6  PLT 288  < > 431* 330  < > = values in this interval not displayed. Cardiac Enzymes:  Recent Labs Lab 01/08/14 1208  CKTOTAL 265*   CBG:  Recent Labs Lab 01/09/14 2353 01/10/14 0616 01/10/14 1147 01/10/14 1848 01/10/14 2342 01/11/14 0622  GLUCAP 107* 123* 114* 125* 135* 129*   Coagulation:  Recent Labs Lab 01/09/14 0532  LABPROT 18.0*  INR 1.53*   Urinalysis:  Recent Labs Lab 01/07/14 [place]*  LABSPEC 1.018  PHURINE 5.0  GLUCOSEU NEGATIVE  HGBUR MODERATE*  BILIRUBINUR SMALL*  KETONESUR NEGATIVE  PROTEINUR 30*  UROBILINOGEN 1.0  NITRITE NEGATIVE  LEUKOCYTESUR NEGATIVE    Micro Results: Recent Results (from the past 240 hour(s))  SURGICAL PCR SCREEN     Status: Abnormal   Collection Time    Jan 22, 2014  4:48 AM      Result Value Ref Range Status   MRSA, PCR NEGATIVE  NEGATIVE Final   Staphylococcus aureus POSITIVE (*) NEGATIVE Final   Comment:            The Xpert SA Assay (FDA     approved for NASAL specimens     in patients over 47 years of age),     is one component of     a comprehensive surveillance     program.  Test performance has     been validated by [employer] for patients greater     than or equal to 86 year old.     It is not intended     to diagnose infection nor to     guide or monitor treatment.  ANAEROBIC  CULTURE     Status: None   Collection Time    Jan 22, 2014  1:46 PM      Result Value Ref Range Status   Specimen Description PERITONEAL FLUID   Final   Special Requests FLUID ON SWAB PT ON ZOSYN   Final   Gram Stain     Final   Value: RARE WBC PRESENT, PREDOMINANTLY MONONUCLEAR     NO SQUAMOUS EPITHELIAL CELLS SEEN     RARE GRAM POSITIVE COCCI     IN PAIRS     Performed at [facility]   Culture     Final   Value: NO ANAEROBES ISOLATED; CULTURE IN PROGRESS FOR 5 DAYS     Note: Gram Stain Report Called to,Read Back By and Verified With: DORA GARDNER ON January 22, 2014 AT 11:27P BY WILEJ     Performed at [facility]   Report Status PENDING   Incomplete  BODY FLUID CULTURE     Status: None   Collection Time    01-22-14  1:46 PM      Result Value Ref Range Status   Specimen Description PERITONEAL FLUID   Final   Special Requests FLUID ON SWAB PT ON ZOSYN   Final   Gram Stain     Final   Value: WBC PRESENT, PREDOMINANTLY MONONUCLEAR     GRAM POSITIVE COCCI     IN PAIRS     Performed at [facility]   Culture     Final   Value: RARE GRAM NEGATIVE RODS     GRAM POSITIVE COCCI IN PAIRS     Note: Gram Stain Report Called to,Read Back By and Verified With: DORA GARDNER ON January 22, 2014 AT 11:27P BY WILEJ     Performed at [facility]   Report Status PENDING   Incomplete   Studies/Results: Dg Abd 2 Views  01/22/2014   CLINICAL DATA:  Right mid to lateral abdominal pain and swelling  EXAM: ABDOMEN - 2 VIEW  COMPARISON:  CT ABD/PELV WO CM dated 01/07/2014; DG ABD ACUTE W/CHEST dated 01/07/2014  FINDINGS: There is marked gaseous distention of multiple rather featureless loops of small bowel with index loop of small bowel in the left upper abdominal quadrant measuring approximately 4.8 cm in diameter. Multiple air-fluid levels are noted  on the upright radiograph. No pneumoperitoneum or pneumatosis. A minimal amount of enteric contrast is seen within the ascending colon.   Enteric tube tip and side port projects over the expected location of the gastric fundus.  Limited visualization the lower thorax demonstrates bibasilar opacities and possible trace bilateral effusions.  Degenerative change of the lumbar spine suspected though incompletely evaluated.  IMPRESSION: 1. Similar findings compatible with high-grade partial small bowel obstruction. 2. Enteric tube tip and side port projects over the gastric fundus.   Electronically Signed   By: Sandi Mariscal M.D.   On: 01/09/2014 09:14   Medications: I have reviewed the patient's current medications. Scheduled Meds: . antiseptic oral rinse  15 mL Mouth Rinse q12n4p  . chlorhexidine  15 mL Mouth Rinse BID  . Chlorhexidine Gluconate Cloth  6 each Topical Daily  . folic acid  1 mg Oral Daily  . heparin  5,000 Units Subcutaneous 3 times per day  . HYDROmorphone PCA 0.3 mg/mL   Intravenous 6 times per day  . multivitamin with minerals  1 tablet Oral Daily  . mupirocin ointment  1 application Nasal BID  . piperacillin-tazobactam (ZOSYN)  IV  3.375 g Intravenous Q8H  . thiamine  100 mg Oral Daily   Or  . thiamine  100 mg Intravenous Daily   Continuous Infusions: . dextrose 150 mL/hr at 01/11/14 0300   PRN Meds:.diphenhydrAMINE, diphenhydrAMINE, LORazepam, LORazepam, morphine injection, naloxone, ondansetron, sodium chloride  Assessment/Plan: The patient is a 63 yo man, presenting with abdominal pain, nausea, and vomiting, found to have a small bowel obstruction and subsequently ruptured appendicitis on ex-lap.  #Acute small bowel obstruction/Peritonitis/Rupture Appendicitis - Now POD#2 for exploratory laparotomy with ruptured appendicitis, SBO, lysis of adhesions. Patient still with exquisitely tender abdomen and not passing flatus, but I heard some bowel sounds this morning. Vital signs stable. - Appreciate surgery recs  - Continue NPO, NG tube  - Continue PCA pump, zofran for symptomatic relief  - Continue Zosyn  IV  #Hypernatremia/hyperchloremia - Likely iatrogenic from aggressive IVF resuscitation. Was still trending up with D5-1/2NS, so he was converted to D5W overnight. - D5W at 171m/hr (should not correct more than 12 mEq/L per 24hours to prevent central pontine myelinolysis) - Monitor BMP closely, q4h   Sodium  Date Value Ref Range Status  01/11/2014 158* 137 - 147 mEq/L Final  01/11/2014 159* 137 - 147 mEq/L Final  01/10/2014 157* 137 - 147 mEq/L Final  01/10/2014 161* 137 - 147 mEq/L Final  01/10/2014 159* 137 - 147 mEq/L Final  01/09/2014 150* 137 - 147 mEq/L Final  01/08/2014 143  137 - 147 mEq/L Final     DELTA CHECK NOTED     RESULTS VERIFIED VIA RECOLLECT  01/07/2014 132* 137 - 147 mEq/L Final    #Acute on chronic anemia - Likely acute blood loss anemia, to be expected POD#2 from abdominal surgery. Patient has a history of macrocytic anemia of unknown origin [facility] Nutritional markers were normal and he even had a bone marrow biopsy that was unremarkable. No gross bleeding. NGT output is bilious. Smear here showed increased bands (>20%), no signs of hemolysis. He has increased bilirubin,but it is predominantly direct. - Continue to monitor  Hemoglobin  Date Value Ref Range Status  01/11/2014 8.4* 13.0 - 17.0 g/dL Final  01/10/2014 10.0* 13.0 - 17.0 g/dL Final  01/09/2014 8.9* 13.0 - 17.0 g/dL Final  01/06/2014 11.6* 13.0 -  17.0 g/dL Final  11/29/2009 12.6* 13.0 - 17.0 g/dL Final    #Acute kidney injury - Presented with a Cr of 8.62, from his last known baseline of 1.2. This likely represented a combination of prerenal azotemia and lisinopril-induced ATN vs. NSAID usage. Cr downtrended with IVF resuscitation, but has been ticking up since his surgery. Suspect continued pre-renal azotemia from insensible losses during surgery/NPO. - IVF per above  - Holding nephrotoxic meds  - Trend BMETs   Creatinine, Ser  Date  Value Ref Range Status  01/11/2014 3.23* 0.50 - 1.35 mg/dL Final  01/11/2014 3.05* 0.50 - 1.35 mg/dL Final  01/10/2014 2.76* 0.50 - 1.35 mg/dL Final  01/10/2014 2.63* 0.50 - 1.35 mg/dL Final  01/10/2014 2.23* 0.50 - 1.35 mg/dL Final  01/09/2014 2.34* 0.50 - 1.35 mg/dL Final  01/08/2014 3.42* 0.50 - 1.35 mg/dL Final     DELTA CHECK NOTED     RESULTS VERIFIED VIA RECOLLECT  01/07/2014 7.05* 0.50 - 1.35 mg/dL Final  01/06/2014 8.62* 0.50 - 1.35 mg/dL Final  11/29/2009 1.2  0.4 - 1.5 mg/dL Final    #Hyperbilirubinemia - Bili labs below, slowly trending down. Mostly direct bilirubinemia. This is likely secondary to hypoperfusion from hypovolemia, but intrahepatic process could also be responsible. Hepatitis panel negative. HIV NR. RUQ [place] with no obvious obstruction, but may need additional imaging such as MRCP vs ERCP if no resolution. -CMP in am  Total Bilirubin  Date Value Ref Range Status  01/11/2014 5.2* 0.3 - 1.2 mg/dL Final  01/10/2014 6.4* 0.3 - 1.2 mg/dL Final  01/09/2014 6.3* 0.3 - 1.2 mg/dL Final  01/08/2014 7.3* 0.3 - 1.2 mg/dL Final  01/07/2014 4.1* 0.3 - 1.2 mg/dL Final   Bilirubin     Component Value Date/Time   BILITOT 5.2* 01/11/2014 0600   BILIDIR 3.9* 01/11/2014 0600   IBILI 1.3* 01/11/2014 0600    #Elevated anion gap, resolved - The patient presents with an AG = 22, likely secondary to uremia. Lactate wnl. 11 today. - IVF per above  - Trend BMET    #Alcohol use - The patient drinks 1-3 craft beers/day for at least 5 days per week. His last drink was on St. Patrick's Day (March 17). Withdrawal unlikely at this point, but we will continue to monitor given confusion. Yesterday CIWA 2-6 for tremor, anxiety, orientation/clouding, got 5m Ativan. - CIWA with thiamine and folate  #Prophy - Heparin subq, SCDs  Dispo: Disposition is deferred at this time, awaiting improvement of current medical problems.  Anticipated discharge in approximately 2-3 day(s).   The patient does have a  current PCP (Dionne NVolanda Napoleon MD) and [facility] after [facility].  The patient does not have transportation limitations that hinder transportation to clinic appointments.  .Services Needed at time of discharge: Y = Yes, Blank = No PT:   OT:   RN:   Equipment:   Other:      LOS: 4 days   SLesly Dukes MD 01/11/2014, 8:48 AM  SLesly Dukes MD  [email or web address] Pager # [phone] # [phone]

## 2014-01-12 DIAGNOSIS — D649 Anemia, unspecified: Secondary | ICD-10-CM

## 2014-01-12 LAB — BASIC METABOLIC PANEL
BUN: 51 mg/dL — ABNORMAL HIGH (ref 6–23)
BUN: 51 mg/dL — ABNORMAL HIGH (ref 6–23)
BUN: 49 mg/dL — ABNORMAL HIGH (ref 6–23)
BUN: 49 mg/dL — ABNORMAL HIGH (ref 6–23)
BUN: 49 mg/dL — ABNORMAL HIGH (ref 6–23)
BUN: 47 mg/dL — ABNORMAL HIGH (ref 6–23)
BUN: 49 mg/dL — ABNORMAL HIGH (ref 6–23)
BUN: 48 mg/dL — AB (ref 6–23)
BUN: 47 mg/dL — AB (ref 6–23)
BUN: 48 mg/dL — AB (ref 6–23)
CALCIUM: 8.6 mg/dL (ref 8.4–10.5)
CALCIUM: 8.6 mg/dL (ref 8.4–10.5)
CALCIUM: 8.1 mg/dL — AB (ref 8.4–10.5)
CHLORIDE: 112 meq/L (ref 96–112)
CHLORIDE: 110 meq/L (ref 96–112)
CHLORIDE: 109 meq/L (ref 96–112)
CHLORIDE: 107 meq/L (ref 96–112)
CHLORIDE: 104 meq/L (ref 96–112)
CO2: 27 meq/L (ref 19–32)
CO2: 27 mEq/L (ref 19–32)
CO2: 27 mEq/L (ref 19–32)
CO2: 28 mEq/L (ref 19–32)
CO2: 26 mEq/L (ref 19–32)
CO2: 25 mEq/L (ref 19–32)
CO2: 26 meq/L (ref 19–32)
CO2: 25 meq/L (ref 19–32)
CO2: 25 mEq/L (ref 19–32)
CO2: 26 meq/L (ref 19–32)
CREATININE: 3.6 mg/dL — AB (ref 0.50–1.35)
CREATININE: 3.53 mg/dL — AB (ref 0.50–1.35)
CREATININE: 3.56 mg/dL — AB (ref 0.50–1.35)
CREATININE: 3.49 mg/dL — AB (ref 0.50–1.35)
Calcium: 8.6 mg/dL (ref 8.4–10.5)
Calcium: 8.1 mg/dL — ABNORMAL LOW (ref 8.4–10.5)
Calcium: 8.4 mg/dL (ref 8.4–10.5)
Calcium: 8.4 mg/dL (ref 8.4–10.5)
Calcium: 8.5 mg/dL (ref 8.4–10.5)
Calcium: 8 mg/dL — ABNORMAL LOW (ref 8.4–10.5)
Calcium: 8.3 mg/dL — ABNORMAL LOW (ref 8.4–10.5)
Chloride: 113 mEq/L — ABNORMAL HIGH (ref 96–112)
Chloride: 112 mEq/L (ref 96–112)
Chloride: 110 mEq/L (ref 96–112)
Chloride: 110 mEq/L (ref 96–112)
Chloride: 108 mEq/L (ref 96–112)
Creatinine, Ser: 3.64 mg/dL — ABNORMAL HIGH (ref 0.50–1.35)
Creatinine, Ser: 3.56 mg/dL — ABNORMAL HIGH (ref 0.50–1.35)
Creatinine, Ser: 3.58 mg/dL — ABNORMAL HIGH (ref 0.50–1.35)
Creatinine, Ser: 3.52 mg/dL — ABNORMAL HIGH (ref 0.50–1.35)
Creatinine, Ser: 3.6 mg/dL — ABNORMAL HIGH (ref 0.50–1.35)
Creatinine, Ser: 3.55 mg/dL — ABNORMAL HIGH (ref 0.50–1.35)
GFR calc Af Amer: 19 mL/min — ABNORMAL LOW (ref >90)
GFR calc Af Amer: 20 mL/min — ABNORMAL LOW (ref >90)
GFR calc Af Amer: 20 mL/min — ABNORMAL LOW (ref >90)
GFR calc Af Amer: 20 mL/min — ABNORMAL LOW (ref >90)
GFR calc Af Amer: 20 mL/min — ABNORMAL LOW (ref >90)
GFR calc Af Amer: 20 mL/min — ABNORMAL LOW (ref >90)
GFR calc non Af Amer: 17 mL/min — ABNORMAL LOW (ref >90)
GFR calc non Af Amer: 17 mL/min — ABNORMAL LOW (ref >90)
GFR calc non Af Amer: 17 mL/min — ABNORMAL LOW (ref >90)
GFR calc non Af Amer: 17 mL/min — ABNORMAL LOW (ref >90)
GFR calc non Af Amer: 17 mL/min — ABNORMAL LOW (ref >90)
GFR calc non Af Amer: 17 mL/min — ABNORMAL LOW (ref >90)
GFR calc non Af Amer: 17 mL/min — ABNORMAL LOW (ref >90)
GFR, EST AFRICAN AMERICAN: 20 mL/min — AB (ref >90)
GFR, EST AFRICAN AMERICAN: 20 mL/min — AB (ref >90)
GFR, EST AFRICAN AMERICAN: 20 mL/min — AB (ref >90)
GFR, EST AFRICAN AMERICAN: 20 mL/min — AB (ref >90)
GFR, EST NON AFRICAN AMERICAN: 17 mL/min — AB (ref >90)
GFR, EST NON AFRICAN AMERICAN: 17 mL/min — AB (ref >90)
GFR, EST NON AFRICAN AMERICAN: 17 mL/min — AB (ref >90)
GLUCOSE: 154 mg/dL — AB (ref 70–99)
GLUCOSE: 156 mg/dL — AB (ref 70–99)
GLUCOSE: 108 mg/dL — AB (ref 70–99)
Glucose, Bld: 153 mg/dL — ABNORMAL HIGH (ref 70–99)
Glucose, Bld: 164 mg/dL — ABNORMAL HIGH (ref 70–99)
Glucose, Bld: 164 mg/dL — ABNORMAL HIGH (ref 70–99)
Glucose, Bld: 121 mg/dL — ABNORMAL HIGH (ref 70–99)
Glucose, Bld: 96 mg/dL (ref 70–99)
Glucose, Bld: 150 mg/dL — ABNORMAL HIGH (ref 70–99)
Glucose, Bld: 117 mg/dL — ABNORMAL HIGH (ref 70–99)
POTASSIUM: 4.3 meq/L (ref 3.7–5.3)
POTASSIUM: 4.8 meq/L (ref 3.7–5.3)
POTASSIUM: 4.3 meq/L (ref 3.7–5.3)
POTASSIUM: 4.3 meq/L (ref 3.7–5.3)
Potassium: 4.4 mEq/L (ref 3.7–5.3)
Potassium: 4.4 mEq/L (ref 3.7–5.3)
Potassium: 4.4 mEq/L (ref 3.7–5.3)
Potassium: 4.2 mEq/L (ref 3.7–5.3)
Potassium: 3.9 mEq/L (ref 3.7–5.3)
Potassium: 4 mEq/L (ref 3.7–5.3)
SODIUM: 153 meq/L — AB (ref 137–147)
SODIUM: 152 meq/L — AB (ref 137–147)
SODIUM: 150 meq/L — AB (ref 137–147)
SODIUM: 147 meq/L (ref 137–147)
Sodium: 150 mEq/L — ABNORMAL HIGH (ref 137–147)
Sodium: 152 mEq/L — ABNORMAL HIGH (ref 137–147)
Sodium: 149 mEq/L — ABNORMAL HIGH (ref 137–147)
Sodium: 148 mEq/L — ABNORMAL HIGH (ref 137–147)
Sodium: 146 mEq/L (ref 137–147)
Sodium: 145 mEq/L (ref 137–147)

## 2014-01-12 LAB — CBC
HEMATOCRIT: 23.1 % — AB (ref 39.0–52.0)
HEMOGLOBIN: 7.9 g/dL — AB (ref 13.0–17.0)
MCH: 34.1 pg — AB (ref 26.0–34.0)
MCHC: 34.2 g/dL (ref 30.0–36.0)
MCV: 99.6 fL (ref 78.0–100.0)
Platelets: 286 10*3/uL (ref 150–400)
RBC: 2.32 MIL/uL — AB (ref 4.22–5.81)
RDW: 13.9 % (ref 11.5–15.5)
WBC: 8.5 10*3/uL (ref 4.0–10.5)

## 2014-01-12 LAB — GLUCOSE, CAPILLARY
GLUCOSE-CAPILLARY: 141 mg/dL — AB (ref 70–99)
GLUCOSE-CAPILLARY: 139 mg/dL — AB (ref 70–99)
Glucose-Capillary: 100 mg/dL — ABNORMAL HIGH (ref 70–99)
Glucose-Capillary: 148 mg/dL — ABNORMAL HIGH (ref 70–99)

## 2014-01-12 LAB — HEPATIC FUNCTION PANEL
ALT: 76 U/L — ABNORMAL HIGH (ref 0–53)
AST: 130 U/L — AB (ref 0–37)
Albumin: 2 g/dL — ABNORMAL LOW (ref 3.5–5.2)
Alkaline Phosphatase: 59 U/L (ref 39–117)
BILIRUBIN INDIRECT: 1.2 mg/dL — AB (ref 0.3–0.9)
BILIRUBIN TOTAL: 3.6 mg/dL — AB (ref 0.3–1.2)
Bilirubin, Direct: 2.4 mg/dL — ABNORMAL HIGH (ref 0.0–0.3)
Total Protein: 7.1 g/dL (ref 6.0–8.3)

## 2014-01-12 LAB — BODY FLUID CULTURE

## 2014-01-12 LAB — PHOSPHORUS: Phosphorus: 3.6 mg/dL (ref 2.3–4.6)

## 2014-01-12 LAB — MAGNESIUM: MAGNESIUM: 2.1 mg/dL (ref 1.5–2.5)

## 2014-01-12 LAB — LACTIC ACID, PLASMA: Lactic Acid, Venous: 1.2 mmol/L (ref 0.5–2.2)

## 2014-01-12 MED ORDER — FAT EMULSION 20 % IV EMUL
240.0000 mL | INTRAVENOUS | Status: AC
  Administered 2014-01-12: 240 mL via INTRAVENOUS
  Filled 2014-01-12: qty 250

## 2014-01-12 MED ORDER — TRACE MINERALS CR-CU-F-FE-I-MN-MO-SE-ZN IV SOLN
INTRAVENOUS | Status: AC
  Administered 2014-01-12: 17:00:00 via INTRAVENOUS
  Filled 2014-01-12: qty 1000

## 2014-01-12 MED ORDER — INSULIN ASPART 100 UNIT/ML ~~LOC~~ SOLN
0.0000 [IU] | Freq: Four times a day (QID) | SUBCUTANEOUS | Status: DC
Start: 2014-01-13 — End: 2014-01-18
  Administered 2014-01-13 – 2014-01-18 (×19): 1 [IU] via SUBCUTANEOUS

## 2014-01-12 NOTE — Progress Notes (Signed)
PARENTERAL NUTRITION CONSULT NOTE - INITIAL  Pharmacy Consult for TPN Indication: Prolonged ileus  No Known Allergies  Patient Measurements: Height: 6\' 2"  (188 cm) Weight: 201 lb 4.5 oz (91.3 kg) IBW/kg (Calculated) : 82.2   Vital Signs: Temp: 97.8 F (36.6 C) (03/30 0800) Temp src: Oral (03/30 0800) BP: 98/59 mmHg (03/30 0800) Pulse Rate: 83 (03/30 0348) Intake/Output from previous day: 03/29 0701 - 03/30 0700 In: 5743.7 [I.V.:5583.7; NG/GT:60; IV Piggyback:100] Out: 2757.5 [Urine:1900; Emesis/NG output:600; Drains:257.5] Intake/Output from this shift: Total I/O In: -  Out: 260 [Urine:250; Drains:10]  Labs:  Recent Labs  01/10/14 0605 01/11/14 0600 01/12/14 0300  WBC 13.5* 11.0* 8.5  HGB 10.0* 8.4* 7.9*  HCT 28.4* 24.3* 23.1*  PLT 431* 330 286     Recent Labs  01/10/14 0605  01/11/14 0600  01/12/14 0300 01/12/14 0301 01/12/14 0501 01/12/14 0648  NA 159*  < > 158*  < >  --  150* 152* 149*  K 5.2  < > 4.9  < >  --  4.8 4.4 4.4  CL 119*  < > 120*  < >  --  110 112 110  CO2 26  < > 27  < >  --  27 28 26   GLUCOSE 132*  < > 144*  < >  --  164* 164* 156*  BUN 44*  < > 53*  < >  --  49* 49* 49*  CREATININE 2.23*  < > 3.23*  < >  --  3.58* 3.60* 3.52*  CALCIUM 9.1  < > 9.0  < >  --  8.1* 8.4 8.6  MG 2.3  --   --   --   --   --   --   --   PROT 7.0  --  6.9  --  7.1  --   --   --   ALBUMIN 2.1*  --  2.0*  --  2.0*  --   --   --   AST 33  --  65*  --  130*  --   --   --   ALT 49  --  54*  --  76*  --   --   --   ALKPHOS 83  --  61  --  59  --   --   --   BILITOT 6.4*  --  5.2*  --  3.6*  --   --   --   BILIDIR 4.8*  --  3.9*  --  2.4*  --   --   --   IBILI  --   --  1.3*  --  1.2*  --   --   --   < > = values in this interval not displayed. Estimated Creatinine Clearance: 25.6 ml/min (by C-G formula based on Cr of 3.52).    Recent Labs  01/11/14 1721 01/11/14 2350 01/12/14 0614  GLUCAP 133* 148* 141*    Medical History: Past Medical History   Diagnosis Date  . Hypertension     Dr. Angela Nevinion Homes (PCP)  . Hypercholesterolemia   . [facility] 2011    [facility]  . SBO (small bowel obstruction) 01/07/2014    Medications:  Prescriptions prior to admission  Medication Sig Dispense Refill  . co-enzyme Q-10 30 MG capsule Take 30 mg by mouth daily.      Marland Kitchen. lisinopril (PRINIVIL,ZESTRIL) 20 MG tablet Take 30-40 mg by mouth daily.      .Marland Kitchen  Multiple Vitamin (MULTIVITAMIN WITH MINERALS) TABS tablet Take 1 tablet by mouth daily.      . naproxen sodium (ANAPROX) 220 MG tablet Take 440 mg by mouth daily as needed (for pain).      . Omega-3 Fatty Acids (FISH OIL) 1000 MG CAPS Take 1,000 mg by mouth daily.      Marland Kitchen omeprazole (PRILOSEC) 20 MG capsule Take 20 mg by mouth daily.      . simvastatin (ZOCOR) 20 MG tablet Take 20 mg by mouth daily at 6 PM.        Insulin Requirements in the past 24 hours:  None  Current Nutrition:  NPO, D5 at 287ml/hr provides 1020 kcal per 24 hrs  Nutritional Goals:  Await RD recommendations  Assessment: Admit: Admitted with SBO.  GI: SBO this admit- no flatus or BM. S/p ex-lap on 3/27 for SBO and found to have perforated appy and intra abd abscess, s/p appendectomy.  Remains NPO, NGT 600 out yesterday. To start TPN today for prolonged ileus.  Endo: No hx. CBGs 140-150 range - pt on D5 at 250 ml/hr for hypernatremia.  Lytes: Na 149 -improving- remains on D5 250 ml/hr. K 4.4, corrected Ca 10.2. Mg, Phos pending.  Renal: ARI on admit. SCr initially improved, now worsening 3.52. UOP 0.9 ml/kg/hr. Watch I/O with MIVF at 284ml/hr.  Pulm: RA  Cards: Hx HTN/DL. BP ok, HR wnl-levated. On no CV meds  Hepatobil: AST/ALT trending up. Albumin remains low. Tbili 3.6 - trending down.  Neuro: Hx CVA. No secondary stroke px -- f/u ASA (sticky note left). Started on CIWA protocol on 3/28.  ID: Zosyn D#5 for SBO and intra-abd infection. Afeb, WBC 11  Best Practices: SQ hep  TPN Access: Triple lumen CVC  3/29  TPN day#: 0  Plan:  1) Will start clinimix E 5/15 at 86ml/hr - starting low TPN rate with high rate of D5. TPN + D5 will provide dextrose infusion rate of 3.1 mcg/kg/min; 1991 kcal; 36 gm protein. Will advance to goal as tolerated. (Will not be able to meet protein goals with D5 rate so high.) 2) Will continue CBG monitoring q6h and add sensitive SSI coverage. 3) Will allow MD to decrease MIVF based on Na levels 4) IV Multivitamin daily in TPN - will d/c oral MVI as pt not receiving 5) Trace elements M/W/F due to national shortage 6) TPN labs 7) Will f/u RD recommendations  Christoper Fabian, PharmD, BCPS Clinical pharmacist, pager [phone] 01/12/2014,9:26 AM

## 2014-01-12 NOTE — Progress Notes (Signed)
Utilization review completed.  

## 2014-01-12 NOTE — Progress Notes (Signed)
Agree with above 

## 2014-01-12 NOTE — Progress Notes (Signed)
Subjective: Patient seen and examined this morning at the bedside. He is sitting in the recliner. His abdominal pain is still quite bad, but he is trying to move around as much as possible. No flatus or BM.  NGT 648m dark bilious output. A cental line was placed overnight for ease of blood draws.  Objective: Vital signs in last 24 hours: Filed Vitals:   01/11/14 2353 01/12/14 0348 01/12/14 0800 01/12/14 1116  BP: 107/68 110/65 98/59   Pulse: 77 83    Temp: 98 F (36.7 C) 98.1 F (36.7 C) 97.8 F (36.6 C) 99.6 F (37.6 C)  TempSrc: Oral Oral Oral Oral  Resp: 14 17    Height:      Weight:  201 lb 4.5 oz (91.3 kg)    SpO2: 100% 100%     Weight change:   Intake/Output Summary (Last 24 hours) at 01/12/14 1346 Last data filed at 01/12/14 1127  Gross per 24 hour  Intake 5693.67 ml  Output 2452.5 ml  Net 3241.17 ml   General: alert, cooperative, appears uncomfortable HEENT: PERRL, EOMI, oropharynx clear and non-erythematous, NG tube in place, scleral jaundice Neck: supple Lungs: clear to ascultation bilaterally, normal work of respiration, no wheezes, rales, ronchi Heart: regular rate and rhythm, no murmurs, gallops, or rubs Abdomen: Abdominal binder in place. No bowel sounds today. Tender to even slight palpation in lower abdomen with guarding. Surgical incision covered with dressing that is clean, dry, intact. Extremities: no cyanosis, clubbing, or edema Neurologic: alert & oriented X3, no asterixis. Moves all extremities voluntarily  Lab Results: Basic Metabolic Panel:  Recent Labs Lab 01/10/14 0605  01/12/14 0648 01/12/14 1100  NA 159*  < > 149* 150*  K 5.2  < > 4.4 4.3  CL 119*  < > 110 110  CO2 26  < > 26 25  GLUCOSE 132*  < > 156* 108*  BUN 44*  < > 49* 47*  CREATININE 2.23*  < > 3.52* 3.60*  CALCIUM 9.1  < > 8.6 8.4  MG 2.3  --   --  2.1  < > = values in this interval not displayed. Liver Function Tests:  Recent Labs Lab 01/11/14 0600 01/12/14 0300    AST 65* 130*  ALT 54* 76*  ALKPHOS 61 59  BILITOT 5.2* 3.6*  PROT 6.9 7.1  ALBUMIN 2.0* 2.0*    Recent Labs Lab 01/06/14 2345  LIPASE 24   CBC:  Recent Labs Lab 01/06/14 2345  01/11/14 0600 01/12/14 0300  WBC 10.7*  < > 11.0* 8.5  NEUTROABS 8.4*  --   --   --   HGB 11.6*  < > 8.4* 7.9*  HCT 31.4*  < > 24.3* 23.1*  MCV 91.0  < > 97.6 99.6  PLT 288  < > 330 286  < > = values in this interval not displayed. Cardiac Enzymes:  Recent Labs Lab 01/08/14 1208  CKTOTAL 265*   CBG:  Recent Labs Lab 01/11/14 0622 01/11/14 1211 01/11/14 1721 01/11/14 2350 01/12/14 0614 01/12/14 1123  GLUCAP 129* 111* 133* 148* 141* 100*   Coagulation:  Recent Labs Lab 01/09/14 0532  LABPROT 18.0*  INR 1.53*   Urinalysis:  Recent Labs Lab 01/07/14 0734  COLORURINE AMBER*  LABSPEC 1.018  PHURINE 5.0  GLUCOSEU NEGATIVE  HGBUR MODERATE*  BILIRUBINUR SMALL*  KETONESUR NEGATIVE  PROTEINUR 30*  UROBILINOGEN 1.0  NITRITE NEGATIVE  LEUKOCYTESUR NEGATIVE    Micro Results: Recent Results (from the past 240  hour(s))  SURGICAL PCR SCREEN     Status: Abnormal   Collection Time    01/09/14  4:48 AM      Result Value Ref Range Status   MRSA, PCR NEGATIVE  NEGATIVE Final   Staphylococcus aureus POSITIVE (*) NEGATIVE Final   Comment:            The Xpert SA Assay (FDA     approved for NASAL specimens     in patients over 63 years of age),     is one component of     a comprehensive surveillance     program.  Test performance has     been validated by [employer] for patients greater     than or equal to 53 year old.     It is not intended     to diagnose infection nor to     guide or monitor treatment.  ANAEROBIC CULTURE     Status: None   Collection Time    01/09/14  1:46 PM      Result Value Ref Range Status   Specimen Description PERITONEAL FLUID   Final   Special Requests FLUID ON SWAB PT ON ZOSYN   Final   Gram Stain     Final   Value: RARE WBC PRESENT,  PREDOMINANTLY MONONUCLEAR     NO SQUAMOUS EPITHELIAL CELLS SEEN     RARE GRAM POSITIVE COCCI     IN PAIRS     Performed at [facility]   Culture     Final   Value: NO ANAEROBES ISOLATED; CULTURE IN PROGRESS FOR 5 DAYS     Note: Gram Stain Report Called to,Read Back By and Verified With: DORA GARDNER ON 01/09/2014 AT 11:27P BY WILEJ     Performed at [facility]   Report Status PENDING   Incomplete  BODY FLUID CULTURE     Status: None   Collection Time    01/09/14  1:46 PM      Result Value Ref Range Status   Specimen Description PERITONEAL FLUID   Final   Special Requests FLUID ON SWAB PT ON ZOSYN   Final   Gram Stain     Final   Value: WBC PRESENT, PREDOMINANTLY MONONUCLEAR     GRAM POSITIVE COCCI     IN PAIRS     Performed at [facility]   Culture     Final   Value: RARE ESCHERICHIA COLI     MODERATE STREPTOCOCCUS GROUP C     Note: Gram Stain Report Called to,Read Back By and Verified With: DORA GARDNER ON 01/09/2014 AT 11:27P BY WILEJ     Performed at [facility]   Report Status 01/12/2014 FINAL   Final   Organism ID, Bacteria ESCHERICHIA COLI   Final   Organism ID, Bacteria STREPTOCOCCUS GROUP C   Final   Studies/Results: Dg Chest Port 1 View  01/11/2014   CLINICAL DATA:  Central line placement  EXAM: PORTABLE CHEST - 1 VIEW  COMPARISON:  Prior acute abdominal series and chest x-ray 01/07/2014  FINDINGS: Interval placement of a right IJ approach central venous catheter. The catheter tip projects over the mid SVC. A nasogastric tube is been placed, the tip lies off the field of view but below the diaphragm and likely within the stomach or proximal small bowel. Increasing right lower lobe opacity favored to reflect progressive atelectasis. Cardiac and mediastinal contours remain unchanged. No evidence  of pneumothorax. No acute osseous abnormality.  IMPRESSION: 1. The tip of the new right IJ central venous catheter projects over the mid SVC. No  evidence of complicating pneumothorax. 2. A nasogastric tube is present. The tip lies below the diaphragm off the field of view presumably within the stomach or proximal small bowel. 3. Increasing right basilar opacity favored to reflect atelectasis. Developing infiltrate difficult to exclude.   Electronically Signed   By: Jacqulynn Cadet M.D.   On: 01/11/2014 21:35   Medications: I have reviewed the patient's current medications. Scheduled Meds: . antiseptic oral rinse  15 mL Mouth Rinse q12n4p  . chlorhexidine  15 mL Mouth Rinse BID  . Chlorhexidine Gluconate Cloth  6 each Topical Daily  . folic acid  1 mg Oral Daily  . heparin  5,000 Units Subcutaneous 3 times per day  . HYDROmorphone PCA 0.3 mg/mL   Intravenous 6 times per day  . [START ON 01/13/2014] insulin aspart  0-9 Units Subcutaneous 4 times per day  . mupirocin ointment  1 application Nasal BID  . piperacillin-tazobactam (ZOSYN)  IV  3.375 g Intravenous Q8H  . thiamine  100 mg Oral Daily   Or  . thiamine  100 mg Intravenous Daily   Continuous Infusions: . dextrose 250 mL (01/12/14 0846)  . Marland KitchenTPN (CLINIMIX-E) Adult     And  . fat emulsion     PRN Meds:.diphenhydrAMINE, diphenhydrAMINE, LORazepam, LORazepam, morphine injection, naloxone, ondansetron, sodium chloride  Assessment/Plan: The patient is a 63 yo man, presenting with abdominal pain, nausea, and vomiting, found to have a small bowel obstruction and subsequently ruptured appendicitis on ex-lap.  #Acute small bowel obstruction/Peritonitis/Rupture Appendicitis - Now POD#3 for exploratory laparotomy with ruptured appendicitis, SBO, lysis of adhesions. Patient still with exquisitely tender abdomen and not passing flatus. Vital signs stable. - Appreciate surgery recs  - Continue NPO, NG tube  - Continue PCA Dilaudid pump, zofran for symptomatic relief  - Continue Zosyn IV - Incentive spirometry - Mobilize OOB, PT eval and treat - Continue abdominal  binder  #Hypernatremia/hyperchloremia - Likely iatrogenic from aggressive IVF resuscitation combined with NGT losses and insensible losses from surgery. Still with a large free water deficit. A central line was placed overnight to allow nurse to draw serial BMPs. Slow but steady trend down overnight. Goal Na ~145 before we transition fluids. - Continue D5W at 276m/hr (should not correct more than 12 mEq/L per 24hours to prevent central pontine myelinolysis) - Monitor BMP closely, q3h  Sodium  Date Value Ref Range Status  01/12/2014 150* 137 - 147 mEq/L Final  01/12/2014 149* 137 - 147 mEq/L Final  01/12/2014 152* 137 - 147 mEq/L Final  01/12/2014 150* 137 - 147 mEq/L Final  01/12/2014 152* 137 - 147 mEq/L Final  01/11/2014 153* 137 - 147 mEq/L Final  01/11/2014 154* 137 - 147 mEq/L Final  01/11/2014 154* 137 - 147 mEq/L Final    #Refeeding - Dietitian and pharmacy consulted for TPN given prolonged n.p.o. status. - Appreciate nutrition and pharmacy recs - Starting clinimix E 5/15 at 397mhr - starting low TPN rate with high rate of D5. TPN + D5 will provide dextrose infusion rate of 3.1 mcg/kg/min; 1991 kcal; 36 gm protein. Will advance to goal as tolerated. (Will not be able to meet protein goals with D5 rate so high.)  - Will continue CBG monitoring q6h and add sensitive SSI coverage.  - Will allow MD to decrease maintenance IVF based on Na levels  -  IV Multivitamin daily in TPN - will d/c oral MVI as pt not receiving  - Trace elements M/W/F due to national shortage  - TPN labs (including monitor magnesium, potassium, and phosphorus daily for at least 3 days)  #Acute on chronic anemia - Likely dilutional plus acute blood loss anemia, to be expected s/p abdominal surgery. Also iatrogenic from serial blood draws.  Patient has a history of macrocytic anemia of unknown origin that was worked up extensively at [facility] (records are in [place]). Nutritional markers were normal and he even  had a bone marrow biopsy that was unremarkable. No gross bleeding. NGT output is bilious. Smear here showed increased bands (>20%), no signs of hemolysis.  - Continue to monitor - Transfusion threshold Hgb <7  Hemoglobin  Date Value Ref Range Status  01/12/2014 7.9* 13.0 - 17.0 g/dL Final  01/11/2014 8.4* 13.0 - 17.0 g/dL Final  01/10/2014 10.0* 13.0 - 17.0 g/dL Final  01/09/2014 8.9* 13.0 - 17.0 g/dL Final  01/06/2014 11.6* 13.0 - 17.0 g/dL Final    #Acute kidney injury - Presented with a Cr of 8.62, from his last known baseline of 1.2. This likely represented a combination of prerenal azotemia and lisinopril-induced ATN vs. NSAID usage. Cr downtrended with IVF resuscitation, now stabilized at ~3.6. Suspect continued pre-renal azotemia from insensible losses during surgery/NPO. We did not placed a PICC for TPN to preserved vasculature. - IVF per above  - Holding nephrotoxic meds  - Trend BMETs   Creatinine, Ser  Date Value Ref Range Status  01/12/2014 3.60* 0.50 - 1.35 mg/dL Final  01/12/2014 3.52* 0.50 - 1.35 mg/dL Final  01/12/2014 3.60* 0.50 - 1.35 mg/dL Final  01/12/2014 3.58* 0.50 - 1.35 mg/dL Final  01/12/2014 3.56* 0.50 - 1.35 mg/dL Final  01/11/2014 3.64* 0.50 - 1.35 mg/dL Final  01/11/2014 3.67* 0.50 - 1.35 mg/dL Final  01/11/2014 3.59* 0.50 - 1.35 mg/dL Final  01/11/2014 3.57* 0.50 - 1.35 mg/dL Final  01/11/2014 3.59* 0.50 - 1.35 mg/dL Final    #Hyperbilirubinemia - Bili labs below, slowly trending down. Mostly direct bilirubinemia. This is likely secondary to hypoperfusion from hypovolemia, but intrahepatic process could also be responsible. Hepatitis panel negative. HIV NR. RUQ [place] with no obvious obstruction, but may need additional imaging such as MRCP vs ERCP if no resolution. - Continue to monitor  Total Bilirubin  Date Value Ref Range Status  01/12/2014 3.6* 0.3 - 1.2 mg/dL Final  01/11/2014 5.2* 0.3 - 1.2 mg/dL Final  01/10/2014 6.4* 0.3 - 1.2 mg/dL Final  01/09/2014 6.3* 0.3 -  1.2 mg/dL Final  01/08/2014 7.3* 0.3 - 1.2 mg/dL Final   Bilirubin     Component Value Date/Time   BILITOT 3.6* 01/12/2014 0300   BILIDIR 2.4* 01/12/2014 0300   IBILI 1.2* 01/12/2014 0300    #Elevated anion gap, resolved - The patient presents with an AG = 22, likely secondary to uremia. Lactate wnl again today.  - IVF per above  - Trend BMET    #Alcohol use - The patient drinks 1-3 craft beers/day for at least 5 days per week. His last drink was on St. Patrick's Day (March 17). Withdrawal unlikely at this point, but we will continue to monitor given confusion. Yesterday CIWAs 1. - CIWA q6h with thiamine and folate  #Prophy - Heparin subq, SCDs  Dispo: Disposition is deferred at this time, awaiting improvement of current medical problems.  Anticipated discharge in approximately 2-3 day(s).   The patient does have a current  PCP (Dionne Volanda Napoleon, MD) and does not need an [facility] hospital follow-up appointment after discharge.  The patient does not have transportation limitations that hinder transportation to clinic appointments.  .Services Needed at time of discharge: Y = Yes, Blank = No PT:   OT:   RN:   Equipment:   Other:      LOS: 5 days   Lesly Dukes, MD 01/12/2014, 1:46 PM  Lesly Dukes, MD  [email or web address] Pager # [phone] Office # [phone]

## 2014-01-12 NOTE — Progress Notes (Signed)
  Date: 01/12/2014  Patient name: Kathaleen MaserCarl Meyering  Medical record number: [id number]  Date of birth: [birth year]   This patient has been seen and the plan of care was discussed with the house staff. Please see their note for complete details. I concur with their findings with the following additions/corrections: His Na has been a struggle to decrease given NGT losses and UOP. At this point, the improvement in hypernatremia has been slow, but the latest value of 148 indicates we are headed in the right direction. I would continue D5W at current rate of 250 cc/hr.  AKI is likely due to pre-renal and progression to ATN. Cr has reached a plateau. He is not oliguric.  Agree with need to start TPN, will need to monitor electrolytes carefully, Mg Phos K. His decreasing H/H likely result of dilution and blood draws. Will need to monitor. He has a degree of chronic anemia at baseline. No active bleeding noted. Noted increased AST, given AKI would check CPK. Suspect underlying chronic liver disease regardless (EtOH?).  Jonah BlueAlejandro Hedaya Latendresse, DO, FACP [facility] 01/12/2014, 2:09 PM

## 2014-01-12 NOTE — Progress Notes (Signed)
Patient ID: Lonnie Henry, male   DOB: 10-09-1951, 63 y.o.   MRN: [id number]  Subjective: Sitting up in a chair.  No n/v.  610m dark bilious output out.  Passing flatus.    Objective:  Vital signs:  Filed Vitals:   01/11/14 2145 01/11/14 2353 01/12/14 0348 01/12/14 0800  BP: 108/68 107/68 110/65 98/59  Pulse: 84 77 83   Temp: 97.8 F (36.6 C) 98 F (36.7 C) 98.1 F (36.7 C)   TempSrc: Oral Oral Oral   Resp: _0 Height:      Weight:   201 lb 4.5 oz (91.3 kg)   SpO2: 98% 100% 100%     Last BM Date: 01/06/14  Intake/Output   Yesterday:  03/29 0701 - 03/30 0700 In: 5743.7 [I.V.:5583.7; NG/GT:60; IV Piggyback:100] Out: 2757.5 [Urine:1900; Emesis/NG output:600; Drains:257.5] This shift:    I/O last 3 completed shifts: In: 7183.7 [I.V.:6933.7; NG/GT:150; IV Piggyback:100] Out: 3875 [Urine:2825; Emesis/NG output:700; Drains:350] Total I/O In: -  Out: 260 [Urine:250; Drains:10]  Physical Exam: General: Pt awake/alert/oriented x3 in no acute distress Abdomen: Soft.  Nondistended. Appropriately tender.  Midline incision, staples are intact, edges are approximated, min serous drainage at umbilicus, dressing applied.  rlq drain with serosanguinous output.    No evidence of peritonitis.  No incarcerated hernias. Ext:  SCDs BLE.  No mjr edema.  No cyanosis Skin: No petechiae / purpura   Problem List:   Principal Problem:   Small bowel obstruction Active Problems:   Acute kidney injury   High anion gap metabolic acidosis   Hyponatremia   Hyperbilirubinemia    Results:   Labs: Results for orders placed during the hospital encounter of 01/07/14 (from the past 48 hour(s))  GLUCOSE, CAPILLARY     Status: Abnormal   Collection Time    01/10/14 11:47 AM      Result Value Ref Range   Glucose-Capillary 114 (*) 70 - 99 mg/dL  GLUCOSE, CAPILLARY     Status: Abnormal   Collection Time    01/10/14  6:48 PM      Result Value Ref Range   Glucose-Capillary 125 (*) 70 - 99  mg/dL   Comment 1 Notify RN     Comment 2 Documented in Chart    BASIC METABOLIC PANEL     Status: Abnormal   Collection Time    01/10/14  6:50 PM      Result Value Ref Range   Sodium 161 (*) 137 - 147 mEq/L   Potassium 4.8  3.7 - 5.3 mEq/L   Chloride 121 (*) 96 - 112 mEq/L   CO2 27  19 - 32 mEq/L   Glucose, Bld 144 (*) 70 - 99 mg/dL   BUN 49 (*) 6 - 23 mg/dL   Creatinine, Ser 2.63 (*) 0.50 - 1.35 mg/dL   Calcium 9.1  8.4 - 10.5 mg/dL   GFR calc non Af Amer 25 (*) >90 mL/min   GFR calc Af Amer 29 (*) >90 mL/min   Comment: (NOTE)     The eGFR has been calculated using the CKD EPI equation.     This calculation has not been validated in all clinical situations.     eGFR's persistently <90 mL/min signify possible Chronic Kidney     Disease.  BASIC METABOLIC PANEL     Status: Abnormal   Collection Time    01/10/14  9:28 PM      Result Value Ref Range   Sodium 157 (*)  137 - 147 mEq/L   Potassium 4.6  3.7 - 5.3 mEq/L   Chloride 120 (*) 96 - 112 mEq/L   CO2 26  19 - 32 mEq/L   Glucose, Bld 151 (*) 70 - 99 mg/dL   BUN 47 (*) 6 - 23 mg/dL   Creatinine, Ser 2.76 (*) 0.50 - 1.35 mg/dL   Calcium 9.2  8.4 - 10.5 mg/dL   GFR calc non Af Amer 23 (*) >90 mL/min   GFR calc Af Amer 27 (*) >90 mL/min   Comment: (NOTE)     The eGFR has been calculated using the CKD EPI equation.     This calculation has not been validated in all clinical situations.     eGFR's persistently <90 mL/min signify possible Chronic Kidney     Disease.  GLUCOSE, CAPILLARY     Status: Abnormal   Collection Time    01/10/14 11:42 PM      Result Value Ref Range   Glucose-Capillary 135 (*) 70 - 99 mg/dL   Comment 1 Notify RN    BASIC METABOLIC PANEL     Status: Abnormal   Collection Time    01/11/14  1:20 AM      Result Value Ref Range   Sodium 159 (*) 137 - 147 mEq/L   Potassium 4.9  3.7 - 5.3 mEq/L   Chloride 120 (*) 96 - 112 mEq/L   CO2 26  19 - 32 mEq/L   Glucose, Bld 130 (*) 70 - 99 mg/dL   BUN 49 (*) 6  - 23 mg/dL   Creatinine, Ser 3.05 (*) 0.50 - 1.35 mg/dL   Calcium 8.8  8.4 - 10.5 mg/dL   GFR calc non Af Amer 21 (*) >90 mL/min   GFR calc Af Amer 24 (*) >90 mL/min   Comment: (NOTE)     The eGFR has been calculated using the CKD EPI equation.     This calculation has not been validated in all clinical situations.     eGFR's persistently <90 mL/min signify possible Chronic Kidney     Disease.  CBC     Status: Abnormal   Collection Time    01/11/14  6:00 AM      Result Value Ref Range   WBC 11.0 (*) 4.0 - 10.5 K/uL   RBC 2.49 (*) 4.22 - 5.81 MIL/uL   Hemoglobin 8.4 (*) 13.0 - 17.0 g/dL   HCT 24.3 (*) 39.0 - 52.0 %   MCV 97.6  78.0 - 100.0 fL   MCH 33.7  26.0 - 34.0 pg   MCHC 34.6  30.0 - 36.0 g/dL   RDW 13.9  11.5 - 15.5 %   Platelets 330  150 - 400 K/uL   Comment: DELTA CHECK NOTED     REPEATED TO VERIFY  HEPATIC FUNCTION PANEL     Status: Abnormal   Collection Time    01/11/14  6:00 AM      Result Value Ref Range   Total Protein 6.9  6.0 - 8.3 g/dL   Albumin 2.0 (*) 3.5 - 5.2 g/dL   AST 65 (*) 0 - 37 U/L   ALT 54 (*) 0 - 53 U/L   Alkaline Phosphatase 61  39 - 117 U/L   Total Bilirubin 5.2 (*) 0.3 - 1.2 mg/dL   Bilirubin, Direct 3.9 (*) 0.0 - 0.3 mg/dL   Indirect Bilirubin 1.3 (*) 0.3 - 0.9 mg/dL  LACTIC ACID, PLASMA     Status: None  Collection Time    01/11/14  6:00 AM      Result Value Ref Range   Lactic Acid, Venous 1.3  0.5 - 2.2 mmol/L  BASIC METABOLIC PANEL     Status: Abnormal   Collection Time    01/11/14  6:00 AM      Result Value Ref Range   Sodium 158 (*) 137 - 147 mEq/L   Potassium 4.9  3.7 - 5.3 mEq/L   Chloride 120 (*) 96 - 112 mEq/L   CO2 27  19 - 32 mEq/L   Glucose, Bld 144 (*) 70 - 99 mg/dL   BUN 53 (*) 6 - 23 mg/dL   Creatinine, Ser 3.23 (*) 0.50 - 1.35 mg/dL   Calcium 9.0  8.4 - 10.5 mg/dL   GFR calc non Af Amer 19 (*) >90 mL/min   GFR calc Af Amer 22 (*) >90 mL/min   Comment: (NOTE)     The eGFR has been calculated using the CKD EPI  equation.     This calculation has not been validated in all clinical situations.     eGFR's persistently <90 mL/min signify possible Chronic Kidney     Disease.  GLUCOSE, CAPILLARY     Status: Abnormal   Collection Time    01/11/14  6:22 AM      Result Value Ref Range   Glucose-Capillary 129 (*) 70 - 99 mg/dL   Comment 1 Notify RN    BASIC METABOLIC PANEL     Status: Abnormal   Collection Time    01/11/14  9:50 AM      Result Value Ref Range   Sodium 158 (*) 137 - 147 mEq/L   Potassium 4.9  3.7 - 5.3 mEq/L   Chloride 119 (*) 96 - 112 mEq/L   CO2 28  19 - 32 mEq/L   Glucose, Bld 112 (*) 70 - 99 mg/dL   BUN 55 (*) 6 - 23 mg/dL   Creatinine, Ser 3.38 (*) 0.50 - 1.35 mg/dL   Calcium 9.0  8.4 - 10.5 mg/dL   GFR calc non Af Amer 18 (*) >90 mL/min   GFR calc Af Amer 21 (*) >90 mL/min   Comment: (NOTE)     The eGFR has been calculated using the CKD EPI equation.     This calculation has not been validated in all clinical situations.     eGFR's persistently <90 mL/min signify possible Chronic Kidney     Disease.  GLUCOSE, CAPILLARY     Status: Abnormal   Collection Time    01/11/14 12:11 PM      Result Value Ref Range   Glucose-Capillary 111 (*) 70 - 99 mg/dL  BASIC METABOLIC PANEL     Status: Abnormal   Collection Time    01/11/14 12:27 PM      Result Value Ref Range   Sodium 156 (*) 137 - 147 mEq/L   Potassium 4.7  3.7 - 5.3 mEq/L   Chloride 118 (*) 96 - 112 mEq/L   CO2 27  19 - 32 mEq/L   Glucose, Bld 122 (*) 70 - 99 mg/dL   BUN 56 (*) 6 - 23 mg/dL   Creatinine, Ser 3.59 (*) 0.50 - 1.35 mg/dL   Calcium 9.1  8.4 - 10.5 mg/dL   GFR calc non Af Amer 17 (*) >90 mL/min   GFR calc Af Amer 20 (*) >90 mL/min   Comment: (NOTE)     The eGFR has been calculated using the  CKD EPI equation.     This calculation has not been validated in all clinical situations.     eGFR's persistently <90 mL/min signify possible Chronic Kidney     Disease.  BASIC METABOLIC PANEL     Status:  Abnormal   Collection Time    01/11/14  4:19 PM      Result Value Ref Range   Sodium 157 (*) 137 - 147 mEq/L   Potassium 4.7  3.7 - 5.3 mEq/L   Chloride 118 (*) 96 - 112 mEq/L   CO2 25  19 - 32 mEq/L   Glucose, Bld 149 (*) 70 - 99 mg/dL   BUN 56 (*) 6 - 23 mg/dL   Creatinine, Ser 3.57 (*) 0.50 - 1.35 mg/dL   Calcium 8.9  8.4 - 10.5 mg/dL   GFR calc non Af Amer 17 (*) >90 mL/min   GFR calc Af Amer 20 (*) >90 mL/min   Comment: (NOTE)     The eGFR has been calculated using the CKD EPI equation.     This calculation has not been validated in all clinical situations.     eGFR's persistently <90 mL/min signify possible Chronic Kidney     Disease.  GLUCOSE, CAPILLARY     Status: Abnormal   Collection Time    01/11/14  5:21 PM      Result Value Ref Range   Glucose-Capillary 133 (*) 70 - 99 mg/dL   Comment 1 Documented in Chart     Comment 2 Notify RN    BASIC METABOLIC PANEL     Status: Abnormal   Collection Time    01/11/14  7:40 PM      Result Value Ref Range   Sodium 154 (*) 137 - 147 mEq/L   Potassium 4.5  3.7 - 5.3 mEq/L   Chloride 116 (*) 96 - 112 mEq/L   CO2 27  19 - 32 mEq/L   Glucose, Bld 146 (*) 70 - 99 mg/dL   BUN 55 (*) 6 - 23 mg/dL   Creatinine, Ser 3.59 (*) 0.50 - 1.35 mg/dL   Calcium 8.9  8.4 - 10.5 mg/dL   GFR calc non Af Amer 17 (*) >90 mL/min   GFR calc Af Amer 20 (*) >90 mL/min   Comment: (NOTE)     The eGFR has been calculated using the CKD EPI equation.     This calculation has not been validated in all clinical situations.     eGFR's persistently <90 mL/min signify possible Chronic Kidney     Disease.  BASIC METABOLIC PANEL     Status: Abnormal   Collection Time    01/11/14  9:01 PM      Result Value Ref Range   Sodium 154 (*) 137 - 147 mEq/L   Potassium 4.6  3.7 - 5.3 mEq/L   Chloride 114 (*) 96 - 112 mEq/L   CO2 27  19 - 32 mEq/L   Glucose, Bld 146 (*) 70 - 99 mg/dL   BUN 53 (*) 6 - 23 mg/dL   Creatinine, Ser 3.67 (*) 0.50 - 1.35 mg/dL   Calcium  8.7  8.4 - 10.5 mg/dL   GFR calc non Af Amer 16 (*) >90 mL/min   GFR calc Af Amer 19 (*) >90 mL/min   Comment: (NOTE)     The eGFR has been calculated using the CKD EPI equation.     This calculation has not been validated in all clinical situations.  eGFR's persistently <90 mL/min signify possible Chronic Kidney     Disease.  BASIC METABOLIC PANEL     Status: Abnormal   Collection Time    01/11/14 11:01 PM      Result Value Ref Range   Sodium 153 (*) 137 - 147 mEq/L   Potassium 4.4  3.7 - 5.3 mEq/L   Chloride 113 (*) 96 - 112 mEq/L   CO2 27  19 - 32 mEq/L   Glucose, Bld 153 (*) 70 - 99 mg/dL   BUN 51 (*) 6 - 23 mg/dL   Creatinine, Ser 3.64 (*) 0.50 - 1.35 mg/dL   Calcium 8.6  8.4 - 10.5 mg/dL   GFR calc non Af Amer 17 (*) >90 mL/min   GFR calc Af Amer 19 (*) >90 mL/min   Comment: (NOTE)     The eGFR has been calculated using the CKD EPI equation.     This calculation has not been validated in all clinical situations.     eGFR's persistently <90 mL/min signify possible Chronic Kidney     Disease.  GLUCOSE, CAPILLARY     Status: Abnormal   Collection Time    01/11/14 11:50 PM      Result Value Ref Range   Glucose-Capillary 148 (*) 70 - 99 mg/dL   Comment 1 Notify RN    BASIC METABOLIC PANEL     Status: Abnormal   Collection Time    01/12/14  1:01 AM      Result Value Ref Range   Sodium 152 (*) 137 - 147 mEq/L   Potassium 4.3  3.7 - 5.3 mEq/L   Chloride 112  96 - 112 mEq/L   CO2 27  19 - 32 mEq/L   Glucose, Bld 154 (*) 70 - 99 mg/dL   BUN 51 (*) 6 - 23 mg/dL   Creatinine, Ser 3.56 (*) 0.50 - 1.35 mg/dL   Calcium 8.6  8.4 - 10.5 mg/dL   GFR calc non Af Amer 17 (*) >90 mL/min   GFR calc Af Amer 20 (*) >90 mL/min   Comment: (NOTE)     The eGFR has been calculated using the CKD EPI equation.     This calculation has not been validated in all clinical situations.     eGFR's persistently <90 mL/min signify possible Chronic Kidney     Disease.  LACTIC ACID, PLASMA      Status: None   Collection Time    01/12/14  3:00 AM      Result Value Ref Range   Lactic Acid, Venous 1.2  0.5 - 2.2 mmol/L  HEPATIC FUNCTION PANEL     Status: Abnormal   Collection Time    01/12/14  3:00 AM      Result Value Ref Range   Total Protein 7.1  6.0 - 8.3 g/dL   Albumin 2.0 (*) 3.5 - 5.2 g/dL   AST 130 (*) 0 - 37 U/L   ALT 76 (*) 0 - 53 U/L   Alkaline Phosphatase 59  39 - 117 U/L   Total Bilirubin 3.6 (*) 0.3 - 1.2 mg/dL   Bilirubin, Direct 2.4 (*) 0.0 - 0.3 mg/dL   Indirect Bilirubin 1.2 (*) 0.3 - 0.9 mg/dL  CBC     Status: Abnormal   Collection Time    01/12/14  3:00 AM      Result Value Ref Range   WBC 8.5  4.0 - 10.5 K/uL   RBC 2.32 (*) 4.22 - 5.81 MIL/uL  Hemoglobin 7.9 (*) 13.0 - 17.0 g/dL   HCT 23.1 (*) 39.0 - 52.0 %   MCV 99.6  78.0 - 100.0 fL   MCH 34.1 (*) 26.0 - 34.0 pg   MCHC 34.2  30.0 - 36.0 g/dL   RDW 13.9  11.5 - 15.5 %   Platelets 286  150 - 400 K/uL  BASIC METABOLIC PANEL     Status: Abnormal   Collection Time    01/12/14  3:01 AM      Result Value Ref Range   Sodium 150 (*) 137 - 147 mEq/L   Potassium 4.8  3.7 - 5.3 mEq/L   Chloride 110  96 - 112 mEq/L   CO2 27  19 - 32 mEq/L   Glucose, Bld 164 (*) 70 - 99 mg/dL   BUN 49 (*) 6 - 23 mg/dL   Creatinine, Ser 3.58 (*) 0.50 - 1.35 mg/dL   Calcium 8.1 (*) 8.4 - 10.5 mg/dL   GFR calc non Af Amer 17 (*) >90 mL/min   GFR calc Af Amer 20 (*) >90 mL/min   Comment: (NOTE)     The eGFR has been calculated using the CKD EPI equation.     This calculation has not been validated in all clinical situations.     eGFR's persistently <90 mL/min signify possible Chronic Kidney     Disease.  BASIC METABOLIC PANEL     Status: Abnormal   Collection Time    01/12/14  5:01 AM      Result Value Ref Range   Sodium 152 (*) 137 - 147 mEq/L   Potassium 4.4  3.7 - 5.3 mEq/L   Chloride 112  96 - 112 mEq/L   CO2 28  19 - 32 mEq/L   Glucose, Bld 164 (*) 70 - 99 mg/dL   BUN 49 (*) 6 - 23 mg/dL   Creatinine, Ser  3.60 (*) 0.50 - 1.35 mg/dL   Calcium 8.4  8.4 - 10.5 mg/dL   GFR calc non Af Amer 17 (*) >90 mL/min   GFR calc Af Amer 20 (*) >90 mL/min   Comment: (NOTE)     The eGFR has been calculated using the CKD EPI equation.     This calculation has not been validated in all clinical situations.     eGFR's persistently <90 mL/min signify possible Chronic Kidney     Disease.  GLUCOSE, CAPILLARY     Status: Abnormal   Collection Time    01/12/14  6:14 AM      Result Value Ref Range   Glucose-Capillary 141 (*) 70 - 99 mg/dL   Comment 1 Notify RN    BASIC METABOLIC PANEL     Status: Abnormal   Collection Time    01/12/14  6:48 AM      Result Value Ref Range   Sodium 149 (*) 137 - 147 mEq/L   Potassium 4.4  3.7 - 5.3 mEq/L   Chloride 110  96 - 112 mEq/L   CO2 26  19 - 32 mEq/L   Glucose, Bld 156 (*) 70 - 99 mg/dL   BUN 49 (*) 6 - 23 mg/dL   Creatinine, Ser 3.52 (*) 0.50 - 1.35 mg/dL   Calcium 8.6  8.4 - 10.5 mg/dL   GFR calc non Af Amer 17 (*) >90 mL/min   GFR calc Af Amer 20 (*) >90 mL/min   Comment: (NOTE)     The eGFR has been calculated using the CKD EPI equation.  This calculation has not been validated in all clinical situations.     eGFR's persistently <90 mL/min signify possible Chronic Kidney     Disease.    Imaging / Studies: Dg Chest Port 1 View  01/11/2014   CLINICAL DATA:  Central line placement  EXAM: PORTABLE CHEST - 1 VIEW  COMPARISON:  Prior acute abdominal series and chest x-ray 01/07/2014  FINDINGS: Interval placement of a right IJ approach central venous catheter. The catheter tip projects over the mid SVC. A nasogastric tube is been placed, the tip lies off the field of view but below the diaphragm and likely within the stomach or proximal small bowel. Increasing right lower lobe opacity favored to reflect progressive atelectasis. Cardiac and mediastinal contours remain unchanged. No evidence of pneumothorax. No acute osseous abnormality.  IMPRESSION: 1. The tip of the  new right IJ central venous catheter projects over the mid SVC. No evidence of complicating pneumothorax. 2. A nasogastric tube is present. The tip lies below the diaphragm off the field of view presumably within the stomach or proximal small bowel. 3. Increasing right basilar opacity favored to reflect atelectasis. Developing infiltrate difficult to exclude.   Electronically Signed   By: Jacqulynn Cadet M.D.   On: 01/11/2014 21:35    Medications / Allergies: per chart  Antibiotics: Anti-infectives   Start     Dose/Rate Route Frequency Ordered Stop   01/09/14 1315  piperacillin-tazobactam (ZOSYN) IVPB 3.375 g     3.375 g 100 mL/hr over 30 Minutes Intravenous STAT 01/09/14 1300 01/09/14 1733   01/08/14 2300  piperacillin-tazobactam (ZOSYN) IVPB 3.375 g     3.375 g 12.5 mL/hr over 240 Minutes Intravenous Every 8 hours 01/08/14 1447     01/08/14 1500  piperacillin-tazobactam (ZOSYN) IVPB 3.375 g     3.375 g 100 mL/hr over 30 Minutes Intravenous  Once 01/08/14 1447 01/08/14 1540      Assessment/Plan Exploratory laparotomy, LOA, appendectomy and drainage of abscess([facility] 01/09/14) POD#3 NGT, NPO x ice chips, await bowel function Mobilize IS Pain control Drain care Abdominal binder Recommend DC foley   AKI, hypernatremia per primary team  Erby Pian, [facility] Pager [phone] Office [phone]  01/12/2014 9:37 AM

## 2014-01-12 NOTE — Progress Notes (Signed)
INITIAL NUTRITION ASSESSMENT  DOCUMENTATION CODES Per approved criteria  -Severe malnutrition in the context of acute illness or injury   INTERVENTION: TPN per PharmD. Monitor magnesium, potassium, and phosphorus daily for at least 3 days, MD to replete as needed, as pt is at risk for refeeding syndrome given severe malnutrition. RD to continue to follow nutrition care plan.  NUTRITION DIAGNOSIS: Inadequate oral intake related to inability to eat as evidenced by npo status.   Goal: Intake to meet >90% of estimated nutrition needs.  Monitor:  weight trends, lab trends, I/O's, TPN initiation/tolerance; ability to transition to EN/PO's  Reason for Assessment: New TPN  63 y.o. male  Admitting Dx: Small bowel obstruction  ASSESSMENT: PMHx significant for HTN, prior CVA. Admitted with n/v and abdominal pain. Work-up reveals acute SBO.  NGT placed on admission. Continues with NGT at this time, 600 ml output yesterday.  Pt with no improvement with conservative measures from 3/25 - 3/27. Pt taken to OR, underwent exp lap, lysis of adhesions, appendectomy (perforated appendix) and drainage of intraabdominal abscess on 3/27.  Pt with elevated LFT's - team suspects underlying chronic liver disease (?cirrhosis)  Patient with elevated sodium at 149 and getting D5 at 250 ml/hr. TPN will be started at low rate 2/2 D5. Patient to receive TPN with Clinimix E 5/15 @ 3- ml/hr. This, with current D5 infusion will provide 1991 kcal, and 36 grams protein per day. Meets 91% minimum estimated energy needs and 34% minimum estimated protein needs. (Will not be able to meet protein goals with D5 rate so high.)  Patient reports usual body weight as of [place] Patrick[place] Day was 215 - 220 lb. Pt is usually very active at baseline, eats well and does cardio/strength training on a regular basis. Last time pt ate, and was able to tolerate nutrition, was 3/19 at [facility], had a few bites of a hamburger.  Pt  with moderate wasting in temporal region.  Pt meets criteria for severe MALNUTRITION in the context of acute illness as evidenced by moderate muscle wasting, 8% wt loss x <1 month, and intake of <50% x at least 5 days.  Potassium WNL. Magnesium WNL. Phosphorus, triglycerides, prealbumin pending.  Height: Ht Readings from Last 1 Encounters:  01/07/14 6' 2"  (1.88 m)    Weight: Wt Readings from Last 1 Encounters:  01/12/14 201 lb 4.5 oz (91.3 kg)    Ideal Body Weight: 190 lb/86.4 kg  % Ideal Body Weight: 106%  Wt Readings from Last 10 Encounters:  01/12/14 201 lb 4.5 oz (91.3 kg)  01/12/14 201 lb 4.5 oz (91.3 kg)    Usual Body Weight: 215-220 lb  % Usual Body Weight: 92%  BMI:  Body mass index is 25.83 kg/(m^2). Overweight  Estimated Nutritional Needs: Kcal: 2200 - 2400 Protein: 105 - 120 g Fluid: 2.2 - 2.4 liters  Skin: closed abdominal incision  Diet Order: NPO  EDUCATION NEEDS: -No education needs identified at this time   Intake/Output Summary (Last 24 hours) at 01/12/14 1004 Last data filed at 01/12/14 0800  Gross per 24 hour  Intake 5693.67 ml  Output 2622.5 ml  Net 3071.17 ml    Last BM: 3/24  Labs:   Recent Labs Lab 01/09/14 0532 01/10/14 0605  01/12/14 0301 01/12/14 0501 01/12/14 0648  NA 150* 159*  < > 150* 152* 149*  K 4.2 5.2  < > 4.8 4.4 4.4  CL 112 119*  < > 110 112 110  CO2 24 26  < >  27 28 26   BUN 61* 44*  < > 49* 49* 49*  CREATININE 2.34* 2.23*  < > 3.58* 3.60* 3.52*  CALCIUM 9.2 9.1  < > 8.1* 8.4 8.6  MG  --  2.3  --   --   --   --   GLUCOSE 106* 132*  < > 164* 164* 156*  < > = values in this interval not displayed.  CBG (last 3)   Recent Labs  01/11/14 1721 01/11/14 2350 01/12/14 0614  GLUCAP 133* 148* 141*   No results found for this basename: trig   No results found for this basename: prealbumin    Scheduled Meds: . antiseptic oral rinse  15 mL Mouth Rinse q12n4p  . chlorhexidine  15 mL Mouth Rinse BID  .  Chlorhexidine Gluconate Cloth  6 each Topical Daily  . folic acid  1 mg Oral Daily  . heparin  5,000 Units Subcutaneous 3 times per day  . HYDROmorphone PCA 0.3 mg/mL   Intravenous 6 times per day  . [START ON 01/13/2014] insulin aspart  0-9 Units Subcutaneous 4 times per day  . mupirocin ointment  1 application Nasal BID  . piperacillin-tazobactam (ZOSYN)  IV  3.375 g Intravenous Q8H  . thiamine  100 mg Oral Daily   Or  . thiamine  100 mg Intravenous Daily    Continuous Infusions: . dextrose 250 mL (01/12/14 0846)  . Marland KitchenTPN (CLINIMIX-E) Adult     And  . fat emulsion      Past Medical History  Diagnosis Date  . Hypertension     Dr. Sherrie [facility] (PCP)  . Hypercholesterolemia   . Stroke 2011    at [facility] after sex  . SBO (small bowel obstruction) 01/07/2014    Past Surgical History  Procedure Laterality Date  . Rotator cuff repair Right 11/19/13  . Arthroscopic knee Left   . Bone marrow biopsy    . Colonoscopy      Benign findings per pt  . Colonoscopy      Benign findings per pt    Inda Coke MS, RD, LDN Inpatient Registered Dietitian Pager: [phone] After-hours pager: [phone]

## 2014-01-13 ENCOUNTER — Encounter (HOSPITAL_COMMUNITY): Payer: Self-pay | Admitting: General Surgery

## 2014-01-13 LAB — BASIC METABOLIC PANEL
BUN: 48 mg/dL — AB (ref 6–23)
BUN: 51 mg/dL — AB (ref 6–23)
BUN: 55 mg/dL — ABNORMAL HIGH (ref 6–23)
CHLORIDE: 102 meq/L (ref 96–112)
CO2: 25 meq/L (ref 19–32)
CO2: 23 mEq/L (ref 19–32)
CO2: 24 meq/L (ref 19–32)
CREATININE: 3.5 mg/dL — AB (ref 0.50–1.35)
CREATININE: 3.34 mg/dL — AB (ref 0.50–1.35)
Calcium: 7.9 mg/dL — ABNORMAL LOW (ref 8.4–10.5)
Calcium: 8.6 mg/dL (ref 8.4–10.5)
Calcium: 8.5 mg/dL (ref 8.4–10.5)
Chloride: 103 mEq/L (ref 96–112)
Chloride: 101 mEq/L (ref 96–112)
Creatinine, Ser: 3.31 mg/dL — ABNORMAL HIGH (ref 0.50–1.35)
GFR calc Af Amer: 20 mL/min — ABNORMAL LOW (ref >90)
GFR calc Af Amer: 21 mL/min — ABNORMAL LOW (ref >90)
GFR calc Af Amer: 22 mL/min — ABNORMAL LOW (ref >90)
GFR calc non Af Amer: 17 mL/min — ABNORMAL LOW (ref >90)
GFR calc non Af Amer: 18 mL/min — ABNORMAL LOW (ref >90)
GFR, EST NON AFRICAN AMERICAN: 19 mL/min — AB (ref >90)
GLUCOSE: 142 mg/dL — AB (ref 70–99)
GLUCOSE: 160 mg/dL — AB (ref 70–99)
Glucose, Bld: 139 mg/dL — ABNORMAL HIGH (ref 70–99)
Potassium: 3.9 mEq/L (ref 3.7–5.3)
Potassium: 3.8 mEq/L (ref 3.7–5.3)
Potassium: 3.6 mEq/L — ABNORMAL LOW (ref 3.7–5.3)
SODIUM: 141 meq/L (ref 137–147)
Sodium: 143 mEq/L (ref 137–147)
Sodium: 142 mEq/L (ref 137–147)

## 2014-01-13 LAB — COMPREHENSIVE METABOLIC PANEL
ALK PHOS: 57 U/L (ref 39–117)
ALT: 81 U/L — ABNORMAL HIGH (ref 0–53)
AST: 140 U/L — ABNORMAL HIGH (ref 0–37)
Albumin: 1.9 g/dL — ABNORMAL LOW (ref 3.5–5.2)
BUN: 50 mg/dL — AB (ref 6–23)
CALCIUM: 8.4 mg/dL (ref 8.4–10.5)
CO2: 25 mEq/L (ref 19–32)
CREATININE: 3.43 mg/dL — AB (ref 0.50–1.35)
Chloride: 103 mEq/L (ref 96–112)
GFR calc non Af Amer: 18 mL/min — ABNORMAL LOW (ref >90)
GFR, EST AFRICAN AMERICAN: 21 mL/min — AB (ref >90)
GLUCOSE: 141 mg/dL — AB (ref 70–99)
Potassium: 3.8 mEq/L (ref 3.7–5.3)
Sodium: 143 mEq/L (ref 137–147)
TOTAL PROTEIN: 6.9 g/dL (ref 6.0–8.3)
Total Bilirubin: 2.7 mg/dL — ABNORMAL HIGH (ref 0.3–1.2)

## 2014-01-13 LAB — GLUCOSE, CAPILLARY
GLUCOSE-CAPILLARY: 137 mg/dL — AB (ref 70–99)
GLUCOSE-CAPILLARY: 143 mg/dL — AB (ref 70–99)
GLUCOSE-CAPILLARY: 145 mg/dL — AB (ref 70–99)
Glucose-Capillary: 142 mg/dL — ABNORMAL HIGH (ref 70–99)

## 2014-01-13 LAB — CBC
HCT: 20.6 % — ABNORMAL LOW (ref 39.0–52.0)
Hemoglobin: 7.4 g/dL — ABNORMAL LOW (ref 13.0–17.0)
MCH: 34.6 pg — ABNORMAL HIGH (ref 26.0–34.0)
MCHC: 35.9 g/dL (ref 30.0–36.0)
MCV: 96.3 fL (ref 78.0–100.0)
PLATELETS: 268 10*3/uL (ref 150–400)
RBC: 2.14 MIL/uL — ABNORMAL LOW (ref 4.22–5.81)
RDW: 13.6 % (ref 11.5–15.5)
WBC: 9.5 10*3/uL (ref 4.0–10.5)

## 2014-01-13 LAB — CK
CK TOTAL: 2764 U/L — AB (ref 7–232)
Total CK: 2257 U/L — ABNORMAL HIGH (ref 7–232)

## 2014-01-13 LAB — DIFFERENTIAL
BASOS ABS: 0 10*3/uL (ref 0.0–0.1)
Basophils Relative: 0 % (ref 0–1)
Eosinophils Absolute: 0.3 10*3/uL (ref 0.0–0.7)
Eosinophils Relative: 3 % (ref 0–5)
LYMPHS ABS: 1 10*3/uL (ref 0.7–4.0)
Lymphocytes Relative: 11 % — ABNORMAL LOW (ref 12–46)
MONOS PCT: 4 % (ref 3–12)
Monocytes Absolute: 0.4 10*3/uL (ref 0.1–1.0)
NEUTROS PCT: 82 % — AB (ref 43–77)
Neutro Abs: 7.8 10*3/uL — ABNORMAL HIGH (ref 1.7–7.7)

## 2014-01-13 LAB — TRIGLYCERIDES: Triglycerides: 215 mg/dL — ABNORMAL HIGH (ref <150)

## 2014-01-13 LAB — PREALBUMIN: PREALBUMIN: 14.5 mg/dL — AB (ref 17.0–34.0)

## 2014-01-13 LAB — PHOSPHORUS: PHOSPHORUS: 4.1 mg/dL (ref 2.3–4.6)

## 2014-01-13 LAB — MAGNESIUM: MAGNESIUM: 2 mg/dL (ref 1.5–2.5)

## 2014-01-13 MED ORDER — CLINIMIX E/DEXTROSE (5/15) 5 % IV SOLN
INTRAVENOUS | Status: AC
  Administered 2014-01-13: 17:00:00 via INTRAVENOUS
  Filled 2014-01-13: qty 2000

## 2014-01-13 MED ORDER — FAT EMULSION 20 % IV EMUL
250.0000 mL | INTRAVENOUS | Status: AC
  Administered 2014-01-13: 250 mL via INTRAVENOUS
  Filled 2014-01-13: qty 250

## 2014-01-13 MED ORDER — THIAMINE HCL 100 MG/ML IJ SOLN
INTRAVENOUS | Status: DC
  Filled 2014-01-13: qty 2000

## 2014-01-13 MED ORDER — ACETAMINOPHEN 10 MG/ML IV SOLN
1000.0000 mg | Freq: Four times a day (QID) | INTRAVENOUS | Status: AC
  Administered 2014-01-13 – 2014-01-14 (×4): 1000 mg via INTRAVENOUS
  Filled 2014-01-13 (×4): qty 100

## 2014-01-13 MED ORDER — FAT EMULSION 20 % IV EMUL
250.0000 mL | INTRAVENOUS | Status: DC
  Filled 2014-01-13 (×2): qty 250

## 2014-01-13 MED ORDER — DEXTROSE 5 % IV SOLN: INTRAVENOUS | Status: DC

## 2014-01-13 NOTE — Progress Notes (Addendum)
Subjective: Patient seen and examined this morning at the bedside. He is sitting in the recliner. He is starting to feel "rumbling" in his bowels, but still no flatus. Pain is improving, but he still looks pretty miserable. He walked with PT today which was a challenge, but he is glad he was able to get out of bed.  Objective: Vital signs in last 24 hours: Filed Vitals:   01/13/14 0500 01/13/14 0544 01/13/14 0700 01/13/14 1100  BP:  108/68    Pulse:  78 84   Temp:  100.1 F (37.8 C) 99.2 F (37.3 C) 98.4 F (36.9 C)  TempSrc:  Oral Oral Oral  Resp:  20 21   Height:      Weight: 201 lb 8 oz (91.4 kg)     SpO2:  97% 98%    Weight change: 3.5 oz (0.1 kg)  Intake/Output Summary (Last 24 hours) at 01/13/14 1308 Last data filed at 01/13/14 1051  Gross per 24 hour  Intake   2849 ml  Output 1842.5 ml  Net 1006.5 ml   General: alert, cooperative, appears uncomfortable HEENT: PERRL, EOMI, oropharynx clear and non-erythematous, NG tube in place, scleral jaundice Neck: supple Lungs: clear to ascultation bilaterally, normal work of respiration, no wheezes, rales, ronchi Heart: regular rate and rhythm, no murmurs, gallops, or rubs Abdomen: Abdominal binder in place. Bowel sounds heard in lower quadrants today. Tenderness is improved, he can tolerate gentle palpation. Surgical incision covered with dressing that is clean, dry, intact. Drain in place. Extremities: no cyanosis, clubbing, or edema Neurologic: alert & oriented X3, no asterixis. Moves all extremities voluntarily  Lab Results: Basic Metabolic Panel:  Recent Labs Lab 01/12/14 1100  01/12/14 1600  01/13/14 0400 01/13/14 1045  NA 150*  < > 147  < > 143 142  K 4.3  < > 4.2  < > 3.8 3.8  CL 110  < > 108  < > 103 102  CO2 25  < > 25  < > 25 23  GLUCOSE 108*  < > 96  < > 141* 160*  BUN 47*  < > 48*  < > 50* 51*  CREATININE 3.60*  < > 3.55*  < > 3.43* 3.34*  CALCIUM 8.4  < > 8.1*  < > 8.4 8.6  MG 2.1  --   --   --  2.0   --   PHOS  --   --  3.6  --  4.1  --   < > = values in this interval not displayed. Liver Function Tests:  Recent Labs Lab 01/12/14 0300 01/13/14 0400  AST 130* 140*  ALT 76* 81*  ALKPHOS 59 57  BILITOT 3.6* 2.7*  PROT 7.1 6.9  ALBUMIN 2.0* 1.9*    Recent Labs Lab 01/06/14 2345  LIPASE 24   CBC:  Recent Labs Lab 01/06/14 2345  01/12/14 0300 01/13/14 0400  WBC 10.7*  < > 8.5 9.5  NEUTROABS 8.4*  --   --  7.8*  HGB 11.6*  < > 7.9* 7.4*  HCT 31.4*  < > 23.1* 20.6*  MCV 91.0  < > 99.6 96.3  PLT 288  < > 286 268  < > = values in this interval not displayed. Cardiac Enzymes:  Recent Labs Lab 01/08/14 1208 01/13/14 0400  CKTOTAL 265* 2764*   CBG:  Recent Labs Lab 01/11/14 2350 01/12/14 0614 01/12/14 1123 01/12/14 1803 01/13/14 0007 01/13/14 0537  GLUCAP 148* 141* 100* 139* 142* 137*  Coagulation:  Recent Labs Lab 01/09/14 0532  LABPROT 18.0*  INR 1.53*   Urinalysis:  Recent Labs Lab 01/07/14 0734  COLORURINE AMBER*  LABSPEC 1.018  PHURINE 5.0  GLUCOSEU NEGATIVE  HGBUR MODERATE*  BILIRUBINUR SMALL*  KETONESUR NEGATIVE  PROTEINUR 30*  UROBILINOGEN 1.0  NITRITE NEGATIVE  LEUKOCYTESUR NEGATIVE    Micro Results: Recent Results (from the past 240 hour(s))  SURGICAL PCR SCREEN     Status: Abnormal   Collection Time    01/09/14  4:48 AM      Result Value Ref Range Status   MRSA, PCR NEGATIVE  NEGATIVE Final   Staphylococcus aureus POSITIVE (*) NEGATIVE Final   Comment:            The Xpert SA Assay (FDA     approved for NASAL specimens     in patients over 48 years of age),     is one component of     a comprehensive surveillance     program.  Test performance has     been validated by [employer] for patients greater     than or equal to 20 year old.     It is not intended     to diagnose infection nor to     guide or monitor treatment.  ANAEROBIC CULTURE     Status: None   Collection Time    01/09/14  1:46 PM       Result Value Ref Range Status   Specimen Description PERITONEAL FLUID   Final   Special Requests FLUID ON SWAB PT ON ZOSYN   Final   Gram Stain     Final   Value: RARE WBC PRESENT, PREDOMINANTLY MONONUCLEAR     NO SQUAMOUS EPITHELIAL CELLS SEEN     RARE GRAM POSITIVE COCCI     IN PAIRS     Performed at [facility]   Culture     Final   Value: NO ANAEROBES ISOLATED; CULTURE IN PROGRESS FOR 5 DAYS     Note: Gram Stain Report Called to,Read Back By and Verified With: DORA GARDNER ON 01/09/2014 AT 11:27P BY WILEJ     Performed at [facility]   Report Status PENDING   Incomplete  BODY FLUID CULTURE     Status: None   Collection Time    01/09/14  1:46 PM      Result Value Ref Range Status   Specimen Description PERITONEAL FLUID   Final   Special Requests FLUID ON SWAB PT ON ZOSYN   Final   Gram Stain     Final   Value: WBC PRESENT, PREDOMINANTLY MONONUCLEAR     GRAM POSITIVE COCCI     IN PAIRS     Performed at [facility]   Culture     Final   Value: RARE ESCHERICHIA COLI     MODERATE STREPTOCOCCUS GROUP C     Note: Gram Stain Report Called to,Read Back By and Verified With: DORA GARDNER ON 01/09/2014 AT 11:27P BY WILEJ     Performed at [facility]   Report Status 01/12/2014 FINAL   Final   Organism ID, Bacteria ESCHERICHIA COLI   Final   Organism ID, Bacteria STREPTOCOCCUS GROUP C   Final   Studies/Results: Dg Chest Port 1 View  01/11/2014   CLINICAL DATA:  Central line placement  EXAM: PORTABLE CHEST - 1 VIEW  COMPARISON:  Prior acute abdominal series and chest x-ray  01/07/2014  FINDINGS: Interval placement of a right IJ approach central venous catheter. The catheter tip projects over the mid SVC. A nasogastric tube is been placed, the tip lies off the field of view but below the diaphragm and likely within the stomach or proximal small bowel. Increasing right lower lobe opacity favored to reflect progressive atelectasis. Cardiac and mediastinal  contours remain unchanged. No evidence of pneumothorax. No acute osseous abnormality.  IMPRESSION: 1. The tip of the new right IJ central venous catheter projects over the mid SVC. No evidence of complicating pneumothorax. 2. A nasogastric tube is present. The tip lies below the diaphragm off the field of view presumably within the stomach or proximal small bowel. 3. Increasing right basilar opacity favored to reflect atelectasis. Developing infiltrate difficult to exclude.   Electronically Signed   By: Jacqulynn Cadet M.D.   On: 01/11/2014 21:35   Medications: I have reviewed the patient's current medications. Scheduled Meds: . acetaminophen  1,000 mg Intravenous 4 times per day  . antiseptic oral rinse  15 mL Mouth Rinse q12n4p  . chlorhexidine  15 mL Mouth Rinse BID  . Chlorhexidine Gluconate Cloth  6 each Topical Daily  . heparin  5,000 Units Subcutaneous 3 times per day  . HYDROmorphone PCA 0.3 mg/mL   Intravenous 6 times per day  . insulin aspart  0-9 Units Subcutaneous 4 times per day  . mupirocin ointment  1 application Nasal BID  . piperacillin-tazobactam (ZOSYN)  IV  3.375 g Intravenous Q8H   Continuous Infusions: . dextrose 130 mL/hr at 01/12/14 2158  . dextrose    . Marland KitchenTPN (CLINIMIX-E) Adult 30 mL/hr at 01/12/14 1714   And  . fat emulsion 240 mL (01/12/14 1714)  . fat emulsion     And  . Marland KitchenTPN (CLINIMIX-E) Adult     PRN Meds:.diphenhydrAMINE, diphenhydrAMINE, morphine injection, naloxone, ondansetron, sodium chloride  Assessment/Plan: The patient is a 63 yo man, presenting with abdominal pain, nausea, and vomiting, found to have a small bowel obstruction and subsequently ruptured appendicitis on ex-lap.  #Acute small bowel obstruction/peritonitis/ruptured appendicitis with intraabdominal abscess - Now POD#4 for exploratory laparotomy with ruptured appendicitis, SBO, lysis of adhesions. Improvement in pain today and bowel sounds heard. Still no flatus. - Appreciate surgery  recs  - Continue NPO, NG tube  - Continue PCA Dilaudid pump, zofran for symptomatic relief  - Add IV Tylenol QID - Continue Zosyn IV - Incentive spirometry - Mobilize OOB, PT eval and treat, OT eval and treat - Continue abdominal binder  #Hypernatremia/hyperchloremia, resolved - Likely iatrogenic from aggressive IVF resuscitation combined with NGT losses and insensible losses from surgery. Steady trend down to normal overnight. We have transitioned fluids to maintenance rate. - D5W at 131m/hr, will consider stopping the D5 tomorrow now that he is tolerating TPN - Monitor BMP BID  Sodium  Date Value Ref Range Status  01/13/2014 142  137 - 147 mEq/L Final  01/13/2014 143  137 - 147 mEq/L Final  01/13/2014 143  137 - 147 mEq/L Final  01/12/2014 145  137 - 147 mEq/L Final  01/12/2014 146  137 - 147 mEq/L Final  01/12/2014 147  137 - 147 mEq/L Final  01/12/2014 148* 137 - 147 mEq/L Final  01/12/2014 150* 137 - 147 mEq/L Final    #Refeeding - Dietitian and pharmacy consulted for TPN given prolonged n.p.o. Status. He has an IJ central line in correct position per PCCM. - Appreciate nutrition and pharmacy recs - TPN per pharmacy -  Will continue CBG monitoring q6h and sensitive SSI coverage - IV Multivitamin, thiamine, folic acid daily in TPN - Trace elements M/W/F due to national shortage  - TPN labs (including monitor magnesium, potassium, and phosphorus daily for at least 3 days)  #Acute on chronic anemia - Likely dilutional plus acute blood loss anemia, to be expected s/p abdominal surgery. Also iatrogenic from serial blood draws.  Patient has a history of macrocytic anemia of unknown origin that was worked up extensively at [facility] (records are in [employer]). Nutritional markers were normal and he even had a bone marrow biopsy that was unremarkable. No gross bleeding. NGT output is bilious. Smear here showed increased bands (>20%), no signs of hemolysis.  - Continue to monitor -  Transfusion threshold Hgb <7  Hemoglobin  Date Value Ref Range Status  01/13/2014 7.4* 13.0 - 17.0 g/dL Final  01/12/2014 7.9* 13.0 - 17.0 g/dL Final  01/11/2014 8.4* 13.0 - 17.0 g/dL Final  01/10/2014 10.0* 13.0 - 17.0 g/dL Final  01/09/2014 8.9* 13.0 - 17.0 g/dL Final    #Acute kidney injury - Presented with a Cr of 8.62, from his last known baseline of 1.2. This likely represented a combination of prerenal azotemia and ATN. Cr downtrended with IVF resuscitation, now stabilized at ~3.5. Suspect continued pre-renal azotemia from insensible losses during surgery/NPO +/- ATN. CK elevated at 2764 (it was 265 on admission), suspect resolving rhabdomyolysis. AST also slightly elevated also which can suggest muscle injury. - IVF per above  - Holding nephrotoxic meds  - Trend BMETs  - Trend CK  Creatinine, Ser  Date Value Ref Range Status  01/13/2014 3.34* 0.50 - 1.35 mg/dL Final  01/13/2014 3.43* 0.50 - 1.35 mg/dL Final  01/13/2014 3.50* 0.50 - 1.35 mg/dL Final  01/12/2014 3.49* 0.50 - 1.35 mg/dL Final  01/12/2014 3.56* 0.50 - 1.35 mg/dL Final  01/12/2014 3.55* 0.50 - 1.35 mg/dL Final  01/12/2014 3.53* 0.50 - 1.35 mg/dL Final  01/12/2014 3.60* 0.50 - 1.35 mg/dL Final  01/12/2014 3.52* 0.50 - 1.35 mg/dL Final  01/12/2014 3.60* 0.50 - 1.35 mg/dL Final    #Hyperbilirubinemia - Bili labs below, slowly trending down. Mostly direct bilirubinemia. This is likely secondary to hypoperfusion from hypovolemia, but intrahepatic process could also be responsible. Hepatitis panel negative. HIV NR. RUQ [place] with no obvious obstruction, but may need additional imaging such as MRCP vs ERCP if no resolution.  - Continue to monitor  Total Bilirubin  Date Value Ref Range Status  01/13/2014 2.7* 0.3 - 1.2 mg/dL Final  01/12/2014 3.6* 0.3 - 1.2 mg/dL Final  01/11/2014 5.2* 0.3 - 1.2 mg/dL Final  01/10/2014 6.4* 0.3 - 1.2 mg/dL Final  01/09/2014 6.3* 0.3 - 1.2 mg/dL Final   Bilirubin     Component Value Date/Time    BILITOT 2.7* 01/13/2014 0400   BILIDIR 2.4* 01/12/2014 0300   IBILI 1.2* 01/12/2014 0300    #Elevated anion gap - AG 18 today, likely secondary to uremia vs. Starvation ketoacidosis. Lactate has been normal but will check again in am. - IVF per above  - Trend BMET  - Repeat lactate in am   #Alcohol use - The patient drinks 1-3 craft beers/day for at least 5 days per week. His last drink was on St. Patrick's Day (March 17). Withdrawal unlikely at this point, but we will continue to monitor given confusion. Yesterday CIWAs 1. - CIWA q6h with thiamine and folate  #Prophy - Heparin subq, SCDs  Dispo: Disposition is deferred at  this time, awaiting improvement of current medical problems.  Anticipated discharge in approximately 2-3 day(s).   The patient does have a current PCP (Dionne Volanda Napoleon, MD) and does not need an [facility] hospital follow-up appointment after discharge.  The patient does not have transportation limitations that hinder transportation to clinic appointments.  .Services Needed at time of discharge: Y = Yes, Blank = No PT: No PT follow up;Supervision - Intermittent  OT:   RN:   Equipment: Rolling walker with 5" wheels   Other:      LOS: 6 days   Lesly Dukes, MD 01/13/2014, 1:08 PM  Lesly Dukes, MD  [email or web address] Pager # [phone] Office # [phone]

## 2014-01-13 NOTE — Progress Notes (Signed)
Needs to ambulate, agree with above

## 2014-01-13 NOTE — Progress Notes (Signed)
  Date: 01/13/2014  Patient name: [name]  Medical record number: [id number]  Date of birth: 1951-05-26   This patient has been seen and the plan of care was discussed with the house staff. Please see their note for complete details. I concur with their findings with the following additions/corrections: Feels better. He was ambulating the hallway with PT this morning. He admits he is hearing his bowels trying to move but has not had a BM or passed gas this morning.  Continue NPO with NGT. TPN started. Monitoring electrolytes including K, Mg, Phos. Watch for refeeding syndrome. Hypernatremia has resolved. I would make sure to keep him on maintenance IVF and closely watching his NG losses, as he may develop hypernatremia again. Renal function is improving. Likely ATN, but given elevated CPK I agree he could have had rhabdo and this may be on the downtrend.  Watch H/H daily. I don't see evidence of active bleed. Likely a combination of chronic anemia, dilutional, and phlebotomy.  Jonah BlueAlejandro Takeshia Wenk, DO, FACP Faculty [facility] 01/13/2014, 1:54 PM

## 2014-01-13 NOTE — Progress Notes (Signed)
PARENTERAL NUTRITION CONSULT NOTE - Follow up   Pharmacy Consult for TPN Indication: Prolonged ileus  No Known Allergies  Patient Measurements: Height: 6\' 2"  (188 cm) Weight: 201 lb 8 oz (91.4 kg) IBW/kg (Calculated) : 82.2   Vital Signs: Temp: 99.2 F (37.3 C) (03/31 0700) Temp src: Oral (03/31 0700) BP: 108/68 mmHg (03/31 0544) Pulse Rate: 84 (03/31 0700) Intake/Output from previous day: 03/30 0701 - 03/31 0700 In: 2330 [I.V.:1790; IV Piggyback:100; TPN:440] Out: 2067.5 [Urine:1925; Emesis/NG output:100; Drains:42.5] Intake/Output from this shift: Total I/O In: 510 [I.V.:390; TPN:120] Out: 150 [Emesis/NG output:150]  Labs:  Recent Labs  01/11/14 0600 01/12/14 0300 01/13/14 0400  WBC 11.0* 8.5 9.5  HGB 8.4* 7.9* 7.4*  HCT 24.3* 23.1* 20.6*  PLT 330 286 268     Recent Labs  01/11/14 0600  01/12/14 0300  01/12/14 1100 01/12/14 1301 01/12/14 1600  01/12/14 2201 01/13/14 0101 01/13/14 0400  NA 158*  < >  --   < > 150* 148* 147  < > 145 143 143  K 4.9  < >  --   < > 4.3 4.3 4.2  < > 4.0 3.9 3.8  CL 120*  < >  --   < > 110 109 108  < > 104 103 103  CO2 27  < >  --   < > 25 26 25   < > 26 25 25   GLUCOSE 144*  < >  --   < > 108* 121* 96  < > 117* 142* 141*  BUN 53*  < >  --   < > 47* 49* 48*  < > 48* 48* 50*  CREATININE 3.23*  < >  --   < > 3.60* 3.53* 3.55*  < > 3.49* 3.50* 3.43*  CALCIUM 9.0  < >  --   < > 8.4 8.5 8.1*  < > 8.3* 7.9* 8.4  MG  --   --   --   --  2.1  --   --   --   --   --  2.0  PHOS  --   --   --   --   --   --  3.6  --   --   --  4.1  PROT 6.9  --  7.1  --   --   --   --   --   --   --  6.9  ALBUMIN 2.0*  --  2.0*  --   --   --   --   --   --   --  1.9*  AST 65*  --  130*  --   --   --   --   --   --   --  140*  ALT 54*  --  76*  --   --   --   --   --   --   --  81*  ALKPHOS 61  --  59  --   --   --   --   --   --   --  57  BILITOT 5.2*  --  3.6*  --   --   --   --   --   --   --  2.7*  BILIDIR 3.9*  --  2.4*  --   --   --   --   --    --   --   --   IBILI 1.3*  --  1.2*  --   --   --   --   --   --   --   --   TRIG  --   --   --   --   --   --   --   --   --   --  215*  < > = values in this interval not displayed. Estimated Creatinine Clearance: 26.3 ml/min (by C-G formula based on Cr of 3.43).    Recent Labs  01/12/14 1803 01/13/14 0007 01/13/14 0537  GLUCAP 139* 142* 137*    Medical History: Past Medical History  Diagnosis Date  . Hypertension     Dr. Angela Nevin Homes (PCP)  . Hypercholesterolemia   . Stroke 2011    at [facility] after sex  . SBO (small bowel obstruction) 01/07/2014    Medications:  Prescriptions prior to admission  Medication Sig Dispense Refill  . co-enzyme Q-10 30 MG capsule Take 30 mg by mouth daily.      Marland Kitchen lisinopril (PRINIVIL,ZESTRIL) 20 MG tablet Take 30-40 mg by mouth daily.      . Multiple Vitamin (MULTIVITAMIN WITH MINERALS) TABS tablet Take 1 tablet by mouth daily.      . naproxen sodium (ANAPROX) 220 MG tablet Take 440 mg by mouth daily as needed (for pain).      . Omega-3 Fatty Acids (FISH OIL) 1000 MG CAPS Take 1,000 mg by mouth daily.      Marland Kitchen omeprazole (PRILOSEC) 20 MG capsule Take 20 mg by mouth daily.      . simvastatin (ZOCOR) 20 MG tablet Take 20 mg by mouth daily at 6 PM.        Insulin Requirements in the past 24 hours:  None  Current Nutrition:  NPO, Clinimix E 5/15 at 72ml/hr + Lipids 20% at 10 mL/hr + D5 at 130 ml/hr provides 1,521 kcal per 24 hrs to meet ~70% of nutrition goals   Nutritional Goals:  2200 - 2400 Kcal; 105-120 g Protein; 2.2-2.4 Liters; per RD recommendations.   Assessment: Admit: Admitted with SBO.  GI: SBO this admit- no flatus or BM. S/p ex-lap on 3/27 for SBO and found to have perforated appy and intra abd abscess, s/p appendectomy.  Remains NPO, NGT 200 out yesterday. TPN started on 3/30. Pt meets criteria for severe malnutrition in the context of acute illness and is at risk for refeeding syndrome. No flatus so far. Plan is continue bowel  rest. Prealbumin pending   Endo: No hx. CBGs 100-141 range - pt on D5 at 130 ml/hr for hypernatremia - resolved.  Lytes: Na 143 -improving- remains on D5 130 ml/hr. K 3.8, corrected Ca 10.1. Mg 2, Phos 4.1.  Renal: ARI on admit. Likely combination of prerenal azotemia and ACE-I induced ATN vs. NSAID use. SCr improved slightly to 3.43. UOP 0.9 ml/kg/hr.   Pulm: RA  Cards: Hx HTN/DL. BP ok, HR wnl-levated, TG 215. On no CV meds  Hepatobil: AST/ALT trending up. Albumin remains low. Tbili 2.7 - trending down.  Neuro: Hx CVA. No secondary stroke px -- f/u ASA (sticky note left). Started on CIWA protocol on 3/28.  ID: Zosyn D#6 for SBO and intra-abd infection. Tmax 100.1 F, WBC 9.5  Best Practices: SQ hep  TPN Access: Triple lumen CVC 3/29  TPN day#: 1  Plan:  1) Will increase clinimix E 5/15 to 55 ml/hr; 1825 kcal; 66 gm protein. Will likely advance to goal tomorrow if patient tolerates new TPN rate.  2) Will continue CBG monitoring q6h and sensitive SSI coverage  3) Will decrease MIVF fluid rate to 100 mL/hr to accommodate TPN since Na levels have normalized. Consider d/c D5 in next 24 hours  4) IV Multivitamin daily in TPN. Will also add thiamine and folic acid to TPN and d/c IV push/oral orders.  5) Trace elements M/W/F due to national shortage 6) TPN, Mg, Phos labs   Vinnie Level, PharmD.  Clinical Pharmacist Pager [phone]

## 2014-01-13 NOTE — Progress Notes (Signed)
4 Days Post-Op  Subjective: He is up in the chair, her looks miserable, but doesn't really complain.  He has not been ambulated so far, and no flatus so far.  Objective: Vital signs in last 24 hours: Temp:  [98.3 F (36.8 C)-100.1 F (37.8 C)] 99.2 F (37.3 C) (03/31 0700) Pulse Rate:  [73-82] 78 (03/31 0544) Resp:  [15-22] 20 (03/31 0544) BP: (108-125)/(60-71) 108/68 mmHg (03/31 0544) SpO2:  [94 %-97 %] 97 % (03/31 0544) Weight:  [91.4 kg (201 lb 8 oz)] 91.4 kg (201 lb 8 oz) (03/31 0500) Last BM Date: 01/06/14 TNA/NPO 100 ml recorded from the NG 42 ml from drain Creatinine is at 3.34, Bilirubin improving No films Intake/Output from previous day: 03/30 0701 - 03/31 0700 In: 2280 [I.V.:1790; IV Piggyback:50; TPN:440] Out: 2067.5 [Urine:1925; Emesis/NG output:100; Drains:42.5] Intake/Output this shift:    General appearance: alert, cooperative, no distress and just looks uncomfortable. Resp: clear to auscultation bilaterally and anterior exam GI: he has no bowel sound, he's sore and hurts all over.  Abd is most tender around the drain.  Wound is OK drain is serosanguinous.  Lab Results:   Recent Labs  01/12/14 0300 01/13/14 0400  WBC 8.5 9.5  HGB 7.9* 7.4*  HCT 23.1* 20.6*  PLT 286 268    BMET  Recent Labs  01/13/14 0101 01/13/14 0400  NA 143 143  K 3.9 3.8  CL 103 103  CO2 25 25  GLUCOSE 142* 141*  BUN 48* 50*  CREATININE 3.50* 3.43*  CALCIUM 7.9* 8.4   PT/INR No results found for this basename: LABPROT, INR,  in the last 72 hours   Recent Labs Lab 01/09/14 0532 01/10/14 0605 01/11/14 0600 01/12/14 0300 01/13/14 0400  AST 42* 33 65* 130* 140*  ALT 59* 49 54* 76* 81*  ALKPHOS 83 83 61 59 57  BILITOT 6.3* 6.4* 5.2* 3.6* 2.7*  PROT 7.2 7.0 6.9 7.1 6.9  ALBUMIN 2.4* 2.1* 2.0* 2.0* 1.9*     Lipase     Component Value Date/Time   LIPASE 24 01/06/2014 2345     Studies/Results: Dg Chest Port 1 View  01/11/2014   CLINICAL DATA:  Central line  placement  EXAM: PORTABLE CHEST - 1 VIEW  COMPARISON:  Prior acute abdominal series and chest x-ray 01/07/2014  FINDINGS: Interval placement of a right IJ approach central venous catheter. The catheter tip projects over the mid SVC. A nasogastric tube is been placed, the tip lies off the field of view but below the diaphragm and likely within the stomach or proximal small bowel. Increasing right lower lobe opacity favored to reflect progressive atelectasis. Cardiac and mediastinal contours remain unchanged. No evidence of pneumothorax. No acute osseous abnormality.  IMPRESSION: 1. The tip of the new right IJ central venous catheter projects over the mid SVC. No evidence of complicating pneumothorax. 2. A nasogastric tube is present. The tip lies below the diaphragm off the field of view presumably within the stomach or proximal small bowel. 3. Increasing right basilar opacity favored to reflect atelectasis. Developing infiltrate difficult to exclude.   Electronically Signed   By: Malachy MoanHeath  McCullough M.D.   On: 01/11/2014 21:35    Medications: . antiseptic oral rinse  15 mL Mouth Rinse q12n4p  . chlorhexidine  15 mL Mouth Rinse BID  . Chlorhexidine Gluconate Cloth  6 each Topical Daily  . folic acid  1 mg Oral Daily  . heparin  5,000 Units Subcutaneous 3 times per day  .  HYDROmorphone PCA 0.3 mg/mL   Intravenous 6 times per day  . insulin aspart  0-9 Units Subcutaneous 4 times per day  . mupirocin ointment  1 application Nasal BID  . piperacillin-tazobactam (ZOSYN)  IV  3.375 g Intravenous Q8H  . thiamine  100 mg Oral Daily   Or  . thiamine  100 mg Intravenous Daily    Assessment/Plan Admitted with SBO, at surgery found to have Perforated appendix, Intraabdominal abscess and SBO Exploratory laparotomy, LOA, appendectomy and drainage of abscess([name] 01/09/14)  Post op ileus Acute renal failure Acute liver injury Hypertension  Hx of Stroke dyslipidemia   Plan:  Continue bowel rest,  start to mobilize more.  Ask PT to see and begin to ambulate. IV tylenol to help with pain, and decrease narcotic use.   LOS: 6 days    Lonnie Henry 01/13/2014

## 2014-01-13 NOTE — Evaluation (Signed)
Physical Therapy Evaluation Patient Details Name: Kathaleen MaserCarl Hershberger MRN: [id number] DOB: 01/08/1951 Today's Date: 01/13/2014   History of Present Illness  Adm 3/25 and underwent Exploratory laparotomy, LOA, appendectomy and drainage of abscess(01/09/14)    Clinical Impression  Patient is s/p SBO and appendectomy surgery resulting in functional limitations due to the deficits listed below (see PT Problem List).  Patient will benefit from skilled PT to increase their independence and safety with mobility to allow discharge to the venue listed below.       Follow Up Recommendations No PT follow up;Supervision - Intermittent    Equipment Recommendations  Rolling walker with 5" wheels    Recommendations for Other Services OT consult     Precautions / Restrictions Precautions Precautions:  (JP drain, NG tube, 2 IV poles) Required Braces or Orthoses: Other Brace/Splint Other Brace/Splint: abd binder      Mobility  Bed Mobility               General bed mobility comments: NT OOB on arrival  Transfers Overall transfer level: Needs assistance Equipment used: Rolling walker (2 wheeled) Transfers: Sit to/from Stand Sit to Stand: Min assist         General transfer comment: vc for technique to get his COM over his BOS and safe use of DME  Ambulation/Gait Ambulation/Gait assistance: Min guard Ambulation Distance (Feet): 70 Feet Assistive device: Rolling walker (2 wheeled) Gait Pattern/deviations: Trunk flexed   Gait velocity interpretation: <1.8 ft/sec, indicative of risk for recurrent falls General Gait Details: very flexed posture despite cues and rationale for need to stand upright; required 3 standing rest breaks where he leaned on his forearms on RW  Stairs            Wheelchair Mobility    Modified Rankin (Stroke Patients Only)       Balance                                     Pertinent Vitals/Pain States 2-3/10 abd pain at rest; unable to  rate during activity; patient repositioned for comfort     Home Living Family/patient expects to be discharged to:: Private residence Living Arrangements: Spouse/significant other   Type of Home: House Home Access: Stairs to enter Entrance Stairs-Rails: Left Entrance Stairs-Number of Steps: 2 Home Layout: Multi-level;Able to live on main level with bedroom/bathroom;Laundry or work area in [employer] Insurancebasement Home Equipment: Crutches      Prior Function Level of Independence: Independent         Comments: 2 jobs; Runner, broadcasting/film/videoteacher, 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; 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     [employer] Dominance        Extremity/Trunk Assessment   Upper Extremity Assessment: RUE deficits/detail RUE Deficits / Details: recent rotator cuff surgery 11/23/13; was due to start OPPT for shoulder this week         Lower Extremity Assessment: Overall WFL for tasks assessed      Cervical / Trunk Assessment: Other exceptions  Communication   Communication: No difficulties  Cognition Arousal/Alertness: Awake/alert Behavior During Therapy: Flat affect Overall Cognitive Status: Within Functional Limits for tasks assessed                      General Comments      Exercises Other Exercises Other Exercises: Instructed pt to continue to work on achieving head and trunk upright/neutral position (sitting in chair or  supine)      Assessment/Plan    PT Assessment Patient needs continued PT services  PT Diagnosis Difficulty walking;Acute pain   PT Problem List Decreased activity tolerance;Decreased mobility;Decreased knowledge of use of DME;Pain  PT Treatment Interventions DME instruction;Gait training;Stair training;Functional mobility training;Therapeutic activities;Patient/family education   PT Goals (Current goals can be found in the Care Plan section) Acute Rehab PT Goals Patient Stated Goal: to get back to running 6 miles at a time PT Goal Formulation: With patient Time For Goal  Achievement: 01/20/14 Potential to Achieve Goals: Good    Frequency Min 3X/week   Barriers to discharge        End of Session   Activity Tolerance: Patient limited by fatigue;Patient limited by pain Patient left: in chair;with call bell/phone within reach         Time: 0925-1017 PT Time Calculation (min): 52 min   Charges:   PT Evaluation $Initial PT Evaluation Tier I: 1 Procedure PT Treatments $Gait Training: 38-52 mins   PT G Codes:          Varetta Chavers 01/23/14, 10:31 AM Pager [phone]

## 2014-01-14 DIAGNOSIS — E46 Unspecified protein-calorie malnutrition: Secondary | ICD-10-CM

## 2014-01-14 DIAGNOSIS — K3532 Acute appendicitis with perforation and localized peritonitis, without abscess: Secondary | ICD-10-CM

## 2014-01-14 DIAGNOSIS — R740 Nonspecific elevation of levels of transaminase and lactic acid dehydrogenase [LDH]: Secondary | ICD-10-CM

## 2014-01-14 DIAGNOSIS — R7401 Elevation of levels of liver transaminase levels: Secondary | ICD-10-CM

## 2014-01-14 LAB — CBC
HCT: 20 % — ABNORMAL LOW (ref 39.0–52.0)
HEMOGLOBIN: 7.2 g/dL — AB (ref 13.0–17.0)
MCH: 34.4 pg — ABNORMAL HIGH (ref 26.0–34.0)
MCHC: 36 g/dL (ref 30.0–36.0)
MCV: 95.7 fL (ref 78.0–100.0)
Platelets: 278 10*3/uL (ref 150–400)
RBC: 2.09 MIL/uL — ABNORMAL LOW (ref 4.22–5.81)
RDW: 13.3 % (ref 11.5–15.5)
WBC: 8.3 10*3/uL (ref 4.0–10.5)

## 2014-01-14 LAB — BASIC METABOLIC PANEL
BUN: 52 mg/dL — ABNORMAL HIGH (ref 6–23)
CO2: 23 meq/L (ref 19–32)
Calcium: 8.3 mg/dL — ABNORMAL LOW (ref 8.4–10.5)
Chloride: 100 mEq/L (ref 96–112)
Creatinine, Ser: 2.88 mg/dL — ABNORMAL HIGH (ref 0.50–1.35)
GFR calc Af Amer: 26 mL/min — ABNORMAL LOW (ref >90)
GFR, EST NON AFRICAN AMERICAN: 22 mL/min — AB (ref >90)
Glucose, Bld: 138 mg/dL — ABNORMAL HIGH (ref 70–99)
POTASSIUM: 3.9 meq/L (ref 3.7–5.3)
SODIUM: 138 meq/L (ref 137–147)

## 2014-01-14 LAB — GLUCOSE, CAPILLARY
GLUCOSE-CAPILLARY: 123 mg/dL — AB (ref 70–99)
Glucose-Capillary: 113 mg/dL — ABNORMAL HIGH (ref 70–99)
Glucose-Capillary: 122 mg/dL — ABNORMAL HIGH (ref 70–99)
Glucose-Capillary: 129 mg/dL — ABNORMAL HIGH (ref 70–99)
Glucose-Capillary: 129 mg/dL — ABNORMAL HIGH (ref 70–99)

## 2014-01-14 LAB — MAGNESIUM: MAGNESIUM: 2.2 mg/dL (ref 1.5–2.5)

## 2014-01-14 LAB — ANAEROBIC CULTURE

## 2014-01-14 LAB — HEPATIC FUNCTION PANEL
ALT: 125 U/L — ABNORMAL HIGH (ref 0–53)
AST: 164 U/L — ABNORMAL HIGH (ref 0–37)
Albumin: 2 g/dL — ABNORMAL LOW (ref 3.5–5.2)
Alkaline Phosphatase: 61 U/L (ref 39–117)
BILIRUBIN INDIRECT: 0.9 mg/dL (ref 0.3–0.9)
Bilirubin, Direct: 1.3 mg/dL — ABNORMAL HIGH (ref 0.0–0.3)
TOTAL PROTEIN: 6.9 g/dL (ref 6.0–8.3)
Total Bilirubin: 2.2 mg/dL — ABNORMAL HIGH (ref 0.3–1.2)

## 2014-01-14 LAB — CK: CK TOTAL: 1619 U/L — AB (ref 7–232)

## 2014-01-14 LAB — PHOSPHORUS: PHOSPHORUS: 4.2 mg/dL (ref 2.3–4.6)

## 2014-01-14 LAB — LACTIC ACID, PLASMA: LACTIC ACID, VENOUS: 1.1 mmol/L (ref 0.5–2.2)

## 2014-01-14 MED ORDER — SODIUM CHLORIDE 0.45 % IV SOLN
INTRAVENOUS | Status: DC
  Administered 2014-01-14: 13:00:00 via INTRAVENOUS

## 2014-01-14 MED ORDER — TRACE MINERALS CR-CU-F-FE-I-MN-MO-SE-ZN IV SOLN
INTRAVENOUS | Status: AC
  Administered 2014-01-14: 18:00:00 via INTRAVENOUS
  Filled 2014-01-14: qty 2000

## 2014-01-14 MED ORDER — FAT EMULSION 20 % IV EMUL
250.0000 mL | INTRAVENOUS | Status: AC
  Administered 2014-01-14: 250 mL via INTRAVENOUS
  Filled 2014-01-14: qty 250

## 2014-01-14 MED ORDER — DEXTROSE 5 % IV SOLN: INTRAVENOUS | Status: DC

## 2014-01-14 NOTE — Progress Notes (Signed)
Internal Medicine Attending  Date: 01/14/2014  Patient name: Lonnie Henry Medical record number: [id number] Date of birth: [birth year] Age: 63 y.o. Gender: male  I saw and evaluated the patient, and discussed his care with housestaff.  I reviewed the resident's note by Dr. Claudell Kyleater and I agree with the resident's findings and plans as documented in her note.

## 2014-01-14 NOTE — Progress Notes (Signed)
Subjective:  Lonnie Henry is reporting that his pain is subsiding, but is still painful depending on how he is positioned or moves. He reports that he feels his bowels being active but has not had a bowel movement and has not had flatulence. He expresses that he is motivated to continue working with Pt and making gains in physical activity.   Objective: Vital signs in last 24 hours: Filed Vitals:   01/13/14 2339 01/14/14 0317 01/14/14 0820 01/14/14 0824  BP:  111/67    Pulse:  71    Temp:  98.6 F (37 C) 98.3 F (36.8 C)   TempSrc:  Oral Oral   Resp: _0 Height:      Weight:      SpO2: 100% 100%  100%   Weight change:   Intake/Output Summary (Last 24 hours) at 01/14/14 1105 Last data filed at 01/14/14 1224  Gross per 24 hour  Intake 4177.84 ml  Output   3135 ml  Net 1042.84 ml   BP 111/67  Pulse 71  Temp(Src) 98.3 F (36.8 C) (Oral)  Resp 17  Ht 6' 2" (1.88 m)  Wt 91.4 kg (201 lb 8 oz)  BMI 25.86 kg/m2  SpO2 100%  General Appearance:    Alert, cooperative, no distress, appears stated age  Head:    Normocephalic, without obvious abnormality, atraumatic  Eyes:    Conjunctiva/corneas clear  Lungs:     Clear to auscultation bilaterally, respirations unlabored  Chest wall:    No tenderness or deformity  Heart:    Regular rate and rhythm, S1 and S2 normal, no murmur, rub   or gallop  Abdomen:     Bowel sounds active in lower quadrant, Tenderness is improved, able to tolerate gentle palpation. Surgical incision over midline of abdomen is not erythematous or inflamed, is clean and dry. Drain in place  Extremities:   Extremities normal, atraumatic, no cyanosis or edema  Neurologic:   Alert and Oriented x3, moves all extremities voluntarily   Lab Results: Results for orders placed during the hospital encounter of 01/07/14 (from the past 24 hour(s))  GLUCOSE, CAPILLARY     Status: Abnormal   Collection Time    01/13/14 11:58 AM      Result Value Ref Range   Glucose-Capillary 143 (*) 70 - 99 mg/dL   Comment 1 Documented in Chart     Comment 2 Notify RN    GLUCOSE, CAPILLARY     Status: Abnormal   Collection Time    01/13/14  5:20 PM      Result Value Ref Range   Glucose-Capillary 145 (*) 70 - 99 mg/dL   Comment 1 Documented in Chart     Comment 2 Notify RN    BASIC METABOLIC PANEL     Status: Abnormal   Collection Time    01/13/14  6:00 PM      Result Value Ref Range   Sodium 141  137 - 147 mEq/L   Potassium 3.6 (*) 3.7 - 5.3 mEq/L   Chloride 101  96 - 112 mEq/L   CO2 24  19 - 32 mEq/L   Glucose, Bld 139 (*) 70 - 99 mg/dL   BUN 55 (*) 6 - 23 mg/dL   Creatinine, Ser 3.31 (*) 0.50 - 1.35 mg/dL   Calcium 8.5  8.4 - 10.5 mg/dL   GFR calc non Af Amer 19 (*) >90 mL/min   GFR calc Af Amer 22 (*) >90 mL/min  CK     Status: Abnormal   Collection Time    01/13/14  6:00 PM      Result Value Ref Range   Total CK 2257 (*) 7 - 232 U/L  GLUCOSE, CAPILLARY     Status: Abnormal   Collection Time    01/13/14 11:41 PM      Result Value Ref Range   Glucose-Capillary 129 (*) 70 - 99 mg/dL   Comment 1 Notify RN     Comment 2 Documented in Chart    MAGNESIUM     Status: None   Collection Time    01/14/14  3:24 AM      Result Value Ref Range   Magnesium 2.2  1.5 - 2.5 mg/dL  PHOSPHORUS     Status: None   Collection Time    01/14/14  3:24 AM      Result Value Ref Range   Phosphorus 4.2  2.3 - 4.6 mg/dL  CBC     Status: Abnormal   Collection Time    01/14/14  3:24 AM      Result Value Ref Range   WBC 8.3  4.0 - 10.5 K/uL   RBC 2.09 (*) 4.22 - 5.81 MIL/uL   Hemoglobin 7.2 (*) 13.0 - 17.0 g/dL   HCT 20.0 (*) 39.0 - 52.0 %   MCV 95.7  78.0 - 100.0 fL   MCH 34.4 (*) 26.0 - 34.0 pg   MCHC 36.0  30.0 - 36.0 g/dL   RDW 13.3  11.5 - 15.5 %   Platelets 278  150 - 400 K/uL  BASIC METABOLIC PANEL     Status: Abnormal   Collection Time    01/14/14  3:24 AM      Result Value Ref Range   Sodium 138  137 - 147 mEq/L   Potassium 3.9  3.7 - 5.3  mEq/L   Chloride 100  96 - 112 mEq/L   CO2 23  19 - 32 mEq/L   Glucose, Bld 138 (*) 70 - 99 mg/dL   BUN 52 (*) 6 - 23 mg/dL   Creatinine, Ser 2.88 (*) 0.50 - 1.35 mg/dL   Calcium 8.3 (*) 8.4 - 10.5 mg/dL   GFR calc non Af Amer 22 (*) >90 mL/min   GFR calc Af Amer 26 (*) >90 mL/min  CK     Status: Abnormal   Collection Time    01/14/14  3:24 AM      Result Value Ref Range   Total CK 1619 (*) 7 - 232 U/L  HEPATIC FUNCTION PANEL     Status: Abnormal   Collection Time    01/14/14  3:24 AM      Result Value Ref Range   Total Protein 6.9  6.0 - 8.3 g/dL   Albumin 2.0 (*) 3.5 - 5.2 g/dL   AST 164 (*) 0 - 37 U/L   ALT 125 (*) 0 - 53 U/L   Alkaline Phosphatase 61  39 - 117 U/L   Total Bilirubin 2.2 (*) 0.3 - 1.2 mg/dL   Bilirubin, Direct 1.3 (*) 0.0 - 0.3 mg/dL   Indirect Bilirubin 0.9  0.3 - 0.9 mg/dL  LACTIC ACID, PLASMA     Status: None   Collection Time    01/14/14  5:30 AM      Result Value Ref Range   Lactic Acid, Venous 1.1  0.5 - 2.2 mmol/L  GLUCOSE, CAPILLARY     Status: Abnormal   Collection Time  01/14/14  5:44 AM      Result Value Ref Range   Glucose-Capillary 123 (*) 70 - 99 mg/dL    Micro Results: Recent Results (from the past 240 hour(s))  SURGICAL PCR SCREEN     Status: Abnormal   Collection Time    01/09/14  4:48 AM      Result Value Ref Range Status   MRSA, PCR NEGATIVE  NEGATIVE Final   Staphylococcus aureus POSITIVE (*) NEGATIVE Final   Comment:            The Xpert SA Assay (FDA     approved for NASAL specimens     in patients over 60 years of age),     is one component of     a comprehensive surveillance     program.  Test performance has     been validated by [employer] for patients greater     than or equal to 50 year old.     It is not intended     to diagnose infection nor to     guide or monitor treatment.  ANAEROBIC CULTURE     Status: None   Collection Time    01/09/14  1:46 PM      Result Value Ref Range Status   Specimen  Description PERITONEAL FLUID   Final   Special Requests FLUID ON SWAB PT ON ZOSYN   Final   Gram Stain     Final   Value: RARE WBC PRESENT, PREDOMINANTLY MONONUCLEAR     NO SQUAMOUS EPITHELIAL CELLS SEEN     RARE GRAM POSITIVE COCCI     IN PAIRS     Performed at [facility]   Culture     Final   Value: NO ANAEROBES ISOLATED; CULTURE IN PROGRESS FOR 5 DAYS     Note: Gram Stain Report Called to,Read Back By and Verified With: DORA GARDNER ON 01/09/2014 AT 11:27P BY WILEJ     Performed at [facility]   Report Status PENDING   Incomplete  BODY FLUID CULTURE     Status: None   Collection Time    01/09/14  1:46 PM      Result Value Ref Range Status   Specimen Description PERITONEAL FLUID   Final   Special Requests FLUID ON SWAB PT ON ZOSYN   Final   Gram Stain     Final   Value: WBC PRESENT, PREDOMINANTLY MONONUCLEAR     GRAM POSITIVE COCCI     IN PAIRS     Performed at [facility]   Culture     Final   Value: RARE ESCHERICHIA COLI     MODERATE STREPTOCOCCUS GROUP C     Note: Gram Stain Report Called to,Read Back By and Verified With: DORA GARDNER ON 01/09/2014 AT 11:27P BY WILEJ     Performed at [facility]   Report Status 01/12/2014 FINAL   Final   Organism ID, Bacteria ESCHERICHIA COLI   Final   Organism ID, Bacteria STREPTOCOCCUS GROUP C   Final   Studies/Results: No results found. Medications: I have reviewed the patient's current medications. Scheduled Meds: . antiseptic oral rinse  15 mL Mouth Rinse q12n4p  . chlorhexidine  15 mL Mouth Rinse BID  . heparin  5,000 Units Subcutaneous 3 times per day  . HYDROmorphone PCA 0.3 mg/mL   Intravenous 6 times per day  . insulin aspart  0-9 Units Subcutaneous 4  times per day  . piperacillin-tazobactam (ZOSYN)  IV  3.375 g Intravenous Q8H   Continuous Infusions: . dextrose    . dextrose    . fat emulsion 250 mL (01/14/14 0800)   And  . Marland KitchenTPN (CLINIMIX-E) Adult 55 mL/hr at 01/14/14 0800  . Marland KitchenTPN  (CLINIMIX-E) Adult     And  . fat emulsion     PRN Meds:.diphenhydrAMINE, diphenhydrAMINE, morphine injection, naloxone, ondansetron, sodium chloride  Assessment/Plan: Lonnie Henry is a 63 yo man with a history of stroke and HTN that presented with abdominal pain, nausea, and vomiting. Found to have a small bowel obstruction and subsequently ruptured appendicitis on exploratory laparotomy.   Principal Problem:   Small bowel obstruction Active Problems:   Acute kidney injury   High anion gap metabolic acidosis   Hyponatremia   Hyperbilirubinemia  #Acute small bowel obstruction/peritonitis/ruptured appendicitis with intraabdominal abscess - Now POD#5 for exploratory laparotomy with ruptured appendicitis, SBO, lysis of adhesions. Improvement in pain today and bowel sounds heard. Still no flatus or bowel movement - Appreciate surgery recs  - Continue NPO, NG tube  - Continue PCA Dilaudid pump, zofran for symptomatic relief  - Continue IV Tylenol QID  - Continue Zosyn IV  - Incentive spirometry  - Mobilize OOB, PT eval and treat, OT eval and treat   #Hypernatremia/hyperchloremia, resolved - Likely iatrogenic from aggressive IVF resuscitation combined with NGT losses and insensible losses from surgery. Steady trend down to normal overnight. We have transitioned fluids to maintenance rate. Most recent sodium level at 138 (4/1) - Discontinue D5W at 181m/hr, now that he is tolerating TPN   - Start 1/2 NS 70 ml/hr - Monitor BMP BID   #Refeeding - Dietitian and pharmacy consulted for TPN given prolonged n.p.o. Status. He has an IJ central line in correct position per PCCM.  - Appreciate nutrition and pharmacy recs  - TPN per pharmacy  - Will continue CBG monitoring q6h and sensitive SSI coverage  - IV Multivitamin, thiamine, folic acid daily in TPN  - Trace elements M/W/F due to national shortage  - Discontinue TPN labs - stable for the past three days   #Acute on chronic anemia - Likely  dilutional plus acute blood loss anemia, to be expected s/p abdominal surgery. Also iatrogenic from serial blood draws. Patient has a history of macrocytic anemia of unknown origin [facility] [place]. Nutritional markers were normal and he even had a bone marrow biopsy that was unremarkable. No gross bleeding. NGT output is bilious. Smear here showed increased bands (>20%), no signs of hemolysis.  - Continue to monitor  - Transfusion threshold Hgb <7  Hemoglobin & Hematocrit     Component Value Date/Time   HGB 7.2* 01/14/2014 0324   HCT 20.0* 01/14/2014 0324    #Acute kidney injury - Presented with a Cr of 8.62, from his last known baseline of 1.2. This likely represented a combination of prerenal azotemia and ATN. Cr downtrended with IVF resuscitation, now stabilized at ~3.5. Suspect continued pre-renal azotemia from insensible losses during surgery/NPO +/- ATN. CK elevated at 2764 (it was 265 on admission), suspect resolving rhabdomyolysis. AST also slightly elevated also which can suggest muscle injury.  - IVF per above  - Holding nephrotoxic meds  - Trend BMETs  - Trend CK  CK - 1619 (4/1) down from 2257 Serum Creatnine 2.88 (4/1) down from 3.31  #Hyperbilirubinemia - Bili labs below, slowly trending down. Mostly direct  bilirubinemia. This is likely secondary to hypoperfusion from hypovolemia, but intrahepatic process could also be responsible. Hepatitis panel negative. HIV NR. RUQ [place] with no obvious obstruction, but may need additional imaging such as MRCP vs ERCP if no resolution.  - Continue to monitor  Bilirubin    Component  Value  Date/Time    BILITOT  2.7*  01/13/2014 0400    BILIDIR  2.4*  01/12/2014 0300    IBILI  1.2*  01/12/2014 0300    Bilirubin     Component Value Date/Time   BILITOT 2.2* 01/14/2014 0324   BILIDIR 1.3* 01/14/2014 0324   IBILI 0.9 01/14/2014 0324   #Elevated anion gap - AG 18 today, likely secondary to  uremia vs. Starvation ketoacidosis. Lactate has been normal but will check again in am.  - IVF per above  - Trend BMET  - Repeat lactate in am   #Prophy - Heparin subq, SCDs  Dispo: Disposition is deferred at this time, awaiting improvement of current medical problems. Anticipated discharge in approximately 2-3 day(s).  The patient does have a current PCP (Dionne Volanda Napoleon, MD) and does not need an [facility] hospital follow-up appointment after discharge.  The patient does not have transportation limitations that hinder transportation to clinic appointments.  .Services Needed at time of discharge: Y = Yes, Blank = No  PT:  No PT follow up;Supervision - Intermittent   OT:    RN:    Equipment:  Rolling walker with 5" wheels   Other:     This is a Careers information officer Note.  The care of the patient was discussed with Dr. Lucila Maine and the assessment and plan formulated with their assistance.  Please see their attached note for official documentation of the daily encounter.   LOS: 7 days   Durene Fruits, Med Student 01/14/2014, 11:05 AM

## 2014-01-14 NOTE — Progress Notes (Signed)
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 Pt presents with below deficits and will benefit from skilled OT services to maximize independence with ADL and functional mobility to facilitate D/C home with intermittent S . Pt ambulated @ 120 ft today @  RW level.    Follow Up Recommendations  No OT follow up;Supervision - Intermittent (needs to follow up for outpt therapy s/p RTC repair)    Equipment Recommendations       Recommendations for Other Services       Precautions / Restrictions Precautions Precautions: Other (comment) (JP drain, NG tube, 2 IV poles) Required Braces or Orthoses: Other Brace/Splint Other Brace/Splint: abd binder      Mobility Bed Mobility               General bed mobility comments: NT OOB on arrival  Transfers Overall transfer level: Needs assistance Equipment used: Rolling walker (2 wheeled)   Sit to Stand: Min assist              Balance Overall balance assessment: No apparent balance deficits (not formally assessed)                                          ADL Overall ADL's : Needs assistance/impaired Eating/Feeding: NPO   Grooming: Set up   Upper Body Bathing: Set up;Sitting   Lower Body Bathing: Moderate assistance;Sit to/from stand Lower Body Bathing Details (indicate cue type and reason): educated pt on cross leg technique Upper Body Dressing : Set up;Sitting   Lower Body Dressing: Moderate assistance;Sit to/from stand Lower Body Dressing Details (indicate cue type and reason): limited by pain Toilet Transfer: Min guard           Functional mobility during ADLs: Minimal  assistance;Rolling walker;+2 for safety/equipment General ADL Comments: Ambulated @120  ft RW level with min A. Educated on compensatory techniques     Vision                     Perception     Praxis      Pertinent Vitals/Pain 5/10 abdominal pain VSS     Hand Dominance     Extremity/Trunk Assessment Upper Extremity Assessment Upper Extremity Assessment: RUE deficits/detail RUE Deficits / Details: recent rotator cuff surgery 11/23/13; was due to start OPPT for shoulder this week   Lower Extremity Assessment Lower Extremity Assessment: Overall WFL for tasks assessed   Cervical / Trunk Assessment Cervical / Trunk Assessment: Other exceptions Cervical / Trunk Exceptions: pt holds neck and trunk in flexion reporting pain if he extends to upright/neutral (incision and Rt jugular access)   Communication Communication Communication: No difficulties   Cognition Arousal/Alertness: Awake/alert Behavior During Therapy: WFL for tasks assessed/performed Overall Cognitive Status: Within Functional Limits for tasks assessed                     General Comments       Exercises Exercises: Other exercises     Shoulder Instructions      Home Living Family/patient expects to be discharged to:: Private residence Living  Arrangements: Spouse/significant other Available Help at Discharge: Available PRN/intermittently Type of Home: House Home Access: Stairs to enter [employer] of Steps: 2 Entrance Stairs-Rails: Left Home Layout: Multi-level;Able to live on main level with bedroom/bathroom;Laundry or work area in [employer] Shower/Tub: Tub/shower unit;Walk-in shower Shower/tub characteristics: Buyer, retail: Yes How Accessible: Accessible via walker Home Equipment: Crutches   Additional Comments: daughter has shower bench they can borrow      Prior Functioning/Environment Level of Independence:  Independent        Comments: 2 jobs; Runner, broadcasting/film/video, 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; 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    OT Diagnosis: Generalized weakness;Acute pain   OT Problem List: Decreased strength;Decreased activity tolerance;Decreased knowledge of use of DME or AE;Pain   OT Treatment/Interventions: Self-care/ADL training;Therapeutic exercise;Energy conservation;DME and/or AE instruction;Therapeutic activities;Patient/family education;Balance training    OT Goals(Current goals can be found in the care plan section) Acute Rehab OT Goals Patient Stated Goal: to get back to the gym OT Goal Formulation: With patient Time For Goal Achievement: 01/28/14 Potential to Achieve Goals: Good  OT Frequency: Min 2X/week   Barriers to D/C:            Co-evaluation              End of Session Equipment Utilized During Treatment: Gait belt;Rolling walker;Other (comment) (abdominal binder) Nurse Communication: Mobility status  Activity Tolerance: Patient tolerated treatment well Patient left: in chair;with call bell/phone within reach;with family/visitor present   Time: 1715-1756 OT Time Calculation (min): 41 min Charges:  OT General Charges $OT Visit: 1 Procedure OT Evaluation $Initial OT Evaluation Tier I: 1 Procedure OT Treatments $Self Care/Home Management : 23-37 mins G-Codes:    Alyxander Kollmann,[name] February 09, 2014, 6:28 PM   [facility], OTR/L  [phone] 02/09/2014

## 2014-01-14 NOTE — Progress Notes (Signed)
5 Days Post-Op  Subjective: He was able to walk some yesterday and he is amazed at how hard it was.  He is a runner. No flatus so far.    Objective: Vital signs in last 24 hours: Temp:  [97.4 F (36.3 C)-98.6 F (37 C)] 98.3 F (36.8 C) (04/01 0820) Pulse Rate:  [71-72] 71 (04/01 0317) Resp:  [14-20] 17 (04/01 0317) BP: (108-122)/(66-90) 111/67 mmHg (04/01 0317) SpO2:  [95 %-100 %] 100 % (04/01 0317) Last BM Date: 01/06/14 650 from the NG recorded yesterday, 90 ml from the drain No Bm NPO/TNA Afebrile, BSS Creatinine is improving Transaminase is up some Anemia Intake/Output from previous day: 03/31 0701 - 04/01 0700 In: 4216.8 [I.V.:2790.7; IV Piggyback:150; TPN:1276.2] Out: 2815 [Urine:2075; Emesis/NG output:650; Drains:90] Intake/Output this shift: Total I/O In: -  Out: 510 [Urine:500; Drains:10]  General appearance: alert, cooperative and no distress GI: He is up in the chair again, but does not looks distended.  I don't hear any bowel sounds, no flatus so far.  Incision looks fine.  Drain is clear serous drainage.  Lab Results:   Recent Labs  01/13/14 0400 01/14/14 0324  WBC 9.5 8.3  HGB 7.4* 7.2*  HCT 20.6* 20.0*  PLT 268 278    BMET  Recent Labs  01/13/14 1800 01/14/14 0324  NA 141 138  K 3.6* 3.9  CL 101 100  CO2 24 23  GLUCOSE 139* 138*  BUN 55* 52*  CREATININE 3.31* 2.88*  CALCIUM 8.5 8.3*   PT/INR No results found for this basename: LABPROT, INR,  in the last 72 hours   Recent Labs Lab 01/10/14 0605 01/11/14 0600 01/12/14 0300 01/13/14 0400 01/14/14 0324  AST 33 65* 130* 140* 164*  ALT 49 54* 76* 81* 125*  ALKPHOS 83 61 59 57 61  BILITOT 6.4* 5.2* 3.6* 2.7* 2.2*  PROT 7.0 6.9 7.1 6.9 6.9  ALBUMIN 2.1* 2.0* 2.0* 1.9* 2.0*     Lipase     Component Value Date/Time   LIPASE 24 01/06/2014 2345     Studies/Results: No results found.  Medications: . antiseptic oral rinse  15 mL Mouth Rinse q12n4p  . chlorhexidine  15 mL  Mouth Rinse BID  . heparin  5,000 Units Subcutaneous 3 times per day  . HYDROmorphone PCA 0.3 mg/mL   Intravenous 6 times per day  . insulin aspart  0-9 Units Subcutaneous 4 times per day  . piperacillin-tazobactam (ZOSYN)  IV  3.375 g Intravenous Q8H    Assessment/Plan Admitted with SBO, at surgery found to have Perforated appendix, Intraabdominal abscess and SBO  Exploratory laparotomy, LOA, appendectomy and drainage of abscess, Dr.[name] 01/09/14.  Post op ileus  Acute renal failure  Acute liver injury  Hypertension  Hx of Stroke  dyslipidemia Anemia  PCM/TNA  Plan:  Continue to ambulate, dressing changes, wait on bowel function to return.  He is on day 7 of Zosyn.  D/c foley.  LOS: 7 days    Lonnie Henry 01/14/2014

## 2014-01-14 NOTE — Progress Notes (Signed)
Agree with above 

## 2014-01-14 NOTE — Progress Notes (Signed)
ANTIBIOTIC CONSULT NOTE - FOLLOW UP  Pharmacy Consult for Zosyn Indication: peritonitis  No Known Allergies  Patient Measurements: Height: 6\' 2"  (188 cm) Weight: 201 lb 8 oz (91.4 kg) IBW/kg (Calculated) : 82.2  Vital Signs: Temp: 98.3 F (36.8 C) (04/01 0820) Temp src: Oral (04/01 0820) BP: 111/67 mmHg (04/01 0317) Pulse Rate: 71 (04/01 0317) Intake/Output from previous day: 03/31 0701 - 04/01 0700 In: 4316.8 [I.V.:2790.7; IV Piggyback:250; TPN:1276.2] Out: 2815 [Urine:2075; Emesis/NG output:650; Drains:90] Intake/Output from this shift: Total I/O In: 380 [I.V.:200; IV Piggyback:50; TPN:130] Out: 510 [Urine:500; Drains:10]  Labs:  Recent Labs  01/12/14 0300  01/13/14 0400 01/13/14 1045 01/13/14 1800 01/14/14 0324  WBC 8.5  --  9.5  --   --  8.3  HGB 7.9*  --  7.4*  --   --  7.2*  PLT 286  --  268  --   --  278  CREATININE  --   < > 3.43* 3.34* 3.31* 2.88*  < > = values in this interval not displayed. Estimated Creatinine Clearance: 31.3 ml/min (by C-G formula based on Cr of 2.88).  Assessment: 62yom POD#5 ex lap, LOA, appendectomy, drainage of abscess continues on day #7 zosyn for peritonitis. Renal function is improving.  Zosyn 3/26 >>  3/27 MRSA PCR positive 3/27 Peritoneal fluid >> + Ecoli and Group C strep (S-zosyn)  Goal of Therapy:  Appropriate zosyn dosing  Plan:  1) Continue zosyn 3.375g IV q8 (4 hour infusion) 2) Continue to follow renal function, LOT  Fredrik RiggerMarkle, Chrisangel Eskenazi Sue 01/14/2014,10:22 AM

## 2014-01-14 NOTE — Progress Notes (Signed)
PARENTERAL NUTRITION CONSULT NOTE - Follow up   Pharmacy Consult for TPN Indication: Prolonged ileus  No Known Allergies  Patient Measurements: Height: 6\' 2"  (188 cm) Weight: 201 lb 8 oz (91.4 kg) IBW/kg (Calculated) : 82.2   Vital Signs: Temp: 98.3 F (36.8 C) (04/01 0820) Temp src: Oral (04/01 0820) BP: 111/67 mmHg (04/01 0317) Pulse Rate: 71 (04/01 0317) Intake/Output from previous day: 03/31 0701 - 04/01 0700 In: 4216.8 [I.V.:2790.7; IV Piggyback:150; TPN:1276.2] Out: 2815 [Urine:2075; Emesis/NG output:650; Drains:90] Intake/Output from this shift: Total I/O In: -  Out: 510 [Urine:500; Drains:10]  Labs:  Recent Labs  01/12/14 0300 01/13/14 0400 01/14/14 0324  WBC 8.5 9.5 8.3  HGB 7.9* 7.4* 7.2*  HCT 23.1* 20.6* 20.0*  PLT 286 268 278     Recent Labs  01/12/14 0300  01/12/14 1100  01/12/14 1600  01/13/14 0101 01/13/14 0400 01/13/14 1045 01/13/14 1800 01/14/14 0324  NA  --   < > 150*  < > 147  < > 143 143 142 141 138  K  --   < > 4.3  < > 4.2  < > 3.9 3.8 3.8 3.6* 3.9  CL  --   < > 110  < > 108  < > 103 103 102 101 100  CO2  --   < > 25  < > 25  < > 25 25 23 24 23   GLUCOSE  --   < > 108*  < > 96  < > 142* 141* 160* 139* 138*  BUN  --   < > 47*  < > 48*  < > 48* 50* 51* 55* 52*  CREATININE  --   < > 3.60*  < > 3.55*  < > 3.50* 3.43* 3.34* 3.31* 2.88*  CALCIUM  --   < > 8.4  < > 8.1*  < > 7.9* 8.4 8.6 8.5 8.3*  MG  --   --  2.1  --   --   --   --  2.0  --   --  2.2  PHOS  --   --   --   --  3.6  --   --  4.1  --   --  4.2  PROT 7.1  --   --   --   --   --   --  6.9  --   --  6.9  ALBUMIN 2.0*  --   --   --   --   --   --  1.9*  --   --  2.0*  AST 130*  --   --   --   --   --   --  140*  --   --  164*  ALT 76*  --   --   --   --   --   --  81*  --   --  125*  ALKPHOS 59  --   --   --   --   --   --  57  --   --  61  BILITOT 3.6*  --   --   --   --   --   --  2.7*  --   --  2.2*  BILIDIR 2.4*  --   --   --   --   --   --   --   --   --  1.3*  IBILI  1.2*  --   --   --   --   --   --   --   --   --  0.9  PREALBUMIN  --   --   --   --   --   --   --  14.5*  --   --   --   TRIG  --   --   --   --   --   --   --  215*  --   --   --   < > = values in this interval not displayed. Estimated Creatinine Clearance: 31.3 ml/min (by C-G formula based on Cr of 2.88).    Recent Labs  01/13/14 1720 01/13/14 2341 01/14/14 0544  GLUCAP 145* 129* 123*    Medical History: Past Medical History  Diagnosis Date  . Hypertension     Dr. Angela Nevin Homes (PCP)  . Hypercholesterolemia   . Stroke 2011    at [facility] after sex  . SBO (small bowel obstruction) 01/07/2014    Medications:  Prescriptions prior to admission  Medication Sig Dispense Refill  . co-enzyme Q-10 30 MG capsule Take 30 mg by mouth daily.      Marland Kitchen lisinopril (PRINIVIL,ZESTRIL) 20 MG tablet Take 30-40 mg by mouth daily.      . Multiple Vitamin (MULTIVITAMIN WITH MINERALS) TABS tablet Take 1 tablet by mouth daily.      . naproxen sodium (ANAPROX) 220 MG tablet Take 440 mg by mouth daily as needed (for pain).      . Omega-3 Fatty Acids (FISH OIL) 1000 MG CAPS Take 1,000 mg by mouth daily.      Marland Kitchen omeprazole (PRILOSEC) 20 MG capsule Take 20 mg by mouth daily.      . simvastatin (ZOCOR) 20 MG tablet Take 20 mg by mouth daily at 6 PM.        Insulin Requirements in the past 24 hours:  2 units of Novolog insulin   Current Nutrition:  NPO, Clinimix E 5/15 to 55 ml/hr + Lipids 20% at 10 mL/hr + D5 at 100 mL/hr; provides 66 gm proteins and 1,825 kcal per 24 hrs to meet ~83% of nutrition goals   Nutritional Goals:  2200 - 2400 Kcal; 105-120 g Protein; 2.2-2.4 Liters; per RD recommendations.   Assessment: Admit: Admitted with SBO.  GI: SBO this admit- no flatus or BM. S/p ex-lap on 3/27 for SBO and found to have perforated appy and intra abd abscess, s/p appendectomy.  Remains NPO. NG tube output at 600 on 3/31. TPN started on 3/30. Pt meets criteria for severe malnutrition in the  context of acute illness and is at risk for refeeding syndrome. No flatus so far. Plan is continue bowel rest. Prealbumin 14.5   Endo: No hx. CBGs 123-145 range - pt on D5 at 100 ml/hr for hypernatremia - resolved. Elevated AG 18. Likely secondary to uremia vs. Starvation ketoacidosis   Lytes: Na 138 -improving- remains on D5 100 ml/hr. K 3.9, corrected Ca 9.9. Mg 2.2, Phos 4.2.  Renal: ARI on admit. Likely combination of prerenal azotemia and ACE-I induced ATN vs. NSAID use. SCr improved to 2.88. UOP 0.9 ml/kg/hr.   Pulm: RA  Cards: Hx HTN/DL. BP ok, HR wnl-levated, TG 215. On no CV meds  Hepatobil: AST/ALT trending up. Albumin remains low. Tbili 2.7 - trending down.  Neuro: Hx CVA. No secondary stroke px -- f/u ASA (sticky note left). Started on CIWA protocol on 3/28.  ID: Zosyn D#7 for SBO and intra-abd infection. Afebrile, WBC 8.3  Best Practices: SQ hep, SCDs  TPN Access: Triple lumen CVC 3/29  TPN  day#: 2  Plan:  1) Will increase Clinimix E 5/15 to 83 ml/hr + Lipids 20% at 10 mL/hr; Provides 1894 kcal (~86% goal); 100 gm protein (~96% goal).  2) Will continue CBG monitoring q6h and sensitive SSI coverage  3) Will decrease MIVF fluid rate to 70 mL/hr to accommodate TPN since Na levels have normalized. Consider d/c of MIVF as patient I/O's +3L in past 2 days.  4) Daily IV MVI, thiamine and folic acid in TPN.  5) Trace elements M/W/F due to national shortage 6) TPN, Mg, Phos labs   Vinnie LevelBenjamin Xavior Niazi, PharmD.  Clinical Pharmacist Pager [phone]

## 2014-01-14 NOTE — Progress Notes (Deleted)
Pt c/o bladder fullness & pain, feels like she needs to void but cannot.  Catheter bag emptied = 450 ml. Bladder scan = .  MD notified, order received to irrigate catheter.

## 2014-01-14 NOTE — Progress Notes (Signed)
Subjective: Patient seen and examined this morning at the bedside. His pain is improving. It still hurts when he moves around, but at rest it is only sore. He can feel and hear his bowels moving, but has not passed flatus yet. He is motivated to keep working with PT and rehabilitate.   Objective: Vital signs in last 24 hours: Filed Vitals:   01/13/14 2339 01/14/14 0317 01/14/14 0820 01/14/14 0824  BP:  111/67    Pulse:  71    Temp:  98.6 F (37 C) 98.3 F (36.8 C)   TempSrc:  Oral Oral   Resp: 19 17  17   Height:      Weight:      SpO2: 100% 100%  100%   Weight change:   Intake/Output Summary (Last 24 hours) at 01/14/14 0850 Last data filed at 01/14/14 1245  Gross per 24 hour  Intake 3706.84 ml  Output   3175 ml  Net 531.84 ml   Physical Exam General: alert, cooperative, appears more comfortable today. HEENT: PERRL, EOMI, oropharynx clear and non-erythematous, NG tube in place with green drainage, scleral jaundice. Neck: supple. Lungs: clear to ascultation bilaterally, normal work of respiration, no wheezes, rales, ronchi. Heart: regular rate and rhythm, no murmurs, gallops, or rubs. Abdomen: Abdominal binder in place. Bowel sounds heard in upper and lower quadrants today. Tenderness is improved, he can tolerate gentle palpation. Surgical incision is clean, dry, intact. JP drain in place to suction with serosanguinous drainage. Extremities: no cyanosis, clubbing, or edema. Neurologic: alert & oriented X3. Moves all extremities voluntarily.  Lab Results: Basic Metabolic Panel:  Recent Labs Lab 01/13/14 0400  01/13/14 1800 01/14/14 0324  NA 143  < > 141 138  K 3.8  < > 3.6* 3.9  CL 103  < > 101 100  CO2 25  < > 24 23  GLUCOSE 141*  < > 139* 138*  BUN 50*  < > 55* 52*  CREATININE 3.43*  < > 3.31* 2.88*  CALCIUM 8.4  < > 8.5 8.3*  MG 2.0  --   --  2.2  PHOS 4.1  --   --  4.2  < > = values in this interval not displayed. Liver Function Tests:  Recent Labs Lab  01/13/14 0400 01/14/14 0324  AST 140* 164*  ALT 81* 125*  ALKPHOS 57 61  BILITOT 2.7* 2.2*  PROT 6.9 6.9  ALBUMIN 1.9* 2.0*   CBC:  Recent Labs Lab 01/13/14 0400 01/14/14 0324  WBC 9.5 8.3  NEUTROABS 7.8*  --   HGB 7.4* 7.2*  HCT 20.6* 20.0*  MCV 96.3 95.7  PLT 268 278   Cardiac Enzymes:  Recent Labs Lab 01/13/14 0400 01/13/14 1800 01/14/14 0324  CKTOTAL 2764* 2257* 1619*   CBG:  Recent Labs Lab 01/13/14 0007 01/13/14 0537 01/13/14 1158 01/13/14 1720 01/13/14 2341 01/14/14 0544  GLUCAP 142* 137* 143* 145* 129* 123*   Coagulation:  Recent Labs Lab 01/09/14 0532  LABPROT 18.0*  INR 1.53*    Micro Results: Recent Results (from the past 240 hour(s))  SURGICAL PCR SCREEN     Status: Abnormal   Collection Time    01/09/14  4:48 AM      Result Value Ref Range Status   MRSA, PCR NEGATIVE  NEGATIVE Final   Staphylococcus aureus POSITIVE (*) NEGATIVE Final   Comment:            The Xpert SA Assay (FDA     approved for  NASAL specimens     in patients over 63 years of age),     is one component of     a comprehensive surveillance     program.  Test performance has     been validated by [employer] for patients greater     than or equal to 13 year old.     It is not intended     to diagnose infection nor to     guide or monitor treatment.  ANAEROBIC CULTURE     Status: None   Collection Time    01/09/14  1:46 PM      Result Value Ref Range Status   Specimen Description PERITONEAL FLUID   Final   Special Requests FLUID ON SWAB PT ON ZOSYN   Final   Gram Stain     Final   Value: RARE WBC PRESENT, PREDOMINANTLY MONONUCLEAR     NO SQUAMOUS EPITHELIAL CELLS SEEN     RARE GRAM POSITIVE COCCI     IN PAIRS     Performed at [facility]   Culture     Final   Value: NO ANAEROBES ISOLATED; CULTURE IN PROGRESS FOR 5 DAYS     Note: Gram Stain Report Called to,Read Back By and Verified With: DORA GARDNER ON 01/09/2014 AT 11:27P BY WILEJ      Performed at [facility]   Report Status PENDING   Incomplete  BODY FLUID CULTURE     Status: None   Collection Time    01/09/14  1:46 PM      Result Value Ref Range Status   Specimen Description PERITONEAL FLUID   Final   Special Requests FLUID ON SWAB PT ON ZOSYN   Final   Gram Stain     Final   Value: WBC PRESENT, PREDOMINANTLY MONONUCLEAR     GRAM POSITIVE COCCI     IN PAIRS     Performed at [facility]   Culture     Final   Value: RARE ESCHERICHIA COLI     MODERATE STREPTOCOCCUS GROUP C     Note: Gram Stain Report Called to,Read Back By and Verified With: DORA GARDNER ON 01/09/2014 AT 11:27P BY WILEJ     Performed at [facility]   Report Status 01/12/2014 FINAL   Final   Organism ID, Bacteria ESCHERICHIA COLI   Final   Organism ID, Bacteria STREPTOCOCCUS GROUP C   Final   Studies/Results: No results found. Medications: I have reviewed the patient's current medications. Scheduled Meds: . antiseptic oral rinse  15 mL Mouth Rinse q12n4p  . chlorhexidine  15 mL Mouth Rinse BID  . heparin  5,000 Units Subcutaneous 3 times per day  . HYDROmorphone PCA 0.3 mg/mL   Intravenous 6 times per day  . insulin aspart  0-9 Units Subcutaneous 4 times per day  . piperacillin-tazobactam (ZOSYN)  IV  3.375 g Intravenous Q8H   Continuous Infusions: . dextrose    . fat emulsion 500 kcal (01/14/14 0600)   And  . Marland KitchenTPN (CLINIMIX-E) Adult 55 mL/hr at 01/14/14 0600   PRN Meds:.diphenhydrAMINE, diphenhydrAMINE, morphine injection, naloxone, ondansetron, sodium chloride  Assessment/Plan: The patient is a 63 yo man, presenting with abdominal pain, nausea, and vomiting, found to have a small bowel obstruction and subsequently ruptured appendicitis on ex-lap.  #Acute small bowel obstruction 2/2 ruptured appendicitis with intraabdominal abscess - Now POD#5 from exploratory laparotomy with ruptured appendicitis, SBO, lysis of  adhesions. Improvement in pain today and bowel  sounds heard. Still no flatus. Peritoneal fluid culture collected in the OR is growing rare E. Coli, moderate streptococcus group C. Anaerobic cultures are NGTD. - Appreciate surgery recs  - Continue NPO, NG tube  - Continue pain control with IV Tylenol QID, PCA Dilaudid pump q4hr, morphine 24m q4h prn severe pain (he is not requiring this) - Zofran q6hr prn nausea - Continue Zosyn IV q8h for intraabdominal infection - Incentive spirometry - Mobilize OOB, PT eval and treat, OT eval and treat - Continue abdominal binder - Discontinue Foley  #Hypernatremia/hyperchloremia, resolved - Likely iatrogenic from aggressive IVF resuscitation combined with NGT losses and insensible losses from surgery. Steady trend down to normal today after aggressive D5W infusion. - Transition fluids to 1/2 NS @70cc /hr - Monitor BMP daily  Sodium  Date Value Ref Range Status  01/14/2014 138  137 - 147 mEq/L Final  01/13/2014 141  137 - 147 mEq/L Final  01/13/2014 142  137 - 147 mEq/L Final  01/13/2014 143  137 - 147 mEq/L Final  01/13/2014 143  137 - 147 mEq/L Final  01/12/2014 145  137 - 147 mEq/L Final  01/12/2014 146  137 - 147 mEq/L Final  01/12/2014 147  137 - 147 mEq/L Final    #Refeeding - Dietitian and pharmacy consulted for TPN given prolonged n.p.o. status. He has an IJ central line in correct position per PCCM. - Appreciate nutrition and pharmacy recs - TPN per pharmacy - Will continue CBG monitoring q6h and sensitive SSI coverage - IV Multivitamin, thiamine, folic acid daily in TPN - Trace elements M/W/F due to national shortage  - TPN labs (including monitor magnesium, potassium, and phosphorus daily for at least 3 days)  #Acute on chronic anemia - Likely dilutional plus acute blood loss anemia, to be expected s/p abdominal surgery. Also iatrogenic from serial blood draws.  Patient has a history of macrocytic anemia of unknown origin [facility] Nutritional markers were normal and he even had a bone marrow biopsy that was unremarkable. No gross bleeding. NGT output is bilious. Smear here showed increased bands (>20%), no signs of hemolysis.  - Continue to monitor - Transfusion threshold Hgb <7  Hemoglobin  Date Value Ref Range Status  01/14/2014 7.2* 13.0 - 17.0 g/dL Final  01/13/2014 7.4* 13.0 - 17.0 g/dL Final  01/12/2014 7.9* 13.0 - 17.0 g/dL Final  01/11/2014 8.4* 13.0 - 17.0 g/dL Final  01/10/2014 10.0* 13.0 - 17.0 g/dL Final    #Acute kidney injury - Presented with a Cr of 8.62, from his last known baseline of 1.2. This likely represented a combination of prerenal azotemia and ATN. Cr downtrended with IVF resuscitation, stabilized at ~3.5 after his surgery, and is now trending down again on POD#5. Suspect resolving pre-renal azotemia from insensible losses during surgery/NPO +/- ATN. CK elevated at 2764 on 3/31 (it was 265 on admission), this could also be a resolving rhabdomyolysis. (AST also elevated which can suggest muscle injury.) - IVF per above  - Holding nephrotoxic meds  - Trend BMETs  - Trend CK  Creatinine, Ser  Date Value Ref Range Status  01/14/2014 2.88* 0.50 - 1.35 mg/dL Final  01/13/2014 3.31* 0.50 - 1.35 mg/dL Final  01/13/2014 3.34* 0.50 - 1.35 mg/dL Final  01/13/2014 3.43* 0.50 - 1.35 mg/dL Final  01/13/2014 3.50* 0.50 - 1.35 mg/dL Final    Total CK  Date Value  Ref Range Status  01/14/2014 1619* 7 - 232 U/L Final  01/13/2014 2257* 7 - 232 U/L Final  01/13/2014 2764* 7 - 232 U/L Final  01/08/2014 265* 7 - 232 U/L Final    #LFT abnormalities and resolving hyperbilirubinemia - Total bilirubin peaked at 7.3 on 3/26. Mostly direct bilirubinemia. LFTs were only mildly elevated at that time. Bili has trended down, but now AST/ALT are rising. This is likely secondary to hypoperfusion from hypovolemia, but intrahepatic process could also be responsible. Hepatitis panel negative. HIV NR. RUQ [place] with no obvious  obstruction. - Continue to monitor  Hepatic Function Panel     Component Value Date/Time   PROT 6.9 01/14/2014 0324   ALBUMIN 2.0* 01/14/2014 0324   AST 164* 01/14/2014 0324   ALT 125* 01/14/2014 0324   ALKPHOS 61 01/14/2014 0324   BILITOT 2.2* 01/14/2014 0324   BILIDIR 1.3* 01/14/2014 0324   IBILI 0.9 01/14/2014 0324    #Elevated anion gap, resolving - AG 15 today, felt secondary to uremia vs. starvation ketoacidosis. Serial lactates have been normal. - IVF as above  - Trend BMET    #Alcohol use - The patient drinks 1-3 craft beers/day x 5 days per week. His last drink was on St. Patrick's Day (March 17). Withdrawal unlikely at this point. CIWAs 0 x several days. - Stop CIWA checks - Thiamine, folate, MV via TPN  #Prophy - Heparin subq, SCDs  Dispo: Disposition is deferred at this time, awaiting improvement of current medical problems.  Anticipated discharge in approximately 2-3 day(s).   The patient does have a current PCP (Dionne Volanda Napoleon, MD) and does not need an [facility] hospital follow-up appointment after discharge.  The patient does not have transportation limitations that hinder transportation to clinic appointments.  .Services Needed at time of discharge: Y = Yes, Blank = No PT: No PT follow up;Supervision - Intermittent  OT:   RN:   Equipment: Rolling walker with 5" wheels   Other:      LOS: 7 days   Lesly Dukes, MD 01/14/2014, 8:50 AM  Lesly Dukes, MD  [email or web address] Pager # [phone] Office # [phone]

## 2014-01-14 NOTE — Progress Notes (Signed)
  I have seen and examined the patient, and reviewed the daily progress note by Camille BalJimmy Chen, MS 3 and discussed the care of the patient with them. Please see my progress note from 01/14/2014 for further details regarding assessment and plan.    Signed:  Vivi BarrackSarah Milanya Sunderland, MD 01/14/2014, 12:16 PM

## 2014-01-15 LAB — GLUCOSE, CAPILLARY
Glucose-Capillary: 133 mg/dL — ABNORMAL HIGH (ref 70–99)
Glucose-Capillary: 127 mg/dL — ABNORMAL HIGH (ref 70–99)
Glucose-Capillary: 126 mg/dL — ABNORMAL HIGH (ref 70–99)

## 2014-01-15 LAB — COMPREHENSIVE METABOLIC PANEL
ALT: 136 U/L — ABNORMAL HIGH (ref 0–53)
AST: 135 U/L — AB (ref 0–37)
Albumin: 2 g/dL — ABNORMAL LOW (ref 3.5–5.2)
Alkaline Phosphatase: 75 U/L (ref 39–117)
BUN: 42 mg/dL — ABNORMAL HIGH (ref 6–23)
CO2: 25 meq/L (ref 19–32)
CREATININE: 2.35 mg/dL — AB (ref 0.50–1.35)
Calcium: 8.2 mg/dL — ABNORMAL LOW (ref 8.4–10.5)
Chloride: 105 mEq/L (ref 96–112)
GFR, EST AFRICAN AMERICAN: 33 mL/min — AB (ref >90)
GFR, EST NON AFRICAN AMERICAN: 28 mL/min — AB (ref >90)
Glucose, Bld: 130 mg/dL — ABNORMAL HIGH (ref 70–99)
Potassium: 3.9 mEq/L (ref 3.7–5.3)
Sodium: 143 mEq/L (ref 137–147)
Total Bilirubin: 1.7 mg/dL — ABNORMAL HIGH (ref 0.3–1.2)
Total Protein: 6.9 g/dL (ref 6.0–8.3)

## 2014-01-15 LAB — CBC
HEMATOCRIT: 19.3 % — AB (ref 39.0–52.0)
HEMATOCRIT: 24 % — AB (ref 39.0–52.0)
HEMOGLOBIN: 6.8 g/dL — AB (ref 13.0–17.0)
HEMOGLOBIN: 8.3 g/dL — AB (ref 13.0–17.0)
MCH: 33.8 pg (ref 26.0–34.0)
MCH: 32.7 pg (ref 26.0–34.0)
MCHC: 35.2 g/dL (ref 30.0–36.0)
MCHC: 34.6 g/dL (ref 30.0–36.0)
MCV: 96 fL (ref 78.0–100.0)
MCV: 94.5 fL (ref 78.0–100.0)
Platelets: 352 10*3/uL (ref 150–400)
Platelets: 417 10*3/uL — ABNORMAL HIGH (ref 150–400)
RBC: 2.01 MIL/uL — ABNORMAL LOW (ref 4.22–5.81)
RBC: 2.54 MIL/uL — AB (ref 4.22–5.81)
RDW: 13.2 % (ref 11.5–15.5)
RDW: 15 % (ref 11.5–15.5)
WBC: 8.1 10*3/uL (ref 4.0–10.5)
WBC: 9.4 10*3/uL (ref 4.0–10.5)

## 2014-01-15 LAB — ABO/RH: ABO/RH(D): A POS

## 2014-01-15 LAB — RETICULOCYTES
RBC.: 2.15 MIL/uL — AB (ref 4.22–5.81)
RETIC COUNT ABSOLUTE: 53.8 10*3/uL (ref 19.0–186.0)
Retic Ct Pct: 2.5 % (ref 0.4–3.1)

## 2014-01-15 LAB — FERRITIN: Ferritin: 937 ng/mL — ABNORMAL HIGH (ref 22–322)

## 2014-01-15 LAB — CK: CK TOTAL: 1046 U/L — AB (ref 7–232)

## 2014-01-15 LAB — FOLATE: FOLATE: 11.9 ng/mL

## 2014-01-15 LAB — HAPTOGLOBIN: Haptoglobin: 348 mg/dL — ABNORMAL HIGH (ref 45–215)

## 2014-01-15 LAB — IRON AND TIBC
Iron: 66 ug/dL (ref 42–135)
Saturation Ratios: 31 % (ref 20–55)
TIBC: 211 ug/dL — ABNORMAL LOW (ref 215–435)
UIBC: 145 ug/dL (ref 125–400)

## 2014-01-15 LAB — TECHNOLOGIST SMEAR REVIEW

## 2014-01-15 LAB — MAGNESIUM: Magnesium: 2 mg/dL (ref 1.5–2.5)

## 2014-01-15 LAB — PREPARE RBC (CROSSMATCH)

## 2014-01-15 LAB — LACTATE DEHYDROGENASE: LDH: 324 U/L — AB (ref 94–250)

## 2014-01-15 LAB — PHOSPHORUS: PHOSPHORUS: 3.4 mg/dL (ref 2.3–4.6)

## 2014-01-15 LAB — VITAMIN B12: VITAMIN B 12: 1958 pg/mL — AB (ref 211–911)

## 2014-01-15 MED ORDER — FAT EMULSION 20 % IV EMUL: 240.0000 mL | INTRAVENOUS | Status: DC

## 2014-01-15 MED ORDER — FAT EMULSION 20 % IV EMUL
240.0000 mL | INTRAVENOUS | Status: AC
  Administered 2014-01-15: 240 mL via INTRAVENOUS
  Filled 2014-01-15: qty 250

## 2014-01-15 MED ORDER — CLINIMIX E/DEXTROSE (5/15) 5 % IV SOLN: INTRAVENOUS | Status: DC

## 2014-01-15 MED ORDER — THIAMINE HCL 100 MG/ML IJ SOLN
INTRAVENOUS | Status: AC
  Administered 2014-01-15: 18:00:00 via INTRAVENOUS
  Filled 2014-01-15: qty 2000

## 2014-01-15 NOTE — Progress Notes (Signed)
Agree with above 

## 2014-01-15 NOTE — Progress Notes (Signed)
Occupational Therapy Treatment Patient Details Name: Lonnie MaserCarl Henry MRN: [id number] DOB: 04/07/1951 Today's Date: 01/15/2014    History of present illness Adm 3/25 and underwent Exploratory laparotomy, LOA, appendectomy and drainage of abscess(01/09/14)     OT comments  Pt to get blood transfusion this pm. Treatment at bed level with focus of session on R shoulder rehab s/p RTC repair. Educated pt on need to complete SROM in supine for FF and ER. Pt verbalized understanding. Will continue to follow to facilitate D/C home.   Follow Up Recommendations  No OT follow up;Supervision - Intermittent    Equipment Recommendations       Recommendations for Other Services      Precautions / Restrictions Precautions Precautions: Other (comment) Required Braces or Orthoses: Other Brace/Splint Other Brace/Splint: abd binder       Mobility Bed Mobility                  Transfers                      Balance                                   ADL                                         General ADL Comments: Completed ADL with tech this am. discussed need to use compensatory techniques for ADL      Vision                     Perception     Praxis      Cognition   Behavior During Therapy: Flat affect Overall Cognitive Status: Difficult to assess                       Extremity/Trunk Assessment               Exercises Other Exercises Other Exercises: R shoulder A/AAROM x 15 supineshoudler flex within tolerance. Hold x 10 sec at end range Other Exercises: R ER x 15 reps with hold x 10 sec at end range Other Exercises: shoulder rolls   Shoulder Instructions       General Comments      Pertinent Vitals/ Pain       C/o R shoulder "tightness" abdominal pain - did not rate  Home Living                                          Prior Functioning/Environment              Frequency  Min 2X/week     Progress Toward Goals  OT Goals(current goals can now be found in the care plan section)  Progress towards OT goals: Progressing toward goals  Acute Rehab OT Goals Patient Stated Goal: to get back to the gym OT Goal Formulation: With patient Time For Goal Achievement: 01/28/14 Potential to Achieve Goals: Good ADL Goals Pt Will Perform Lower Body Bathing: with modified independence;sit to/from stand;with adaptive equipment Pt Will Perform Lower Body Dressing: with modified independence;sit to/from stand;with adaptive equipment Pt Will Transfer to Toilet: with modified independence;ambulating;bedside commode Pt  Will Perform Toileting - Clothing Manipulation and hygiene: with modified independence;sit to/from stand Pt/caregiver will Perform Home Exercise Program: Right Upper extremity;With theraband;Both right and left upper extremity;With written HEP provided  Plan Discharge plan remains appropriate    Co-evaluation                 End of Session     Activity Tolerance Patient limited by fatigue   Patient Left in bed;with call bell/phone within reach   Nurse Communication Mobility status        Time: 1400-1416 OT Time Calculation (min): 16 min  Charges: OT General Charges $OT Visit: 1 Procedure OT Treatments $Therapeutic Exercise: 8-22 mins  [name],[name] 01/15/2014, 2:19 PM   [facility], OTR/L  [phone] 01/15/2014 [name], OTR/L  [phone] 01/15/2014

## 2014-01-15 NOTE — Progress Notes (Signed)
NUTRITION FOLLOW-UP  DOCUMENTATION CODES Per approved criteria  -Severe malnutrition in the context of acute illness or injury   Pt meets criteria for severe MALNUTRITION in the context of acute illness as evidenced by moderate muscle wasting, 8% wt loss x <1 month, and intake of <50% x at least 5 days.  INTERVENTION: TPN per PharmD. Transition to oral nutrition per team's discretion. RD to continue to follow nutrition care plan.  NUTRITION DIAGNOSIS: Inadequate oral intake related to inability to eat as evidenced by npo status. Ongoing.  Goal: Maximize nutrition intake with TPN; aim for provision of 90% estimated kcal and protein. Ongoing.  Monitor:  weight trends, lab trends, I/O's, TPN initiation/tolerance; ability to transition to EN/PO's  ASSESSMENT: PMHx significant for HTN, prior CVA. Admitted with n/v and abdominal pain. Work-up reveals acute SBO.  NGT placed on admission. Continues with NGT at this time, 300 ml output yesterday.  Pt with no improvement with conservative measures from 3/25 - 3/27. Pt taken to OR, underwent exp lap, lysis of adhesions, appendectomy (perforated appendix) and drainage of intraabdominal abscess on 3/27.  Pt with elevated LFT's - team suspects underlying chronic liver disease (?cirrhosis)  Patient receiving TPN with Clinimix E 5/15 @ 83 ml/hr with lipids 20% at 10 ml/hr. This provides 1894 kcal, and 100 grams protein per day. Meets 86% minimum estimated energy needs and 96% minimum estimated protein needs.   Pt has yet to have gas or BM. Continues to feel nauseous.  Potassium WNL. Magnesium WNL. Phosphorus WNL. CBG's: 133, 129, 122 Trig elevated at 215 Prealbumin is low at 14.5.  Height: Ht Readings from Last 1 Encounters:  01/07/14 6\' 2"  (1.88 m)    Weight: Wt Readings from Last 1 Encounters:  01/15/14 188 lb 4.4 oz (85.4 kg)  Admit wt 215 lb - question accuracy of current weight  BMI:  Body mass index is 24.16 kg/(m^2).  WNL  Estimated Nutritional Needs: Kcal: 2200 - 2400 Protein: 105 - 120 g Fluid: 2.2 - 2.4 liters  Skin: closed abdominal incision  Diet Order: NPO   Intake/Output Summary (Last 24 hours) at 01/15/14 0845 Last data filed at 01/15/14 0700  Gross per 24 hour  Intake 3115.73 ml  Output   3755 ml  Net -639.27 ml    Last BM: 3/24  Labs:   Recent Labs Lab 01/13/14 0400  01/13/14 1800 01/14/14 0324 01/15/14 0310  NA 143  < > 141 138 143  K 3.8  < > 3.6* 3.9 3.9  CL 103  < > 101 100 105  CO2 25  < > 24 23 25   BUN 50*  < > 55* 52* 42*  CREATININE 3.43*  < > 3.31* 2.88* 2.35*  CALCIUM 8.4  < > 8.5 8.3* 8.2*  MG 2.0  --   --  2.2 2.0  PHOS 4.1  --   --  4.2 3.4  GLUCOSE 141*  < > 139* 138* 130*  < > = values in this interval not displayed.  CBG (last 3)   Recent Labs  01/14/14 1801 01/14/14 2337 01/15/14 0554  GLUCAP 122* 129* 133*   Triglycerides  Date/Time Value Ref Range Status  01/13/2014  4:00 AM 215* <150 mg/dL Final   Prealbumin  Date/Time Value Ref Range Status  01/13/2014  4:00 AM 14.5* 17.0 - 34.0 mg/dL Final     Performed at [facility]    Scheduled Meds: . antiseptic oral rinse  15 mL Mouth Rinse q12n4p  .  chlorhexidine  15 mL Mouth Rinse BID  . heparin  5,000 Units Subcutaneous 3 times per day  . HYDROmorphone PCA 0.3 mg/mL   Intravenous 6 times per day  . insulin aspart  0-9 Units Subcutaneous 4 times per day  . piperacillin-tazobactam (ZOSYN)  IV  3.375 g Intravenous Q8H    Continuous Infusions: . Marland Kitchen.TPN (CLINIMIX-E) Adult 83 mL/hr at 01/15/14 0600   And  . fat emulsion 500 kcal (01/15/14 0600)    Jarold MottoSamantha Rily Nickey MS, RD, LDN Inpatient Registered Dietitian Pager: [phone] After-hours pager: [phone]

## 2014-01-15 NOTE — Progress Notes (Signed)
Subjective: Patient seen and examined this morning at the bedside. His pain is improving, it is now 4/10. Pain is worse in the RLQ. Coughs/valsalvas make it worse. He can hear and feel his bowels moving, but still no gas or BM. He still feels a little nauseous. He has been working with PT/OT and feels stronger every day.  Objective: Vital signs in last 24 hours: Filed Vitals:   01/15/14 0411 01/15/14 0700 01/15/14 0800 01/15/14 0805  BP:      Pulse:      Temp: 98.8 F (37.1 C)   98 F (36.7 C)  TempSrc: Oral   Oral  Resp: 20  16   Height:      Weight:  188 lb 4.4 oz (85.4 kg)    SpO2: 98%  97%    Weight change:   Intake/Output Summary (Last 24 hours) at 01/15/14 0843 Last data filed at 01/15/14 0700  Gross per 24 hour  Intake 3115.73 ml  Output   3755 ml  Net -639.27 ml   Physical Exam General: alert, cooperative, appears more comfortable today. HEENT: PERRL, EOMI, oropharynx clear and non-erythematous, NG tube in place with green drainage, scleral jaundice. Neck: supple. Lungs: clear to ascultation bilaterally, normal work of respiration, no wheezes, rales, ronchi. Heart: regular rate and rhythm, no murmurs, gallops, or rubs. Abdomen: Abdominal binder in place. Bowel sounds heard in upper and lower quadrants today. Tenderness is improved, he can tolerate gentle palpation. Surgical incision is clean, dry, intact. JP drain in place to suction with sanguinous drainage. Extremities: no cyanosis, clubbing, or edema. Neurologic: alert & oriented X3. Moves all extremities voluntarily.  Lab Results: Basic Metabolic Panel:  Recent Labs Lab 01/14/14 0324 01/15/14 0310  NA 138 143  K 3.9 3.9  CL 100 105  CO2 23 25  GLUCOSE 138* 130*  BUN 52* 42*  CREATININE 2.88* 2.35*  CALCIUM 8.3* 8.2*  MG 2.2 2.0  PHOS 4.2 3.4   Liver Function Tests:  Recent Labs Lab 01/14/14 0324 01/15/14 0310  AST 164* 135*  ALT 125* 136*  ALKPHOS 61 75  BILITOT 2.2* 1.7*  PROT 6.9 6.9    ALBUMIN 2.0* 2.0*   CBC:  Recent Labs Lab 01/13/14 0400 01/14/14 0324  WBC 9.5 8.3  NEUTROABS 7.8*  --   HGB 7.4* 7.2*  HCT 20.6* 20.0*  MCV 96.3 95.7  PLT 268 278   Cardiac Enzymes:  Recent Labs Lab 01/13/14 1800 01/14/14 0324 01/15/14 0310  CKTOTAL 2257* 1619* 1046*   CBG:  Recent Labs Lab 01/13/14 2341 01/14/14 0544 01/14/14 1150 01/14/14 1801 01/14/14 2337 01/15/14 0554  GLUCAP 129* 123* 113* 122* 129* 133*   Coagulation:  Recent Labs Lab 01/09/14 0532  LABPROT 18.0*  INR 1.53*    Micro Results: Recent Results (from the past 240 hour(s))  SURGICAL PCR SCREEN     Status: Abnormal   Collection Time    01/09/14  4:48 AM      Result Value Ref Range Status   MRSA, PCR NEGATIVE  NEGATIVE Final   Staphylococcus aureus POSITIVE (*) NEGATIVE Final   Comment:            The Xpert SA Assay (FDA     approved for NASAL specimens     in patients over 27 years of age),     is one component of     a comprehensive surveillance     program.  Test performance has     been validated  by [facility] for patients greater     than or equal to 49 year old.     It is not intended     to diagnose infection nor to     guide or monitor treatment.  ANAEROBIC CULTURE     Status: None   Collection Time    01/09/14  1:46 PM      Result Value Ref Range Status   Specimen Description PERITONEAL FLUID   Final   Special Requests FLUID ON SWAB PT ON ZOSYN   Final   Gram Stain     Final   Value: RARE WBC PRESENT, PREDOMINANTLY MONONUCLEAR     NO SQUAMOUS EPITHELIAL CELLS SEEN     RARE GRAM POSITIVE COCCI     IN PAIRS     Performed at [facility]   Culture     Final   Value: NO ANAEROBES ISOLATED     Note: Gram Stain Report Called to,Read Back By and Verified With: DORA GARDNER ON 01/09/2014 AT 11:27P BY WILEJ     Performed at [facility]   Report Status 01/14/2014 FINAL   Final  BODY FLUID CULTURE     Status: None   Collection Time    01/09/14   1:46 PM      Result Value Ref Range Status   Specimen Description PERITONEAL FLUID   Final   Special Requests FLUID ON SWAB PT ON ZOSYN   Final   Gram Stain     Final   Value: WBC PRESENT, PREDOMINANTLY MONONUCLEAR     GRAM POSITIVE COCCI     IN PAIRS     Performed at [facility]   Culture     Final   Value: RARE ESCHERICHIA COLI     MODERATE STREPTOCOCCUS GROUP C     Note: Gram Stain Report Called to,Read Back By and Verified With: DORA GARDNER ON 01/09/2014 AT 11:27P BY WILEJ     Performed at [facility]   Report Status 01/12/2014 FINAL   Final   Organism ID, Bacteria ESCHERICHIA COLI   Final   Organism ID, Bacteria STREPTOCOCCUS GROUP C   Final   Studies/Results: No results found. Medications: I have reviewed the patient's current medications. Scheduled Meds: . antiseptic oral rinse  15 mL Mouth Rinse q12n4p  . chlorhexidine  15 mL Mouth Rinse BID  . heparin  5,000 Units Subcutaneous 3 times per day  . HYDROmorphone PCA 0.3 mg/mL   Intravenous 6 times per day  . insulin aspart  0-9 Units Subcutaneous 4 times per day  . piperacillin-tazobactam (ZOSYN)  IV  3.375 g Intravenous Q8H   Continuous Infusions: . Marland KitchenTPN (CLINIMIX-E) Adult 83 mL/hr at 01/15/14 0600   And  . fat emulsion 500 kcal (01/15/14 0600)   PRN Meds:.diphenhydrAMINE, diphenhydrAMINE, morphine injection, naloxone, ondansetron, sodium chloride  Assessment/Plan: The patient is a 63 yo man, presenting with abdominal pain, nausea, and vomiting, found to have a small bowel obstruction and subsequently ruptured appendicitis on ex-lap.  #Acute small bowel obstruction 2/2 ruptured appendicitis with intraabdominal abscess - Now POD#6 from exploratory laparotomy with ruptured appendicitis, SBO, lysis of adhesions. Improvement in pain today and bowel sounds heard. Still no flatus. Peritoneal fluid culture collected in the OR is growing rare E. Coli, moderate streptococcus group C. Anaerobic cultures are NGTD.  Surgical pathology of the appendix shows acute supperative appendicitis.  - Appreciate surgery recs  - Continue NPO, NG tube  -  Continue pain control with IV Tylenol QID, PCA Dilaudid pump q4hr, morphine 70m q4h prn severe pain (he is not requiring this) - Zofran q6hr prn nausea - Continue Zosyn IV q8h for intraabdominal infection (antibiotic day 8) - Incentive spirometry - Mobilize OOB, PT eval and treat, OT eval and treat - Continue abdominal binder  #Hypernatremia/hyperchloremia, resolved - Likely iatrogenic from aggressive IVF resuscitation combined with NGT losses and insensible losses from surgery. Steady trend down to normal after aggressive D5W infusion. - Stop IVF, continue volume resuscitation via TPN - I/Os > -2966cc since admission - Monitor BMP daily  Sodium  Date Value Ref Range Status  01/15/2014 143  137 - 147 mEq/L Final  01/14/2014 138  137 - 147 mEq/L Final  01/13/2014 141  137 - 147 mEq/L Final  01/13/2014 142  137 - 147 mEq/L Final  01/13/2014 143  137 - 147 mEq/L Final  01/13/2014 143  137 - 147 mEq/L Final  01/12/2014 145  137 - 147 mEq/L Final  01/12/2014 146  137 - 147 mEq/L Final    #Refeeding - Dietitian and pharmacy consulted for TPN given prolonged n.p.o. status. He has an IJ central line in correct position per PCCM. - Appreciate nutrition and pharmacy recs - TPN per pharmacy - Will continue CBG monitoring q6h and sensitive SSI coverage - IV Multivitamin, thiamine, folic acid daily in TPN - Trace elements M/W/F due to national shortage  - TPN labs (including monitor magnesium, potassium, and phosphorus daily for at least 3 days)  #Acute on chronic anemia - Likely dilutional plus acute blood loss anemia, to be expected s/p abdominal surgery. Also iatrogenic from serial blood draws.  Patient has a history of macrocytic anemia of unknown origin that was worked up extensively at [facility]([place]. Nutritional markers were normal and he  even had a bone marrow biopsy that was unremarkable. No gross bleeding. NGT output is bilious. Smear here showed increased bands (>20%), no signs of hemolysis.  - Continue to monitor, CBC from this am is pending, I will follow up - Transfusion threshold Hgb <7  Hemoglobin  Date Value Ref Range Status  01/14/2014 7.2* 13.0 - 17.0 g/dL Final  01/13/2014 7.4* 13.0 - 17.0 g/dL Final  01/12/2014 7.9* 13.0 - 17.0 g/dL Final  01/11/2014 8.4* 13.0 - 17.0 g/dL Final  01/10/2014 10.0* 13.0 - 17.0 g/dL Final    #Acute kidney injury - Presented with a Cr of 8.62, from his last known baseline of 1.2. This likely represented a combination of prerenal azotemia and ATN. Cr downtrended with IVF resuscitation, stabilized at ~3.5 after his surgery, and is now trending down again on POD#5. Suspect resolving pre-renal azotemia from insensible losses during surgery/NPO +/- ATN. CK elevated at 2764 on 3/31 (it was 265 on admission), this could also be a resolving rhabdomyolysis. (AST also elevated which can suggest muscle injury.) Foley was discontinued yesterday and he is urinating normally. - Fluids per above  - Holding nephrotoxic meds  - Trend BMETs  - Trend CK  Creatinine, Ser  Date Value Ref Range Status  01/15/2014 2.35* 0.50 - 1.35 mg/dL Final  01/14/2014 2.88* 0.50 - 1.35 mg/dL Final  01/13/2014 3.31* 0.50 - 1.35 mg/dL Final  01/13/2014 3.34* 0.50 - 1.35 mg/dL Final  01/13/2014 3.43* 0.50 - 1.35 mg/dL Final    Total CK  Date Value Ref Range Status  01/15/2014 1046* 7 - 232 U/L Final  01/14/2014 1619* 7 - 232 U/L Final  01/13/2014 2257* 7 - 232 U/L Final  01/13/2014 2764* 7 - 232 U/L Final  01/08/2014 265* 7 - 232 U/L Final    #LFT abnormalities and resolving hyperbilirubinemia - Total bilirubin peaked at 7.3 on 3/26. Mostly direct bilirubinemia. LFTs were only mildly elevated at that time. Bili has trended down, but then AST/ALT rose and have now plateaued. This is likely secondary to hypoperfusion from  hypovolemia. Hepatitis panel negative. HIV NR. RUQ [place] with no obvious obstruction. - Continue to monitor  Hepatic Function Panel     Component Value Date/Time   PROT 6.9 01/15/2014 0310   ALBUMIN 2.0* 01/15/2014 0310   AST 135* 01/15/2014 0310   ALT 136* 01/15/2014 0310   ALKPHOS 75 01/15/2014 0310   BILITOT 1.7* 01/15/2014 0310   BILIDIR 1.3* 01/14/2014 0324   IBILI 0.9 01/14/2014 0324    #Elevated anion gap, resolving - Wnl today, felt secondary to uremia vs. starvation ketoacidosis. Serial lactates have been normal. - IVF as above  - Trend BMET    #Alcohol use - The patient drinks 1-3 craft beers/day x 5 days per week. His last drink was on St. Patrick's Day (March 17). Withdrawal unlikely at this point. CIWAs 0 x several days. - Stop CIWA checks - Thiamine, folate, MV via TPN  #Prophy - Heparin subq, SCDs  Dispo: Disposition is deferred at this time, awaiting improvement of current medical problems.  Anticipated discharge in approximately 2-3 day(s).   The patient does have a current PCP (Dionne Volanda Napoleon, MD) and does not need an [facility] hospital follow-up appointment after discharge.  The patient does not have transportation limitations that hinder transportation to clinic appointments.  .Services Needed at time of discharge: Y = Yes, Blank = No PT: No PT follow up;Supervision - Intermittent  OT: No OT follow up;Supervision - Intermittent (needs to follow up for outpt therapy s/p RTC repair)   RN:   Equipment: Rolling walker with 5" wheels   Other:      LOS: 8 days   Lesly Dukes, MD 01/15/2014, 8:43 AM  Lesly Dukes, MD  [email or web address] Pager # [phone] Office # [phone]

## 2014-01-15 NOTE — Progress Notes (Signed)
Internal Medicine Attending  Date: 01/15/2014  Patient name: Lonnie MaserCarl Rondeau Medical record number: [id number] Date of birth: June 27, 1951 Age: 63 y.o. Gender: male  I saw and evaluated the patient, and discussed his care on A.M rounds with housestaff.  I reviewed the resident's note by Dr. Claudell Kyleater and I agree with the resident's findings and plans as documented in her note.

## 2014-01-15 NOTE — Progress Notes (Signed)
Utilization review completed.  

## 2014-01-15 NOTE — Progress Notes (Signed)
Subjective: [name] reports doing well today. He reports sleeping well and that his pain is improving and is currently a 4/10 and was previously a 5/10. He is urinating well with no pain, burning, or urgency. He does endorse some nausea, but states that he does not feel nauseas to the point of vomiting. He also reports no dizziness, chest pain, sob. Patient continues to ambulate during the day with PT and OR and will continue working to ambulate further today.   Objective: Vital signs in last 24 hours: Filed Vitals:   01/15/14 0411 01/15/14 0700 01/15/14 0800 01/15/14 0805  BP:      Pulse:      Temp: 98.8 F (37.1 C)   98 F (36.7 C)  TempSrc: Oral   Oral  Resp: 20  16   Height:      Weight:  85.4 kg (188 lb 4.4 oz)    SpO2: 98%  97%    Weight change:   Intake/Output Summary (Last 24 hours) at 01/15/14 0823 Last data filed at 01/15/14 0700  Gross per 24 hour  Intake 3115.73 ml  Output   3755 ml  Net -639.27 ml   General appearance: Resting quietly in bed, Alert and Oriented x3 Eyes: conjunctivae/corneas clear. PERRL, EOM's intact. Fundi benign. Throat: lips, mucosa, and tongue normal; teeth and gums normal and MMM Neck: no adenopathy, no JVD, supple, symmetrical, trachea midline and thyroid not enlarged, symmetric, no tenderness/mass/nodules Lungs: clear to auscultation bilaterally Chest wall: no tenderness Heart: regular rate and rhythm, S1, S2 normal, no murmur, click, rub or gallop Abdomen: Active bowel sounds heard in all quadrants, soft, non-distended, tender to palpation on the right side of the abdomen, wound area is dry, clean, and not erythematous or inflammed Extremities: extremities normal, atraumatic, no cyanosis or edema Pulses: 2+ and symmetric Lab Results: @LABTEST2 @ Micro Results: Recent Results (from the past 240 hour(s))  SURGICAL PCR SCREEN     Status: Abnormal   Collection Time    01/09/14  4:48 AM      Result Value Ref Range Status   MRSA, PCR  NEGATIVE  NEGATIVE Final   Staphylococcus aureus POSITIVE (*) NEGATIVE Final   Comment:            The Xpert SA Assay (FDA     approved for NASAL specimens     in patients over 71 years of age),     is one component of     a comprehensive surveillance     program.  Test performance has     been validated by [employer] for patients greater     than or equal to 105 year old.     It is not intended     to diagnose infection nor to     guide or monitor treatment.  ANAEROBIC CULTURE     Status: None   Collection Time    01/09/14  1:46 PM      Result Value Ref Range Status   Specimen Description PERITONEAL FLUID   Final   Special Requests FLUID ON SWAB PT ON ZOSYN   Final   Gram Stain     Final   Value: RARE WBC PRESENT, PREDOMINANTLY MONONUCLEAR     NO SQUAMOUS EPITHELIAL CELLS SEEN     RARE GRAM POSITIVE COCCI     IN PAIRS     Performed at [facility]   Culture     Final   Value:  NO ANAEROBES ISOLATED     Note: Gram Stain Report Called to,Read Back By and Verified With: DORA GARDNER ON 01/09/2014 AT 11:27P BY WILEJ     Performed at [facility]   Report Status 01/14/2014 FINAL   Final  BODY FLUID CULTURE     Status: None   Collection Time    01/09/14  1:46 PM      Result Value Ref Range Status   Specimen Description PERITONEAL FLUID   Final   Special Requests FLUID ON SWAB PT ON ZOSYN   Final   Gram Stain     Final   Value: WBC PRESENT, PREDOMINANTLY MONONUCLEAR     GRAM POSITIVE COCCI     IN PAIRS     Performed at [facility]   Culture     Final   Value: RARE ESCHERICHIA COLI     MODERATE STREPTOCOCCUS GROUP C     Note: Gram Stain Report Called to,Read Back By and Verified With: DORA GARDNER ON 01/09/2014 AT 11:27P BY WILEJ     Performed at [facility]   Report Status 01/12/2014 FINAL   Final   Organism ID, Bacteria ESCHERICHIA COLI   Final   Organism ID, Bacteria STREPTOCOCCUS GROUP C   Final   Studies/Results: No results  found. Medications:  Scheduled: . antiseptic oral rinse  15 mL Mouth Rinse q12n4p  . chlorhexidine  15 mL Mouth Rinse BID  . HYDROmorphone PCA 0.3 mg/mL   Intravenous 6 times per day  . insulin aspart  0-9 Units Subcutaneous 4 times per day  . piperacillin-tazobactam (ZOSYN)  IV  3.375 g Intravenous Q8H   Continuous: . Marland KitchenTPN (CLINIMIX-E) Adult     And  . fat emulsion    . Marland KitchenTPN (CLINIMIX-E) Adult 83 mL/hr at 01/15/14 0900   And  . fat emulsion 250 mL (01/15/14 0900)   QIH:[id number], diphenhydrAMINE, morphine injection, naloxone, ondansetron, sodium chloride Scheduled Meds: . antiseptic oral rinse  15 mL Mouth Rinse q12n4p  . chlorhexidine  15 mL Mouth Rinse BID  . heparin  5,000 Units Subcutaneous 3 times per day  . HYDROmorphone PCA 0.3 mg/mL   Intravenous 6 times per day  . insulin aspart  0-9 Units Subcutaneous 4 times per day  . piperacillin-tazobactam (ZOSYN)  IV  3.375 g Intravenous Q8H   Continuous Infusions: . Marland KitchenTPN (CLINIMIX-E) Adult 83 mL/hr at 01/15/14 0600   And  . fat emulsion 500 kcal (01/15/14 0600)   PRN Meds:.diphenhydrAMINE, diphenhydrAMINE, morphine injection, naloxone, ondansetron, sodium chloride Assessment/Plan: [name] is a 63 yo man with a history of stroke and HTN that presented with abdominal pain, nausea, and vomiting. Found to have a small bowel obstruction and subsequently ruptured appendicitis on exploratory laparotomy.   Principal Problem:   Ruptured appendicitis Active Problems:   Small bowel obstruction   Acute kidney injury   High anion gap metabolic acidosis   Hyponatremia   Hyperbilirubinemia   Transaminitis  #Acute small bowel obstruction/peritonitis/ruptured appendicitis with intraabdominal abscess - Now POD#6 for exploratory laparotomy with ruptured appendicitis, SBO, lysis of adhesions. Improvement in pain today and bowel sounds heard. Still no flatus or bowel movement  - Continue Zosyn IV (Day 8 (4/2)) Will continue 10-14  days - Appreciate surgery recs  - Continue NPO, NG tube  - Continue PCA Dilaudid pump, zofran for symptomatic relief  - Continue IV Tylenol QID   - Incentive spirometry  - Mobilize OOB, PT eval and treat, OT eval and  treat   #Hypernatremia/hyperchloremia, resolved - Likely iatrogenic from aggressive IVF resuscitation combined with NGT losses and insensible losses from surgery. Steady trend down to normal overnight. We have transitioned fluids to maintenance rate. Most recent sodium level at 143 (4/2)  - Discontinue D5W at 117m/hr, now that he is tolerating TPN  - Discontinue 1/2 NS 70 ml/hr  - Monitor BMP BID   #Refeeding - Dietitian and pharmacy consulted for TPN given prolonged n.p.o. Status. He has an IJ central line in correct position per PCCM.  - Appreciate nutrition and pharmacy recs  - TPN per pharmacy  - Will continue CBG monitoring q6h and sensitive SSI coverage  - IV Multivitamin, thiamine, folic acid daily in TPN  - Trace elements M/W/F due to national shortage  - Discontinue TPN labs - stable for the past three days   #Acute on chronic anemia - Likely dilutional plus acute blood loss anemia, to be expected s/p abdominal surgery. Also iatrogenic from serial blood draws. Patient has a history of macrocytic anemia of unknown origin [facility]. Nutritional markers were normal and he even had a bone marrow biopsy that was unremarkable. No gross bleeding. NGT output is bilious. Smear here showed increased bands (>20%), no signs of hemolysis.  - Obtain peripheral smear - Obtain hemolysis panel, iron lab study, type and screen - Continue to monitor  - Transfusion threshold Hgb <7  Hemoglobin & Hematocrit     Component Value Date/Time   HGB 6.8* 01/15/2014 0815   HCT 19.3* 01/15/2014 0815   Hemoglobin & Hematocrit     Component  Value  Date/Time    HGB  7.2*  01/14/2014 0324    HCT  20.0*  01/14/2014 0324     #Acute kidney injury - Presented with a Cr of 8.62, from his last known baseline of 1.2. This likely represented a combination of prerenal azotemia and ATN. Cr downtrended with IVF resuscitation, now stabilized at ~3.5. Suspect continued pre-renal azotemia from insensible losses during surgery/NPO +/- ATN. CK elevated at 2764 (it was 265 on admission), suspect resolving rhabdomyolysis. AST also slightly elevated also which can suggest muscle injury.  - IVF per above  - Holding nephrotoxic meds  - Trend BMETs  - Trend CK  CK - 1046 (4/2) down from 1619  (4/1)   Serum Creatnine 2.88 (4/1) down from 3.31   #Hyperbilirubinemia - Bili labs below, slowly trending down. Mostly direct bilirubinemia. This is likely secondary to hypoperfusion from hypovolemia, but intrahepatic process could also be responsible. Hepatitis panel negative. HIV NR. RUQ [place] no obvious obstruction, but may need additional imaging such as MRCP vs ERCP if no resolution.  - Continue to monitor   Bilirubin    Component  Value  Date/Time    BILITOT  2.2*  01/14/2014 0324    BILIDIR  1.3*  01/14/2014 0324    IBILI  0.9  01/14/2014 0324    Bilirubin     Component Value Date/Time   BILITOT 1.7* 01/15/2014 0310   BILIDIR 1.3* 01/14/2014 0324   IBILI 0.9 01/14/2014 0324   #Elevated anion gap - resolved - AG 13 today, likely secondary to uremia vs. Starvation ketoacidosis. Lactate has been normal but will check again in am.  - D/C IVF - fluids per TPN - Trend BMET   #Prophy - Heparin subq, SCDs - Will discontinue heparin while transfusing blood and restart post transfusion  Dispo: Disposition is  deferred at this time, awaiting improvement of current medical problems. Anticipated discharge in approximately 2-3 day(s).  The patient does have a current PCP (Dionne Volanda Napoleon, MD) and does not need an [facility] hospital follow-up appointment after discharge.  The patient does not have transportation limitations that hinder  transportation to clinic appointments.   .Services Needed at time of discharge: Y = Yes, Blank = No  PT:  No PT follow up;Supervision - Intermittent   OT:    RN:    Equipment:  Rolling walker with 5" wheels   Other:       This is a Careers information officer Note.  The care of the patient was discussed with Dr. Lucila Maine and Dr. Marinda Elk and the assessment and plan formulated with their assistance.  Please see their attached note for official documentation of the daily encounter.   LOS: 8 days   Durene Fruits, Med Student 01/15/2014, 8:23 AM

## 2014-01-15 NOTE — Progress Notes (Signed)
  I have seen and examined the patient, and reviewed the daily progress note by Camille BalJimmy Chen, MS 3 and discussed the care of the patient with them. Please see my progress note from 01/15/2014 for further details regarding assessment and plan.    Signed:  Vivi BarrackSarah Wyllow Seigler, MD 01/15/2014, 12:57 PM

## 2014-01-15 NOTE — Progress Notes (Signed)
PARENTERAL NUTRITION CONSULT NOTE - Follow up   Pharmacy Consult for TPN Indication: Prolonged ileus  No Known Allergies  Patient Measurements: Height: 6' 2" (188 cm) Weight: 188 lb 4.4 oz (85.4 kg) IBW/kg (Calculated) : 82.2   Vital Signs: Temp: 98 F (36.7 C) (04/02 0805) Temp src: Oral (04/02 0805) BP: 134/78 mmHg (04/02 0356) Pulse Rate: 67 (04/02 0356) Intake/Output from previous day: 04/01 0701 - 04/02 0700 In: 3495.7 [I.V.:1491.3; IV Piggyback:100; TPN:1904.4] Out: 1410 [Urine:3910; Emesis/NG output:300; Drains:55] Intake/Output from this shift:    Labs:  Recent Labs  01/13/14 0400 01/14/14 0324 01/15/14 0815  WBC 9.5 8.3 8.1  HGB 7.4* 7.2* 6.8*  HCT 20.6* 20.0* 19.3*  PLT 268 278 352     Recent Labs  01/13/14 0101 01/13/14 0400  01/13/14 1800 01/14/14 0324 01/15/14 0310  NA 143 143  < > 141 138 143  K 3.9 3.8  < > 3.6* 3.9 3.9  CL 103 103  < > 101 100 105  CO2 25 25  < > _0 GLUCOSE 142* 141*  < > 139* 138* 130*  BUN 48* 50*  < > 55* 52* 42*  CREATININE 3.50* 3.43*  < > 3.31* 2.88* 2.35*  CALCIUM 7.9* 8.4  < > 8.5 8.3* 8.2*  MG  --  2.0  --   --  2.2 2.0  PHOS  --  4.1  --   --  4.2 3.4  PROT  --  6.9  --   --  6.9 6.9  ALBUMIN  --  1.9*  --   --  2.0* 2.0*  AST  --  140*  --   --  164* 135*  ALT  --  81*  --   --  125* 136*  ALKPHOS  --  57  --   --  61 75  BILITOT  --  2.7*  --   --  2.2* 1.7*  BILIDIR  --   --   --   --  1.3*  --   IBILI  --   --   --   --  0.9  --   PREALBUMIN  --  14.5*  --   --   --   --   TRIG  --  215*  --   --   --   --   < > = values in this interval not displayed. Estimated Creatinine Clearance: 38.4 ml/min (by C-G formula based on Cr of 2.35).    Recent Labs  01/14/14 1801 01/14/14 2337 01/15/14 0554  GLUCAP 122* 129* 133*    Medical History: Past Medical History  Diagnosis Date  . Hypertension     Dr. Sherrie [place] (PCP)  . Hypercholesterolemia   . Stroke 2011    at [facility] after sex   . SBO (small bowel obstruction) 01/07/2014    Medications:  Prescriptions prior to admission  Medication Sig Dispense Refill  . co-enzyme Q-10 30 MG capsule Take 30 mg by mouth daily.      Marland Kitchen lisinopril (PRINIVIL,ZESTRIL) 20 MG tablet Take 30-40 mg by mouth daily.      . Multiple Vitamin (MULTIVITAMIN WITH MINERALS) TABS tablet Take 1 tablet by mouth daily.      . naproxen sodium (ANAPROX) 220 MG tablet Take 440 mg by mouth daily as needed (for pain).      . Omega-3 Fatty Acids (FISH OIL) 1000 MG CAPS Take 1,000 mg by mouth daily.      Marland Kitchen  omeprazole (PRILOSEC) 20 MG capsule Take 20 mg by mouth daily.      . simvastatin (ZOCOR) 20 MG tablet Take 20 mg by mouth daily at 6 PM.        Insulin Requirements in the past 24 hours:  3 units of Novolog insulin   Current Nutrition:  NPO, Clinimix E 5/15 at 83 ml/hr + Lipids 20% at 10 mL/hr- provides 100g protein and 1894 kcal daily. (Dextrose at high rate was stopped 4/1 PM)  Nutritional Goals:  2200 - 2400 Kcal, 105-120 g Protein, 2.2-2.4 Liters per RD recommendations 4/1  Admit: Admitted with SBO, failed conservative measures and taken to OR 3/27  GI: SBO this admit- no flatus or BM yet, but does feel bowels moving (last BM 3/24). S/p ex-lap on 3/27 for SBO and found to have perforated appy and intra abd abscess, s/p appendectomy.  Remains NPO. NG tube output 300cc in last 24 hours. Pt meets criteria for severe malnutrition in the context of acute illness and is at risk for refeeding syndrome- has been doing well so far with TPN initiation. Plan is continue bowel rest. Prealbumin 14.5 at baseline 3/31  Endo: No hx. CBGs 113-130 in last 24 hours- was on D5 at 100 ml/hr for hypernatremia, now resolved and D5 was stopped yesterday afternoon. Elevated AG 18. Likely secondary to uremia vs. Starvation ketoacidosis   Lytes: Na 143, K 3.9, corrected Ca 9.9. Mg 2, Phos 3.4  Renal: ARI on admit. Likely combination of prerenal azotemia and ACE-I induced  ATN vs. NSAID use. SCr improved to 2.35, est CrCl ~6m/min. UOP 1.6 ml/kg/hr.   Pulm: RA  Cards: Hx HTN/DL. BP ok, HR wnl. On no CV meds  Hepatobil: AST/ALT remain elevated at 135/136. Albumin remains low at 2. Tbili 1.7 - continues to trend down, alk phos normal. Trigs 215  Neuro: Hx CVA. No secondary stroke px -- f/u ASA (sticky note left on 3/27 and has not yet been addressed). Started on CIWA protocol on 3/28.  ID: Zosyn D#8 for SBO and intra-abd infection. Afebrile, WBC 8.1  Best Practices: SQ hep, SCDs  TPN Access: Triple lumen CVC 3/29  TPN day#: 3 (started 3/30)  Plan:  1. Continue Clinimix E 5/15 at 83 ml/hr + Lipids 20% at 10 mL/hr; Provides 1894 kcal (~86% goal); 100 gm protein (~96% goal) 2. Will continue CBG monitoring q6h and sensitive SSI coverage  3. Daily IV MVI, thiamine 1449QPand folic acid 185min TPN 4. Trace elements M/W/F only due to national shortage 5. BMET, mag and phos tomorrow morning  Lysette Lindenbaum D. Jeremaine Maraj, PharmD, BCPS Clinical Pharmacist Pager: [phone]/11/2013 9:50 AM

## 2014-01-15 NOTE — Progress Notes (Addendum)
CBC    Component Value Date/Time   WBC 8.1 01/15/2014 0815   RBC 2.01* 01/15/2014 0815   HGB 6.8* 01/15/2014 0815   HCT 19.3* 01/15/2014 0815   PLT 352 01/15/2014 0815   MCV 96.0 01/15/2014 0815   MCH 33.8 01/15/2014 0815   MCHC 35.2 01/15/2014 0815   RDW 13.2 01/15/2014 0815   LYMPHSABS 1.0 01/13/2014 0400   MONOABS 0.4 01/13/2014 0400   EOSABS 0.3 01/13/2014 0400   BASOSABS 0.0 01/13/2014 0400    Hemoglobin is 6.8 today. Will type and screen, transfuse 1 unit. Patient is amenable. Follow up post-transfusion CBC. This is likely multifactorial from dilution, phlebotomy, acute blood loss s/p surgery. No signs of gross bleeding. However, to be on the safe side I am also checking anemia and hemolysis labs to rule out other causes.  Will also hold subq heparin and place SCDs for DVT ppx until Hgb stable.  Vivi BarrackSarah Fenris Cauble, MD  [email or web address] Pager # [phone] Office # [phone]

## 2014-01-15 NOTE — Progress Notes (Signed)
Physical Therapy Treatment Patient Details Name: Kathaleen MaserCarl Hewitt MRN: [id number] DOB: August 29, 1951 Today's Date: 01/15/2014    History of Present Illness Adm 3/25 and underwent Exploratory laparotomy, LOA, appendectomy and drainage of abscess(01/09/14)      PT Comments    Pt progressing well with mobility today. Pt is highly motivated to perform thera ex and increase mobility. Pt at supervision to min guard level for mobility at this time. Will cont to follow acutely to assess need for AD; pt is hopeful not to have to ambulate with RW upon D/C. Would benefit from high level balance activities next session.   Follow Up Recommendations  No PT follow up;Supervision - Intermittent     Equipment Recommendations  Rolling walker with 5" wheels    Recommendations for Other Services       Precautions / Restrictions Precautions Precautions: None Precaution Comments: NG tube; multiple lines; abdominal drain Required Braces or Orthoses: Other Brace/Splint Other Brace/Splint: abd binder Restrictions Weight Bearing Restrictions: No    Mobility  Bed Mobility Overal bed mobility: Modified Independent             General bed mobility comments: incr time but pt able to get to EOB at mod I level   Transfers Overall transfer level: Needs assistance Equipment used: Rolling walker (2 wheeled) Transfers: Sit to/from Stand Sit to Stand: Supervision         General transfer comment: Supervision for cues for safety and sequencing on RW  Ambulation/Gait Ambulation/Gait assistance: Supervision Ambulation Distance (Feet): 180 Feet Assistive device: Rolling walker (2 wheeled) Gait Pattern/deviations: Step-through pattern;Trunk flexed Gait velocity: guarded due to pain    General Gait Details: pt with flexed posture; cues for upright posture and proper gt sequencing; min guard to steady; no LOB    Stairs            Wheelchair Mobility    Modified Rankin (Stroke Patients Only)        Balance Overall balance assessment: No apparent balance deficits (not formally assessed)                                  Cognition Arousal/Alertness: Awake/alert Behavior During Therapy: WFL for tasks assessed/performed Overall Cognitive Status: Within Functional Limits for tasks assessed                      Exercises General Exercises - Lower Extremity Ankle Circles/Pumps: AROM;Both;10 reps;Supine Long Arc Quad: AROM;Strengthening;Both;10 reps;Seated Hip Flexion/Marching: AROM;Strengthening;Both;10 reps;Seated Other Exercises Other Exercises: toe raises x 10 Other Exercises: R ER x 15 reps with hold x 10 sec at end range Other Exercises: shoulder rolls    General Comments General comments (skin integrity, edema, etc.): continues to c/o sore buttocks      Pertinent Vitals/Pain 3/10; PCA PRN for pain    Home Living                      Prior Function            PT Goals (current goals can now be found in the care plan section) Acute Rehab PT Goals Patient Stated Goal: to get back to the gym PT Goal Formulation: With patient Time For Goal Achievement: 01/20/14 Potential to Achieve Goals: Good Progress towards PT goals: Progressing toward goals    Frequency  Min 3X/week    PT Plan Current plan remains appropriate  Co-evaluation             End of Session Equipment Utilized During Treatment: Gait belt Activity Tolerance: Patient tolerated treatment well Patient left: in chair;with call bell/phone within reach     Time: 1536-1600 PT Time Calculation (min): 24 min  Charges:  $Gait Training: 8-22 mins $Therapeutic Exercise: 8-22 mins                    G CodesDonell Sievert, [place] [phone] 01/15/2014, 4:51 PM

## 2014-01-15 NOTE — Progress Notes (Signed)
Patient ID: Lonnie Henry, male   DOB: August 09, 1951, 63 y.o.   MRN: [id number]   Subjective: No flatus yet, has increased his walking.  No n/v.  336m/24 NGT.  Hgb down to 6.8 today.  VSS.  Denies sob, cp or fatigue.    Objective:  Vital signs:  Filed Vitals:   01/15/14 0700 01/15/14 0720 01/15/14 0800 01/15/14 0805  BP:  133/68    Pulse:  73    Temp:    98 F (36.7 C)  TempSrc:    Oral  Resp:  22 16   Height:      Weight: 188 lb 4.4 oz (85.4 kg)     SpO2:  98% 97%     Last BM Date: 01/06/14  Intake/Output   Yesterday:  04/01 0701 - 04/02 0700 In: 3495.7 [I.V.:1491.3; IV Piggyback:100; TPN:1904.4] Out: 43474[Urine:3910; Emesis/NG output:300; Drains:55] This shift:  Total I/O In: 186 [TPN:186] Out: 140 [Emesis/NG output:140]   Physical Exam: General: Pt awake/alert/oriented x3 in no acute distress Abdomen: Soft.  Nondistended.  Appropriately tender. Midline incision is c/d/i.  RLQ drain with serosanguinous output.   No evidence of peritonitis.  No incarcerated hernias.  Problem List:   Principal Problem:   Ruptured appendicitis Active Problems:   Small bowel obstruction   Acute kidney injury   High anion gap metabolic acidosis   Hyponatremia   Hyperbilirubinemia   Transaminitis    Results:   Labs: Results for orders placed during the hospital encounter of 01/07/14 (from the past 48 hour(s))  BASIC METABOLIC PANEL     Status: Abnormal   Collection Time    01/13/14 10:45 AM      Result Value Ref Range   Sodium 142  137 - 147 mEq/L   Potassium 3.8  3.7 - 5.3 mEq/L   Chloride 102  96 - 112 mEq/L   CO2 23  19 - 32 mEq/L   Glucose, Bld 160 (*) 70 - 99 mg/dL   BUN 51 (*) 6 - 23 mg/dL   Creatinine, Ser 3.34 (*) 0.50 - 1.35 mg/dL   Calcium 8.6  8.4 - 10.5 mg/dL   GFR calc non Af Amer 18 (*) >90 mL/min   GFR calc Af Amer 21 (*) >90 mL/min   Comment: (NOTE)     The eGFR has been calculated using the CKD EPI equation.     This calculation has not been validated  in all clinical situations.     eGFR's persistently <90 mL/min signify possible Chronic Kidney     Disease.  GLUCOSE, CAPILLARY     Status: Abnormal   Collection Time    01/13/14 11:58 AM      Result Value Ref Range   Glucose-Capillary 143 (*) 70 - 99 mg/dL   Comment 1 Documented in Chart     Comment 2 Notify RN    GLUCOSE, CAPILLARY     Status: Abnormal   Collection Time    01/13/14  5:20 PM      Result Value Ref Range   Glucose-Capillary 145 (*) 70 - 99 mg/dL   Comment 1 Documented in Chart     Comment 2 Notify RN    BASIC METABOLIC PANEL     Status: Abnormal   Collection Time    01/13/14  6:00 PM      Result Value Ref Range   Sodium 141  137 - 147 mEq/L   Potassium 3.6 (*) 3.7 - 5.3 mEq/L   Chloride 101  96 -  112 mEq/L   CO2 24  19 - 32 mEq/L   Glucose, Bld 139 (*) 70 - 99 mg/dL   BUN 55 (*) 6 - 23 mg/dL   Creatinine, Ser 3.31 (*) 0.50 - 1.35 mg/dL   Calcium 8.5  8.4 - 10.5 mg/dL   GFR calc non Af Amer 19 (*) >90 mL/min   GFR calc Af Amer 22 (*) >90 mL/min   Comment: (NOTE)     The eGFR has been calculated using the CKD EPI equation.     This calculation has not been validated in all clinical situations.     eGFR's persistently <90 mL/min signify possible Chronic Kidney     Disease.  CK     Status: Abnormal   Collection Time    01/13/14  6:00 PM      Result Value Ref Range   Total CK 2257 (*) 7 - 232 U/L  GLUCOSE, CAPILLARY     Status: Abnormal   Collection Time    01/13/14 11:41 PM      Result Value Ref Range   Glucose-Capillary 129 (*) 70 - 99 mg/dL   Comment 1 Notify RN     Comment 2 Documented in Chart    MAGNESIUM     Status: None   Collection Time    01/14/14  3:24 AM      Result Value Ref Range   Magnesium 2.2  1.5 - 2.5 mg/dL  PHOSPHORUS     Status: None   Collection Time    01/14/14  3:24 AM      Result Value Ref Range   Phosphorus 4.2  2.3 - 4.6 mg/dL  CBC     Status: Abnormal   Collection Time    01/14/14  3:24 AM      Result Value Ref Range    WBC 8.3  4.0 - 10.5 K/uL   RBC 2.09 (*) 4.22 - 5.81 MIL/uL   Hemoglobin 7.2 (*) 13.0 - 17.0 g/dL   HCT 20.0 (*) 39.0 - 52.0 %   MCV 95.7  78.0 - 100.0 fL   MCH 34.4 (*) 26.0 - 34.0 pg   MCHC 36.0  30.0 - 36.0 g/dL   RDW 13.3  11.5 - 15.5 %   Platelets 278  150 - 400 K/uL  BASIC METABOLIC PANEL     Status: Abnormal   Collection Time    01/14/14  3:24 AM      Result Value Ref Range   Sodium 138  137 - 147 mEq/L   Potassium 3.9  3.7 - 5.3 mEq/L   Chloride 100  96 - 112 mEq/L   CO2 23  19 - 32 mEq/L   Glucose, Bld 138 (*) 70 - 99 mg/dL   BUN 52 (*) 6 - 23 mg/dL   Creatinine, Ser 2.88 (*) 0.50 - 1.35 mg/dL   Calcium 8.3 (*) 8.4 - 10.5 mg/dL   GFR calc non Af Amer 22 (*) >90 mL/min   GFR calc Af Amer 26 (*) >90 mL/min   Comment: (NOTE)     The eGFR has been calculated using the CKD EPI equation.     This calculation has not been validated in all clinical situations.     eGFR's persistently <90 mL/min signify possible Chronic Kidney     Disease.  CK     Status: Abnormal   Collection Time    01/14/14  3:24 AM      Result Value Ref Range   Total  CK 1619 (*) 7 - 232 U/L  HEPATIC FUNCTION PANEL     Status: Abnormal   Collection Time    01/14/14  3:24 AM      Result Value Ref Range   Total Protein 6.9  6.0 - 8.3 g/dL   Albumin 2.0 (*) 3.5 - 5.2 g/dL   AST 164 (*) 0 - 37 U/L   ALT 125 (*) 0 - 53 U/L   Alkaline Phosphatase 61  39 - 117 U/L   Total Bilirubin 2.2 (*) 0.3 - 1.2 mg/dL   Bilirubin, Direct 1.3 (*) 0.0 - 0.3 mg/dL   Indirect Bilirubin 0.9  0.3 - 0.9 mg/dL  LACTIC ACID, PLASMA     Status: None   Collection Time    01/14/14  5:30 AM      Result Value Ref Range   Lactic Acid, Venous 1.1  0.5 - 2.2 mmol/L  GLUCOSE, CAPILLARY     Status: Abnormal   Collection Time    01/14/14  5:44 AM      Result Value Ref Range   Glucose-Capillary 123 (*) 70 - 99 mg/dL  GLUCOSE, CAPILLARY     Status: Abnormal   Collection Time    01/14/14 11:50 AM      Result Value Ref Range    Glucose-Capillary 113 (*) 70 - 99 mg/dL   Comment 1 Documented in Chart     Comment 2 Notify RN    GLUCOSE, CAPILLARY     Status: Abnormal   Collection Time    01/14/14  6:01 PM      Result Value Ref Range   Glucose-Capillary 122 (*) 70 - 99 mg/dL   Comment 1 Notify RN     Comment 2 Documented in Chart    GLUCOSE, CAPILLARY     Status: Abnormal   Collection Time    01/14/14 11:37 PM      Result Value Ref Range   Glucose-Capillary 129 (*) 70 - 99 mg/dL  COMPREHENSIVE METABOLIC PANEL     Status: Abnormal   Collection Time    01/15/14  3:10 AM      Result Value Ref Range   Sodium 143  137 - 147 mEq/L   Potassium 3.9  3.7 - 5.3 mEq/L   Chloride 105  96 - 112 mEq/L   CO2 25  19 - 32 mEq/L   Glucose, Bld 130 (*) 70 - 99 mg/dL   BUN 42 (*) 6 - 23 mg/dL   Creatinine, Ser 2.35 (*) 0.50 - 1.35 mg/dL   Calcium 8.2 (*) 8.4 - 10.5 mg/dL   Total Protein 6.9  6.0 - 8.3 g/dL   Albumin 2.0 (*) 3.5 - 5.2 g/dL   AST 135 (*) 0 - 37 U/L   ALT 136 (*) 0 - 53 U/L   Alkaline Phosphatase 75  39 - 117 U/L   Total Bilirubin 1.7 (*) 0.3 - 1.2 mg/dL   GFR calc non Af Amer 28 (*) >90 mL/min   GFR calc Af Amer 33 (*) >90 mL/min   Comment: (NOTE)     The eGFR has been calculated using the CKD EPI equation.     This calculation has not been validated in all clinical situations.     eGFR's persistently <90 mL/min signify possible Chronic Kidney     Disease.  MAGNESIUM     Status: None   Collection Time    01/15/14  3:10 AM      Result Value Ref Range  Magnesium 2.0  1.5 - 2.5 mg/dL  PHOSPHORUS     Status: None   Collection Time    01/15/14  3:10 AM      Result Value Ref Range   Phosphorus 3.4  2.3 - 4.6 mg/dL  CK     Status: Abnormal   Collection Time    01/15/14  3:10 AM      Result Value Ref Range   Total CK 1046 (*) 7 - 232 U/L  GLUCOSE, CAPILLARY     Status: Abnormal   Collection Time    01/15/14  5:54 AM      Result Value Ref Range   Glucose-Capillary 133 (*) 70 - 99 mg/dL   Comment 1  Notify RN     Comment 2 Documented in Chart    CBC     Status: Abnormal   Collection Time    01/15/14  8:15 AM      Result Value Ref Range   WBC 8.1  4.0 - 10.5 K/uL   RBC 2.01 (*) 4.22 - 5.81 MIL/uL   Hemoglobin 6.8 (*) 13.0 - 17.0 g/dL   Comment: SPECIMEN CHECKED FOR CLOTS     REPEATED TO VERIFY     CRITICAL RESULT CALLED TO, READ BACK BY AND VERIFIED WITH:     WOFFORD,R RN 0160 01/15/2014 BROWNM   HCT 19.3 (*) 39.0 - 52.0 %   MCV 96.0  78.0 - 100.0 fL   MCH 33.8  26.0 - 34.0 pg   MCHC 35.2  30.0 - 36.0 g/dL   RDW 13.2  11.5 - 15.5 %   Platelets 352  150 - 400 K/uL    Imaging / Studies: No results found.  Medications / Allergies: per chart  Antibiotics: Anti-infectives   Start     Dose/Rate Route Frequency Ordered Stop   01/09/14 1315  piperacillin-tazobactam (ZOSYN) IVPB 3.375 g     3.375 g 100 mL/hr over 30 Minutes Intravenous STAT 01/09/14 1300 01/09/14 1733   01/08/14 2300  piperacillin-tazobactam (ZOSYN) IVPB 3.375 g     3.375 g 12.5 mL/hr over 240 Minutes Intravenous Every 8 hours 01/08/14 1447     01/08/14 1500  piperacillin-tazobactam (ZOSYN) IVPB 3.375 g     3.375 g 100 mL/hr over 30 Minutes Intravenous  Once 01/08/14 1447 01/08/14 1540      Assessment/Plan  SBO, found to have perforated appendicitis, intra-abdominal abscess  Exploratory laparotomy, LOA, appendectomy and drainage of abscess, Dr.Burke [place] Silos 01/09/14.  Post op ileus  Acute renal failure  Abnormal LFTs  Hypertension  Hx of Stroke  dyslipidemia  Anemia  PCM/TNA  Plan: -NGT, await bowel function -TNA -mobilize -IS -drain care -agree with transfusion   Erby Pian, [facility] Surgery Pager [phone] Office [phone]  01/15/2014 10:39 AM

## 2014-01-16 ENCOUNTER — Inpatient Hospital Stay (HOSPITAL_COMMUNITY): Payer: BC Managed Care – PPO

## 2014-01-16 DIAGNOSIS — D649 Anemia, unspecified: Secondary | ICD-10-CM | POA: Diagnosis present

## 2014-01-16 LAB — CBC
HEMATOCRIT: 21.3 % — AB (ref 39.0–52.0)
HEMOGLOBIN: 7.5 g/dL — AB (ref 13.0–17.0)
MCH: 33.3 pg (ref 26.0–34.0)
MCHC: 35.2 g/dL (ref 30.0–36.0)
MCV: 94.7 fL (ref 78.0–100.0)
Platelets: 403 10*3/uL — ABNORMAL HIGH (ref 150–400)
RBC: 2.25 MIL/uL — ABNORMAL LOW (ref 4.22–5.81)
RDW: 15.3 % (ref 11.5–15.5)
WBC: 8.4 10*3/uL (ref 4.0–10.5)

## 2014-01-16 LAB — CK: CK TOTAL: 679 U/L — AB (ref 7–232)

## 2014-01-16 LAB — HEPATIC FUNCTION PANEL
ALBUMIN: 2.1 g/dL — AB (ref 3.5–5.2)
ALT: 159 U/L — ABNORMAL HIGH (ref 0–53)
AST: 138 U/L — AB (ref 0–37)
Alkaline Phosphatase: 88 U/L (ref 39–117)
BILIRUBIN TOTAL: 1.8 mg/dL — AB (ref 0.3–1.2)
Bilirubin, Direct: 0.9 mg/dL — ABNORMAL HIGH (ref 0.0–0.3)
Indirect Bilirubin: 0.9 mg/dL (ref 0.3–0.9)
TOTAL PROTEIN: 7.1 g/dL (ref 6.0–8.3)

## 2014-01-16 LAB — BASIC METABOLIC PANEL
BUN: 38 mg/dL — AB (ref 6–23)
CHLORIDE: 107 meq/L (ref 96–112)
CO2: 24 mEq/L (ref 19–32)
Calcium: 8.5 mg/dL (ref 8.4–10.5)
Creatinine, Ser: 1.92 mg/dL — ABNORMAL HIGH (ref 0.50–1.35)
GFR calc Af Amer: 42 mL/min — ABNORMAL LOW (ref >90)
GFR calc non Af Amer: 36 mL/min — ABNORMAL LOW (ref >90)
Glucose, Bld: 124 mg/dL — ABNORMAL HIGH (ref 70–99)
POTASSIUM: 4.3 meq/L (ref 3.7–5.3)
Sodium: 143 mEq/L (ref 137–147)

## 2014-01-16 LAB — TYPE AND SCREEN
ABO/RH(D): A POS
ANTIBODY SCREEN: NEGATIVE
Unit division: 0

## 2014-01-16 LAB — GLUCOSE, CAPILLARY
GLUCOSE-CAPILLARY: 123 mg/dL — AB (ref 70–99)
Glucose-Capillary: 127 mg/dL — ABNORMAL HIGH (ref 70–99)
Glucose-Capillary: 143 mg/dL — ABNORMAL HIGH (ref 70–99)

## 2014-01-16 LAB — MAGNESIUM: Magnesium: 1.9 mg/dL (ref 1.5–2.5)

## 2014-01-16 LAB — PHOSPHORUS: PHOSPHORUS: 3.2 mg/dL (ref 2.3–4.6)

## 2014-01-16 MED ORDER — HYDROMORPHONE HCL PF 1 MG/ML IJ SOLN
0.5000 mg | INTRAMUSCULAR | Status: DC | PRN
  Administered 2014-01-19: 1 mg via INTRAVENOUS
  Filled 2014-01-16: qty 1

## 2014-01-16 MED ORDER — FAT EMULSION 20 % IV EMUL
250.0000 mL | INTRAVENOUS | Status: AC
  Administered 2014-01-16: 250 mL via INTRAVENOUS
  Filled 2014-01-16: qty 250

## 2014-01-16 MED ORDER — ACETAMINOPHEN 10 MG/ML IV SOLN
1000.0000 mg | Freq: Four times a day (QID) | INTRAVENOUS | Status: AC
  Administered 2014-01-16 – 2014-01-17 (×4): 1000 mg via INTRAVENOUS
  Filled 2014-01-16 (×6): qty 100

## 2014-01-16 MED ORDER — TRACE MINERALS CR-CU-F-FE-I-MN-MO-SE-ZN IV SOLN
INTRAVENOUS | Status: AC
  Administered 2014-01-16: 18:00:00 via INTRAVENOUS
  Filled 2014-01-16: qty 2000

## 2014-01-16 NOTE — Progress Notes (Signed)
NGT advanced after seeing films. Has some BS now and feels like he may pass some gas. Films show some improvement. Patient examined and I agree with the assessment and plan  Violeta GelinasBurke Joevanni Roddey, MD, MPH, FACS Trauma: [phone] General Surgery: [phone]  01/16/2014 1:33 PM

## 2014-01-16 NOTE — Progress Notes (Signed)
7 Days Post-Op  Subjective: He was up walking with PT, HR up to 118 with going from BR to bedside,  He was not able to void and had just walked.    Objective: Vital signs in last 24 hours: Temp:  [97.8 F (36.6 C)-99.2 F (37.3 C)] 98 F (36.7 C) (04/03 0800) Pulse Rate:  [69-88] 70 (04/03 0324) Resp:  [12-24] 17 (04/03 0800) BP: (114-131)/(71-80) 118/72 mmHg (04/03 0324) SpO2:  [95 %-100 %] 97 % (04/03 0800) Weight:  [87 kg (191 lb 12.8 oz)] 87 kg (191 lb 12.8 oz) (04/03 0327) Last BM Date: 01/06/14 215 from the NG. On suction NPO/TNA Afebrile, VSS Creatinine continues to improve Transfused and H/H 6.8 to 8.3 to 7.5 today (over last 3 days) Intake/Output from previous day: 04/02 0701 - 04/03 0700 In: 2452 [Blood:263; IV Piggyback:50; EXB:2841]TPN:2139] Out: 2577.5 [Urine:2325; Emesis/NG output:215; Drains:37.5] Intake/Output this shift: Total I/O In: 93 [TPN:93] Out: -   General appearance: alert, cooperative and walking in hall with PT and clearly tired from experience. Resp: clear to auscultation bilaterally and He has a binder in place so it's really hard to hear Bases. GI: Up in chair now, no distension, few BS today, not much from the NG.  No flatus or BM  Lab Results:   Recent Labs  01/15/14 1940 01/16/14 0335  WBC 9.4 8.4  HGB 8.3* 7.5*  HCT 24.0* 21.3*  PLT 417* 403*    BMET  Recent Labs  01/15/14 0310 01/16/14 0335  NA 143 143  K 3.9 4.3  CL 105 107  CO2 25 24  GLUCOSE 130* 124*  BUN 42* 38*  CREATININE 2.35* 1.92*  CALCIUM 8.2* 8.5   PT/INR No results found for this basename: LABPROT, INR,  in the last 72 hours   Recent Labs Lab 01/12/14 0300 01/13/14 0400 01/14/14 0324 01/15/14 0310 01/16/14 0335  AST 130* 140* 164* 135* 138*  ALT 76* 81* 125* 136* 159*  ALKPHOS 59 57 61 75 88  BILITOT 3.6* 2.7* 2.2* 1.7* 1.8*  PROT 7.1 6.9 6.9 6.9 7.1  ALBUMIN 2.0* 1.9* 2.0* 2.0* 2.1*     Lipase     Component Value Date/Time   LIPASE 24 01/06/2014  2345     Studies/Results: No results found.  Medications: . antiseptic oral rinse  15 mL Mouth Rinse q12n4p  . chlorhexidine  15 mL Mouth Rinse BID  . HYDROmorphone PCA 0.3 mg/mL   Intravenous 6 times per day  . insulin aspart  0-9 Units Subcutaneous 4 times per day  . piperacillin-tazobactam (ZOSYN)  IV  3.375 g Intravenous Q8H    Assessment/Plan Admitted with SBO, at surgery found to have Perforated appendix, Intraabdominal abscess and SBO  Exploratory laparotomy, LOA, appendectomy and drainage of abscess, Dr.[name] 01/09/14.  Post op ileus  Acute renal failure  Acute liver injury  Hypertension  Hx of Stroke  dyslipidemia  Anemia  PCM/TNA Deconditioning    Plan:  Im going to try clamping his NG and get a film it may not even be in.  He has orders to ambulate. I am going to stop the PCA and give him IV tylenol, prn dilaudid.  Continue TNA.    LOS: 9 days    Tonni Mansour 01/16/2014

## 2014-01-16 NOTE — Progress Notes (Signed)
Subjective: Patient seen and examined this morning at the bedside. His pain is improving. He can hear and feel his bowels moving, and has the urge to pass gas, but still no gas or BM. He has been working with PT/OT and feels stronger every day.  Objective: Vital signs in last 24 hours: Filed Vitals:   01/16/14 0700 01/16/14 0800 01/16/14 0811 01/16/14 1118  BP:   129/82   Pulse:   75   Temp: 98.4 F (36.9 C) 98 F (36.7 C)  97.9 F (36.6 C)  TempSrc: Oral Oral  Oral  Resp:  17 16   Height:      Weight:      SpO2:  97% 98%    Weight change: -9 lb 4.2 oz (-4.2 kg)  Intake/Output Summary (Last 24 hours) at 01/16/14 1300 Last data filed at 01/16/14 1129  Gross per 24 hour  Intake   1987 ml  Output 2099.5 ml  Net -112.5 ml   Physical Exam General: alert, cooperative, appears more comfortable today. HEENT: PERRL, EOMI, oropharynx clear and non-erythematous, NG tube in place with green drainage, scleral jaundice. Neck: supple. Lungs: clear to ascultation bilaterally, normal work of respiration, no wheezes, rales, ronchi. Heart: regular rate and rhythm, no murmurs, gallops, or rubs. Abdomen: Abdominal binder in place. Bowel sounds heard in upper and lower quadrants today. Tenderness is improved, he can tolerate gentle palpation. Surgical incision is clean, dry, intact. JP drain in place to suction with sanguinous drainage. Extremities: no cyanosis, clubbing, or edema. Neurologic: alert & oriented X3. Moves all extremities voluntarily.  Lab Results: Basic Metabolic Panel:  Recent Labs Lab 01/15/14 0310 01/16/14 0335  NA 143 143  K 3.9 4.3  CL 105 107  CO2 25 24  GLUCOSE 130* 124*  BUN 42* 38*  CREATININE 2.35* 1.92*  CALCIUM 8.2* 8.5  MG 2.0 1.9  PHOS 3.4 3.2   Liver Function Tests:  Recent Labs Lab 01/15/14 0310 01/16/14 0335  AST 135* 138*  ALT 136* 159*  ALKPHOS 75 88  BILITOT 1.7* 1.8*  PROT 6.9 7.1  ALBUMIN 2.0* 2.1*   CBC:  Recent Labs Lab  01/13/14 0400  01/15/14 1940 01/16/14 0335  WBC 9.5  < > 9.4 8.4  NEUTROABS 7.8*  --   --   --   HGB 7.4*  < > 8.3* 7.5*  HCT 20.6*  < > 24.0* 21.3*  MCV 96.3  < > 94.5 94.7  PLT 268  < > 417* 403*  < > = values in this interval not displayed. Cardiac Enzymes:  Recent Labs Lab 01/14/14 0324 01/15/14 0310 01/16/14 0335  CKTOTAL 1619* 1046* 679*   CBG:  Recent Labs Lab 01/14/14 2337 01/15/14 0554 01/15/14 1206 01/15/14 1740 01/16/14 0012 01/16/14 0551  GLUCAP 129* 133* 127* 126* 127* 143*   Coagulation: No results found for this basename: LABPROT, INR,  in the last 168 hours  Micro Results: Recent Results (from the past 240 hour(s))  SURGICAL PCR SCREEN     Status: Abnormal   Collection Time    01/09/14  4:48 AM      Result Value Ref Range Status   MRSA, PCR NEGATIVE  NEGATIVE Final   Staphylococcus aureus POSITIVE (*) NEGATIVE Final   Comment:            The Xpert SA Assay (FDA     approved for NASAL specimens     in patients over 49 years of age),  is one component of     a comprehensive surveillance     program.  Test performance has     been validated by [facility] for patients greater     than or equal to 67 year old.     It is not intended     to diagnose infection nor to     guide or monitor treatment.  ANAEROBIC CULTURE     Status: None   Collection Time    01/09/14  1:46 PM      Result Value Ref Range Status   Specimen Description PERITONEAL FLUID   Final   Special Requests FLUID ON SWAB PT ON ZOSYN   Final   Gram Stain     Final   Value: RARE WBC PRESENT, PREDOMINANTLY MONONUCLEAR     NO SQUAMOUS EPITHELIAL CELLS SEEN     RARE GRAM POSITIVE COCCI     IN PAIRS     Performed at [facility]   Culture     Final   Value: NO ANAEROBES ISOLATED     Note: Gram Stain Report Called to,Read Back By and Verified With: DORA GARDNER ON 01/09/2014 AT 11:27P BY WILEJ     Performed at [facility]   Report Status 01/14/2014 FINAL    Final  BODY FLUID CULTURE     Status: None   Collection Time    01/09/14  1:46 PM      Result Value Ref Range Status   Specimen Description PERITONEAL FLUID   Final   Special Requests FLUID ON SWAB PT ON ZOSYN   Final   Gram Stain     Final   Value: WBC PRESENT, PREDOMINANTLY MONONUCLEAR     GRAM POSITIVE COCCI     IN PAIRS     Performed at [facility]   Culture     Final   Value: RARE ESCHERICHIA COLI     MODERATE STREPTOCOCCUS GROUP C     Note: Gram Stain Report Called to,Read Back By and Verified With: DORA GARDNER ON 01/09/2014 AT 11:27P BY WILEJ     Performed at [facility]   Report Status 01/12/2014 FINAL   Final   Organism ID, Bacteria ESCHERICHIA COLI   Final   Organism ID, Bacteria STREPTOCOCCUS GROUP C   Final   Studies/Results: Dg Abd Portable 1v  01/16/2014   CLINICAL DATA:  Postop ileus.  EXAM: PORTABLE ABDOMEN - 1 VIEW  COMPARISON:  01/09/2014  FINDINGS: Enteric tube is partially visualized with tip projecting over the gastric bubble. Skin staples project over the midline of the lower abdomen. Drain projects over the lower abdomen and pelvis. There is mild gaseous dilatation of multiple small bowel loops in the central and upper abdomen, measuring up to approximately 4.5 cm. The amount of small bowel dilatation has decreased from the prior study. No acute osseous abnormality is seen.  IMPRESSION: Interval improvement in small bowel obstruction following surgery. Residual small bowel dilatation could reflect postoperative ileus.   Electronically Signed   By: Logan Bores   On: 01/16/2014 10:38   Medications: I have reviewed the patient's current medications. Scheduled Meds: . acetaminophen  1,000 mg Intravenous 4 times per day  . antiseptic oral rinse  15 mL Mouth Rinse q12n4p  . chlorhexidine  15 mL Mouth Rinse BID  . insulin aspart  0-9 Units Subcutaneous 4 times per day  . piperacillin-tazobactam (ZOSYN)  IV  3.375 g Intravenous  Q8H   Continuous  Infusions: . Marland KitchenTPN (CLINIMIX-E) Adult 83 mL/hr at 01/16/14 0800   And  . fat emulsion 240 mL (01/16/14 0800)  . Marland KitchenTPN (CLINIMIX-E) Adult     And  . fat emulsion     PRN Meds:.HYDROmorphone (DILAUDID) injection, morphine injection, ondansetron  Assessment/Plan: The patient is a 63 yo man, presenting with abdominal pain, nausea, and vomiting, found to have a small bowel obstruction and subsequently ruptured appendicitis on ex-lap.  #Acute small bowel obstruction 2/2 ruptured appendicitis with intraabdominal abscess - Now POD#7 from exploratory laparotomy with ruptured appendicitis, SBO, lysis of adhesions. Improvement in pain today and bowel sounds heard. Still no flatus. Peritoneal fluid culture collected in the OR is growing rare E. Coli, moderate streptococcus group C. Anaerobic cultures are NGTD. Surgical pathology of the appendix shows acute supperative appendicitis. Repeat abdominal film today shows interval improvement in small bowel obstruction following surgery, residual small bowel dilatation could reflect postoperative ileus. - Appreciate surgery recs  - Continue NPO, NG tube - Stop PCA, pain control with Dilaudid 0.5-85m q2h prn, IV Tylenol QID, morphine 254mq4h prn severe pain (he is not requiring this) - Zofran q6hr prn nausea - Continue Zosyn IV q8h for intraabdominal infection (antibiotic day 9) - Incentive spirometry - Mobilize OOB, PT eval and treat, OT eval and treat - Continue abdominal binder  #Hypernatremia/hyperchloremia, resolved - Likely iatrogenic from aggressive IVF resuscitation combined with NGT losses and insensible losses from surgery. Steady trend down to normal after aggressive D5W infusion. - Stop IVF, continue volume resuscitation via TPN - I/Os > -3001cc since admission - Monitor BMP daily  Sodium  Date Value Ref Range Status  01/16/2014 143  137 - 147 mEq/L Final  01/15/2014 143  137 - 147 mEq/L Final  01/14/2014 138  137 - 147 mEq/L Final  01/13/2014 141   137 - 147 mEq/L Final  01/13/2014 142  137 - 147 mEq/L Final  01/13/2014 143  137 - 147 mEq/L Final  01/13/2014 143  137 - 147 mEq/L Final  01/12/2014 145  137 - 147 mEq/L Final    #Refeeding - Dietitian and pharmacy consulted for TPN given prolonged n.p.o. status. He has an IJ central line in correct position per PCCM. - Appreciate nutrition and pharmacy recs - TPN per pharmacy - Will continue CBG monitoring q6h and sensitive SSI coverage - IV Multivitamin, thiamine, folic acid daily in TPN - Trace elements M/W/F due to national shortage  - TPN labs (including monitor magnesium, potassium, and phosphorus daily for at least 3 days)  #Acute on chronic anemia - Likely dilutional plus acute blood loss anemia, to be expected s/p abdominal surgery. Also iatrogenic from serial blood draws.  Patient has a history of macrocytic anemia of unknown origin [facility] [place] Nutritional markers were normal and he even had a bone marrow biopsy that was unremarkable. No gross bleeding. NGT output is bilious. Smear 4/2 showed a target cells and slight polychromasia. Haptoglobin NOT low. LDH 324 (elevated). Iron panel below. Iron normal, ferritin increased (acute phase reactant). Retic % normal. - Continue to monitor - Transfusion threshold Hgb <7  Hemoglobin  Date Value Ref Range Status  01/16/2014 7.5* 13.0 - 17.0 g/dL Final  01/15/2014 8.3* 13.0 - 17.0 g/dL Final     REPEATED TO VERIFY     POST TRANSFUSION SPECIMEN  01/15/2014 6.8* 13.0 - 17.0 g/dL Final     SPECIMEN CHECKED FOR  CLOTS     REPEATED TO VERIFY     CRITICAL RESULT CALLED TO, READ BACK BY AND VERIFIED WITH:     WOFFORD,R RN [id number] 01/15/2014 BROWNM  01/14/2014 7.2* 13.0 - 17.0 g/dL Final  01/13/2014 7.4* 13.0 - 17.0 g/dL Final   Iron/TIBC/Ferritin    Component Value Date/Time   IRON 66 01/15/2014 1130   TIBC 211* 01/15/2014 1130   FERRITIN 937* 01/15/2014 1130    #Acute kidney injury -  Presented with a Cr of 8.62, from his last known baseline of 1.2. This likely represented a combination of prerenal azotemia and ATN. Cr downtrended with IVF resuscitation, stabilized at ~3.5 after his surgery, and is now trending down again since POD#5. Suspect resolving pre-renal azotemia from insensible losses during surgery/NPO +/- ATN. CK elevated at 2764 on 3/31 (it was 265 on admission), this could also be a resolving rhabdomyolysis. (AST also elevated which can suggest muscle injury.) Foley was discontinued and he is urinating normally. - Fluids per above  - Holding nephrotoxic meds  - Trend BMETs  - Trend CK  Creatinine, Ser  Date Value Ref Range Status  01/16/2014 1.92* 0.50 - 1.35 mg/dL Final  01/15/2014 2.35* 0.50 - 1.35 mg/dL Final  01/14/2014 2.88* 0.50 - 1.35 mg/dL Final  01/13/2014 3.31* 0.50 - 1.35 mg/dL Final  01/13/2014 3.34* 0.50 - 1.35 mg/dL Final    Total CK  Date Value Ref Range Status  01/16/2014 679* 7 - 232 U/L Final  01/15/2014 1046* 7 - 232 U/L Final  01/14/2014 1619* 7 - 232 U/L Final  01/13/2014 2257* 7 - 232 U/L Final  01/13/2014 2764* 7 - 232 U/L Final    #LFT abnormalities and resolving hyperbilirubinemia - Total bilirubin peaked at 7.3 on 3/26. Mostly direct bilirubinemia. LFTs were only mildly elevated at that time. Bili has trended down, but then AST/ALT rose and have now plateaued. This is likely secondary to hypoperfusion from hypovolemia. Hepatitis panel negative. HIV NR. RUQ [place] with no obvious obstruction. - Continue to monitor  Hepatic Function Panel     Component Value Date/Time   PROT 7.1 01/16/2014 0335   ALBUMIN 2.1* 01/16/2014 0335   AST 138* 01/16/2014 0335   ALT 159* 01/16/2014 0335   ALKPHOS 88 01/16/2014 0335   BILITOT 1.8* 01/16/2014 0335   BILIDIR 0.9* 01/16/2014 0335   IBILI 0.9 01/16/2014 0335    #Elevated anion gap, resolving - Wnl today, felt secondary to uremia vs. starvation ketoacidosis. Serial lactates have been normal. - IVF as above  - Trend  BMET    #Alcohol use - The patient drinks 1-3 craft beers/day x 5 days per week. His last drink was on St. Patrick's Day (March 17). Withdrawal unlikely at this point. CIWAs 0 x several days. - Stop CIWA checks - Thiamine, folate, MV via TPN  #Prophy - Heparin subq, SCDs  Dispo: Disposition is deferred at this time, awaiting improvement of current medical problems.  Anticipated discharge in approximately 2-3 day(s).   The patient does have a current PCP (Dionne Volanda Napoleon, MD) and does not need an [facility] hospital follow-up appointment after discharge.  The patient does not have transportation limitations that hinder transportation to clinic appointments.  .Services Needed at time of discharge: Y = Yes, Blank = No PT: No PT follow up;Supervision - Intermittent  OT: No OT follow up;Supervision - Intermittent (needs to follow up for outpt therapy s/p RTC repair)   RN:   Equipment: Rolling walker with 5" wheels  Other:      LOS: 9 days   Lesly Dukes, MD 01/16/2014, 1:00 PM  Lesly Dukes, MD  [email or web address] Pager # [phone] Office # [phone]
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
## 2014-01-16 NOTE — Progress Notes (Signed)
Physical Therapy Treatment Patient Details Name: Lonnie Henry MRN: [id number] DOB: March 24, 1951 Today's Date: 01/16/2014    History of Present Illness Adm 3/25 and underwent Exploratory laparotomy, LOA, appendectomy and drainage of abscess(01/09/14)      PT Comments    Pt continues to progress well with mobility and exercises. Pt highly motivated to return to PLOF. Encouraged to ambulate around unit as tolerated this weekend. Will plan to see for high level balance activities and stair management.   Follow Up Recommendations  No PT follow up;Supervision - Intermittent     Equipment Recommendations       Recommendations for Other Services       Precautions / Restrictions Precautions Precautions: None Precaution Comments: NG tube; multiple lines; abdominal drain Restrictions Weight Bearing Restrictions: No    Mobility  Bed Mobility Overal bed mobility: Modified Independent             General bed mobility comments: good technique; incr time due to lines and leads  Transfers Overall transfer level: Needs assistance Equipment used: Rolling walker (2 wheeled) Transfers: Sit to/from Stand Sit to Stand: Supervision;Modified independent (Device/Increase time)         General transfer comment: supervision to mod i for incr time and min cues for hand placement; able to use handicap rail in bathroom to rise to stand   Ambulation/Gait Ambulation/Gait assistance: Supervision Ambulation Distance (Feet): 300 Feet Assistive device: Rolling walker (2 wheeled) Gait Pattern/deviations: Step-through pattern;Decreased stride length;Trunk flexed Gait velocity: guarded due to pain  Gait velocity interpretation: Below normal speed for age/gender General Gait Details: cues for upright posture and incr step length; no dizziness or sway when challenged with higher level balance activities; no rest breaks required today; pt progressing well with mobility; will attempt steps and higher level  activities next session    Stairs            Wheelchair Mobility    Modified Rankin (Stroke Patients Only)       Balance Overall balance assessment: No apparent balance deficits (not formally assessed)                           High level balance activites: Head turns;Direction changes High Level Balance Comments: no LOB or sway noted     Cognition Arousal/Alertness: Awake/alert Behavior During Therapy: WFL for tasks assessed/performed Overall Cognitive Status: Within Functional Limits for tasks assessed                      Exercises General Exercises - Lower Extremity Ankle Circles/Pumps: AROM;Both;10 reps;Supine Long Arc Quad: AROM;Strengthening;Both;10 reps;Seated Hip ABduction/ADduction: AROM;Strengthening;Both;10 reps;Seated Hip Flexion/Marching: AROM;Strengthening;Both;10 reps;Seated    General Comments General comments (skin integrity, edema, etc.): pt reomved from suction by nurse today      Pertinent Vitals/Pain Using PCA PRN; stated pain is "better"     Home Living                      Prior Function            PT Goals (current goals can now be found in the care plan section) Acute Rehab PT Goals Patient Stated Goal: to be a gym rat PT Goal Formulation: With patient Time For Goal Achievement: 01/20/14 Potential to Achieve Goals: Good Progress towards PT goals: Progressing toward goals    Frequency  Min 3X/week    PT Plan Current plan remains appropriate  Co-evaluation             End of Session Equipment Utilized During Treatment: Gait belt Activity Tolerance: Patient tolerated treatment well Patient left: in chair;with call bell/phone within reach     Time: 0819-0847 PT Time Calculation (min): 28 min  Charges:  $Gait Training: 8-22 mins $Therapeutic Exercise: 8-22 mins                    G Codes:      Donnamarie Poag [place], [place] [phone] 01/16/2014, 9:25 AM

## 2014-01-17 LAB — HEPATIC FUNCTION PANEL
ALT: 184 U/L — ABNORMAL HIGH (ref 0–53)
AST: 121 U/L — ABNORMAL HIGH (ref 0–37)
Albumin: 2.3 g/dL — ABNORMAL LOW (ref 3.5–5.2)
Alkaline Phosphatase: 100 U/L (ref 39–117)
BILIRUBIN INDIRECT: 0.8 mg/dL (ref 0.3–0.9)
Bilirubin, Direct: 0.8 mg/dL — ABNORMAL HIGH (ref 0.0–0.3)
Total Bilirubin: 1.6 mg/dL — ABNORMAL HIGH (ref 0.3–1.2)
Total Protein: 7.5 g/dL (ref 6.0–8.3)

## 2014-01-17 LAB — BASIC METABOLIC PANEL
BUN: 38 mg/dL — AB (ref 6–23)
CALCIUM: 9.2 mg/dL (ref 8.4–10.5)
CO2: 25 mEq/L (ref 19–32)
CREATININE: 1.59 mg/dL — AB (ref 0.50–1.35)
Chloride: 108 mEq/L (ref 96–112)
GFR calc Af Amer: 52 mL/min — ABNORMAL LOW (ref >90)
GFR calc non Af Amer: 45 mL/min — ABNORMAL LOW (ref >90)
GLUCOSE: 129 mg/dL — AB (ref 70–99)
Potassium: 4.5 mEq/L (ref 3.7–5.3)
SODIUM: 147 meq/L (ref 137–147)

## 2014-01-17 LAB — MAGNESIUM: Magnesium: 1.9 mg/dL (ref 1.5–2.5)

## 2014-01-17 LAB — GLUCOSE, CAPILLARY
GLUCOSE-CAPILLARY: 114 mg/dL — AB (ref 70–99)
GLUCOSE-CAPILLARY: 122 mg/dL — AB (ref 70–99)
GLUCOSE-CAPILLARY: 123 mg/dL — AB (ref 70–99)
GLUCOSE-CAPILLARY: 127 mg/dL — AB (ref 70–99)
Glucose-Capillary: 112 mg/dL — ABNORMAL HIGH (ref 70–99)
Glucose-Capillary: 140 mg/dL — ABNORMAL HIGH (ref 70–99)

## 2014-01-17 LAB — CBC
HCT: 21.8 % — ABNORMAL LOW (ref 39.0–52.0)
Hemoglobin: 7.5 g/dL — ABNORMAL LOW (ref 13.0–17.0)
MCH: 33.2 pg (ref 26.0–34.0)
MCHC: 34.4 g/dL (ref 30.0–36.0)
MCV: 96.5 fL (ref 78.0–100.0)
PLATELETS: 491 10*3/uL — AB (ref 150–400)
RBC: 2.26 MIL/uL — AB (ref 4.22–5.81)
RDW: 15.2 % (ref 11.5–15.5)
WBC: 7.9 10*3/uL (ref 4.0–10.5)

## 2014-01-17 LAB — PHOSPHORUS: Phosphorus: 3.3 mg/dL (ref 2.3–4.6)

## 2014-01-17 MED ORDER — MAGNESIUM SULFATE IN D5W 10-5 MG/ML-% IV SOLN
1.0000 g | Freq: Once | INTRAVENOUS | Status: AC
  Administered 2014-01-17: 1 g via INTRAVENOUS
  Filled 2014-01-17: qty 100

## 2014-01-17 MED ORDER — FAT EMULSION 20 % IV EMUL
250.0000 mL | INTRAVENOUS | Status: AC
  Administered 2014-01-17: 250 mL via INTRAVENOUS
  Filled 2014-01-17: qty 250

## 2014-01-17 MED ORDER — THIAMINE HCL 100 MG/ML IJ SOLN
INTRAVENOUS | Status: AC
  Administered 2014-01-17: 18:00:00 via INTRAVENOUS
  Filled 2014-01-17: qty 2000

## 2014-01-17 NOTE — Progress Notes (Signed)
Subjective: Patient up and walking with nursing student and states that his pain is bearable this morning. He has had a bowel movement today.   Objective: Vital signs in last 24 hours: Filed Vitals:   01/17/14 0348 01/17/14 0746 01/17/14 0759 01/17/14 1141  BP: 117/77 114/74  126/84  Pulse: 56 62  77  Temp: 97.5 F (36.4 C)  97 F (36.1 C) 97.8 F (36.6 C)  TempSrc: Oral  Oral Oral  Resp: 18 17  22   Height:      Weight: 196 lb 6.9 oz (89.1 kg)     SpO2: 100% 99%  100%   Weight change: 4 lb 10.1 oz (2.1 kg)  Intake/Output Summary (Last 24 hours) at 01/17/14 1528 Last data filed at 01/17/14 1500  Gross per 24 hour  Intake   2453 ml  Output 3392.5 ml  Net -939.5 ml   Physical Exam Vitals reviewed. General: Pt up and ambulating with assistance, NAD HEENT: PERRL, EOMI, no scleral icterus. NG tube in place  Cardiac: RRR Pulm: Respirations equal Abd: Soft, non-distended, appropriately TTP Ext: Moving all 4 extremities, no appreciable edema Neuro: Alert and oriented X3, CN 2-12 grossly intact, non-focal  Lab Results: Basic Metabolic Panel:  Recent Labs Lab 01/16/14 0335 01/17/14 0545  NA 143 147  K 4.3 4.5  CL 107 108  CO2 24 25  GLUCOSE 124* 129*  BUN 38* 38*  CREATININE 1.92* 1.59*  CALCIUM 8.5 9.2  MG 1.9 1.9  PHOS 3.2 3.3   Liver Function Tests:  Recent Labs Lab 01/16/14 0335 01/17/14 0545  AST 138* 121*  ALT 159* 184*  ALKPHOS 88 100  BILITOT 1.8* 1.6*  PROT 7.1 7.5  ALBUMIN 2.1* 2.3*   CBC:  Recent Labs Lab 01/13/14 0400  01/16/14 0335 01/17/14 0545  WBC 9.5  < > 8.4 7.9  NEUTROABS 7.8*  --   --   --   HGB 7.4*  < > 7.5* 7.5*  HCT 20.6*  < > 21.3* 21.8*  MCV 96.3  < > 94.7 96.5  PLT 268  < > 403* 491*  < > = values in this interval not displayed. Cardiac Enzymes:  Recent Labs Lab 01/14/14 0324 01/15/14 0310 01/16/14 0335  CKTOTAL 1619* 1046* 679*   CBG:  Recent Labs Lab 01/16/14 0551 01/16/14 1323 01/16/14 2340  01/17/14 0547 01/17/14 0610 01/17/14 1144  GLUCAP 143* 123* 127* 112* 123* 122*   Coagulation: No results found for this basename: LABPROT, INR,  in the last 168 hours  Micro Results: Recent Results (from the past 240 hour(s))  SURGICAL PCR SCREEN     Status: Abnormal   Collection Time    01/09/14  4:48 AM      Result Value Ref Range Status   MRSA, PCR NEGATIVE  NEGATIVE Final   Staphylococcus aureus POSITIVE (*) NEGATIVE Final   Comment:            The Xpert SA Assay (FDA     approved for NASAL specimens     in patients over 14 years of age),     is one component of     a comprehensive surveillance     program.  Test performance has     been validated by [employer] for patients greater     than or equal to 64 year old.     It is not intended     to diagnose infection nor to  guide or monitor treatment.  ANAEROBIC CULTURE     Status: None   Collection Time    01/09/14  1:46 PM      Result Value Ref Range Status   Specimen Description PERITONEAL FLUID   Final   Special Requests FLUID ON SWAB PT ON ZOSYN   Final   Gram Stain     Final   Value: RARE WBC PRESENT, PREDOMINANTLY MONONUCLEAR     NO SQUAMOUS EPITHELIAL CELLS SEEN     RARE GRAM POSITIVE COCCI     IN PAIRS     Performed at [facility]   Culture     Final   Value: NO ANAEROBES ISOLATED     Note: Gram Stain Report Called to,Read Back By and Verified With: DORA GARDNER ON 01/09/2014 AT 11:27P BY WILEJ     Performed at [facility]   Report Status 01/14/2014 FINAL   Final  BODY FLUID CULTURE     Status: None   Collection Time    01/09/14  1:46 PM      Result Value Ref Range Status   Specimen Description PERITONEAL FLUID   Final   Special Requests FLUID ON SWAB PT ON ZOSYN   Final   Gram Stain     Final   Value: WBC PRESENT, PREDOMINANTLY MONONUCLEAR     GRAM POSITIVE COCCI     IN PAIRS     Performed at [facility]   Culture     Final   Value: RARE ESCHERICHIA COLI      MODERATE STREPTOCOCCUS GROUP C     Note: Gram Stain Report Called to,Read Back By and Verified With: DORA GARDNER ON 01/09/2014 AT 11:27P BY WILEJ     Performed at [facility]   Report Status 01/12/2014 FINAL   Final   Organism ID, Bacteria ESCHERICHIA COLI   Final   Organism ID, Bacteria STREPTOCOCCUS GROUP C   Final   Studies/Results: Dg Abd Portable 1v  01/16/2014   CLINICAL DATA:  Postop ileus.  EXAM: PORTABLE ABDOMEN - 1 VIEW  COMPARISON:  01/09/2014  FINDINGS: Enteric tube is partially visualized with tip projecting over the gastric bubble. Skin staples project over the midline of the lower abdomen. Drain projects over the lower abdomen and pelvis. There is mild gaseous dilatation of multiple small bowel loops in the central and upper abdomen, measuring up to approximately 4.5 cm. The amount of small bowel dilatation has decreased from the prior study. No acute osseous abnormality is seen.  IMPRESSION: Interval improvement in small bowel obstruction following surgery. Residual small bowel dilatation could reflect postoperative ileus.   Electronically Signed   By: Logan Bores   On: 01/16/2014 10:38   Medications: I have reviewed the patient's current medications. Scheduled Meds: . antiseptic oral rinse  15 mL Mouth Rinse q12n4p  . chlorhexidine  15 mL Mouth Rinse BID  . insulin aspart  0-9 Units Subcutaneous 4 times per day  . piperacillin-tazobactam (ZOSYN)  IV  3.375 g Intravenous Q8H   Continuous Infusions: . Marland KitchenTPN (CLINIMIX-E) Adult 83 mL/hr at 01/17/14 1300   And  . fat emulsion 250 mL (01/17/14 1300)  . Marland KitchenTPN (CLINIMIX-E) Adult     And  . fat emulsion     PRN Meds:.HYDROmorphone (DILAUDID) injection, morphine injection, ondansetron  Assessment/Plan: The patient is a 63 yo man, presenting with abdominal pain, nausea, and vomiting, found to have a small bowel obstruction and subsequently ruptured appendicitis and is  s/p ex-lap.  #Acute small bowel obstruction and  ruptured appendicitis with intraabdominal abscess: POD#8 s/p ex lap and lysis of adhesions 2/2 ruptured appendicitis and SBO. Peritoneal fluid culture collected in the OR is growing rare E. Coli, moderate streptococcus group C. Anaerobic cultures are NGTD. Surgical pathology of the appendix shows acute supperative appendicitis. Pt with resolution of post-op ileus, BMx1 today. NG tube removed today but pt kept NPO for now. Continuing TPN.  - Appreciate surgery recs  - Continue NPO - Pain control with Dilaudid 0.5-71m q2h prn, IV Tylenol QID, morphine 288mq4h prn severe pain (he is not requiring this) - Zofran q6hr prn nausea - Continue Zosyn IV q8h for intraabdominal infection (antibiotic day 10), will consider deescalating tomorrow - Incentive spirometry - Mobilize OOB, PT eval and treat, OT eval and treat - Transfer out of step down tomorrow if remains stable  #Hypernatremia/hyperchloremia, resolved: Likely iatrogenic from aggressive IVF resuscitation combined with NGT losses and insensible losses from surgery. Steady trend down to normal after aggressive D5W infusion. - Stop IVF, continue volume resuscitation via TPN - I/Os > -3001cc since admission - Monitor BMP daily  Recent Labs Lab 01/13/14 1800 01/14/14 0324 01/15/14 0310 01/16/14 0335 01/17/14 0545  NA 141 138 143 143 147    #Acute on chronic anemia: Likely dilutional plus acute blood loss anemia, to be expected s/p abdominal surgery. Also iatrogenic from serial blood draws.  Patient has a [facility] macrocytic [facility] of unknown origin [facility] [place] [facility]al markers were normal and he even had a bone marrow biopsy that was unremarkable. No gross bleeding. NGT output is bilious. Smear 4/2 showed a target cells and slight polychromasia. Haptoglobin NOT low. LDH 324 (elevated). Iron panel below. Iron normal, ferritin increased (acute phase reactant). Retic % normal.  Stable today from 4/3. - Continue to monitor - Transfusion threshold Hgb <7  Recent Labs Lab 01/14/14 0324 01/15/14 0815 01/15/14 1940 01/16/14 0335 01/17/14 0545  HGB 7.2* 6.8* 8.3* 7.5* 7.5*   Iron/TIBC/Ferritin    Component Value Date/Time   IRON 66 01/15/2014 1130   TIBC 211* 01/15/2014 1130   FERRITIN 937* 01/15/2014 1130    #Acute kidney injury: Presented with a Cr of 8.62, from his last known baseline of 1.2. This likely represented a combination of prerenal azotemia and ATN. Cr downtrended with IVF resuscitation, stabilized at ~3.5 after his surgery, and continues trending down again since POD#5. Suspect resolving pre-renal azotemia from insensible losses during surgery/NPO +/- ATN. CK elevated at 2764 on 3/31 (it was 265 on admission), rhabdomyolysis is resolving. (AST also elevated which can suggest muscle injury.) Foley was discontinued and he is urinating normally. - Fluids per above  - Holding nephrotoxic meds  - Trend BMPs  - Trend CK  Recent Labs Lab 01/13/14 1800 01/14/14 0324 01/15/14 0310 01/16/14 0335 01/17/14 0545  CREATININE 3.31* 2.88* 2.35* 1.92* 1.59*    #Transaminitis and resolving hyperbilirubinemia: Total bilirubin peaked at 7.3 on 3/26. Mostly direct bilirubinemia. LFTs were only mildly elevated at that time. Bili has trended down, but then AST/ALT rose and plateaued; thought to be 2/2 hypoperfusion from hypovolemia. Unfortunately his ALT has risen today to 184, possibly 2/2 TPN.  Hepatitis panel negative. HIV NR. RUQ [place] no obvious obstruction. - am CMP  Hepatic Function Panel     Component Value Date/Time   PROT 7.5 01/17/2014 0545   ALBUMIN 2.3* 01/17/2014 0545   AST 121*  01/17/2014 0545   ALT 184* 01/17/2014 0545   ALKPHOS 100 01/17/2014 0545   BILITOT 1.6* 01/17/2014 0545   BILIDIR 0.8* 01/17/2014 0545   IBILI 0.8 01/17/2014 0545     #Refeeding: Dietitian and pharmacy consulted for TPN given prolonged n.p.o. status. He has an IJ central line in  correct position per PCCM. - Appreciate nutrition and pharmacy recs - TPN per pharmacy - Will continue CBG monitoring q6h and sensitive SSI coverage - IV Multivitamin, thiamine, folic acid daily in TPN - Trace elements M/W/F due to national shortage  - TPN labs (including monitor magnesium, potassium, and phosphorus daily for at least 3 days)  #Elevated anion gap: Resolves. Likely secondary to uremia vs. starvation ketoacidosis. Serial lactates have been normal. - IVF as above  - Trend BMET    #Alcohol use: The patient drinks 1-3 craft beers/day x 5 days per week. His last drink was on St. Patrick's Day (March 17). Withdrawal unlikely at this point. CIWAs 0 x several days. - Stop CIWA checks - Thiamine, folate, MV via TPN  #DVT PPx: Holding Heparin 2/2 low hgb, SCDs   Dispo: Disposition is deferred at this time, awaiting improvement of current medical problems.  Anticipated discharge in approximately 2-3 day(s).   The patient does have a current PCP (Dionne Volanda Napoleon, MD) and does not need an [facility] hospital follow-up appointment after discharge.  The patient does not have transportation limitations that hinder transportation to clinic appointments.  .Services Needed at time of discharge: Y = Yes, Blank = No PT: No PT follow up;Supervision - Intermittent  OT: No OT follow up;Supervision - Intermittent (needs to follow up for outpt therapy s/p RTC repair)   RN:   Equipment: Rolling walker with 5" wheels   Other:      LOS: 10 days   Otho Bellows, MD 01/17/2014, 3:28 PM

## 2014-01-17 NOTE — Progress Notes (Signed)
PARENTERAL NUTRITION CONSULT NOTE - Follow up   Pharmacy Consult for TPN Indication: Prolonged ileus  No Known Allergies  Patient Measurements: Height: 6\' 2"  (188 cm) Weight: 196 lb 6.9 oz (89.1 kg) IBW/kg (Calculated) : 82.2   Vital Signs: Temp: 97 F (36.1 C) (04/04 0759) Temp src: Oral (04/04 0759) BP: 117/77 mmHg (04/04 0348) Pulse Rate: 56 (04/04 0348) Intake/Output from previous day: 04/03 0701 - 04/04 0700 In: 1762 [IV Piggyback:450; TPN:1312] Out: 2527 [Urine:2400; Emesis/NG output:122; Drains:5] Intake/Output from this shift:    Labs:  Recent Labs  01/15/14 1940 01/16/14 0335 01/17/14 0545  WBC 9.4 8.4 7.9  HGB 8.3* 7.5* 7.5*  HCT 24.0* 21.3* 21.8*  PLT 417* 403* 491*     Recent Labs  01/15/14 0310 01/16/14 0335 01/17/14 0545  NA 143 143 147  K 3.9 4.3 4.5  CL 105 107 108  CO2 25 24 25   GLUCOSE 130* 124* 129*  BUN 42* 38* 38*  CREATININE 2.35* 1.92* 1.59*  CALCIUM 8.2* 8.5 9.2  MG 2.0 1.9 1.9  PHOS 3.4 3.2 3.3  PROT 6.9 7.1 7.5  ALBUMIN 2.0* 2.1* 2.3*  AST 135* 138* 121*  ALT 136* 159* 184*  ALKPHOS 75 88 100  BILITOT 1.7* 1.8* 1.6*  BILIDIR  --  0.9* 0.8*  IBILI  --  0.9 0.8   Estimated Creatinine Clearance: 56.7 ml/min (by C-G formula based on Cr of 1.59).    Recent Labs  01/16/14 2340 01/17/14 0547 01/17/14 0610  GLUCAP 127* 112* 123*    Medical History: Past Medical History  Diagnosis Date  . Hypertension     Dr. Angela Nevinion Homes (PCP)  . Hypercholesterolemia   . [facility]  . SBO (small bowel obstruction) 01/07/2014    Medications:  Prescriptions prior to admission  Medication Sig Dispense Refill  . co-enzyme Q-10 30 MG capsule Take 30 mg by mouth daily.      Marland Kitchen. lisinopril (PRINIVIL,ZESTRIL) 20 MG tablet Take 30-40 mg by mouth daily.      . Multiple Vitamin (MULTIVITAMIN WITH MINERALS) TABS tablet Take 1 tablet by mouth daily.      . naproxen sodium (ANAPROX) 220 MG tablet Take 440 mg by  mouth daily as needed (for pain).      . Omega-3 Fatty Acids (FISH OIL) 1000 MG CAPS Take 1,000 mg by mouth daily.      Marland Kitchen. omeprazole (PRILOSEC) 20 MG capsule Take 20 mg by mouth daily.      . simvastatin (ZOCOR) 20 MG tablet Take 20 mg by mouth daily at 6 PM.        Insulin Requirements in the past 24 hours:  2 units of SSI  Current Nutrition:  Clinimix E 5/15 at 83 ml/hr + Lipids 20% at 10 mL/hr- provides 100g protein and 1894 kcal daily  Nutritional Goals:  2200 - 2400 Kcal, 105-120 g Protein, 2.2-2.4 Liters per RD recommendations 4/2  Admit: Admitted with SBO, failed conservative measures and taken to OR 3/27  GI: SBO this admit - S/p ex-lap on 3/27 for SBO and found to have perforated appy and intra abd abscess, s/p appendectomy.  Remains NPO. NG tube output 122cc in last 24 hours. Pt meets criteria for severe malnutrition in the context of acute illness and is at risk for refeeding syndrome- has been doing well so far with TPN. Plan is continue bowel rest. Prealbumin 14.5 at baseline 3/31. Abd xray 4/3 shows improvement in  SBO, possible postoperative ileus. Planning to clamp NG today  Endo: No hx DM - CBGs at goal - Elevated AG 18. Likely secondary to uremia vs. Starvation ketoacidosis   Lytes: Na 147, K 4.5, corrected Ca 10. Mg 1.9, Phos 3.3  Renal: ARI on admit. Likely combination of prerenal azotemia and ACE-I induced ATN vs. NSAID use. SCr continues to improved to 1.59, UOP 1.4   Pulm: 100% RA  Cards: Hx HTN, HLD - BP 117/77, HR 56 - no meds  Hepatobil: AST + Tbili down slightly, ALT up a b it, albumin low at 2.3, Trigs 215  Neuro: Hx CVA - No secondary stroke px -- f/u ASA (sticky note left on 3/27 and has not yet been addressed)  ID: Zosyn D#10 for SBO and intra-abd infection - Afebrile, WBC WNL - growing Ecoli + strep C in peritoneal fluid  Best Practices: SCDs, MC TPN Access: Triple lumen CVC 3/29  TPN day#: 5 (started 3/30)  Plan:  1. Continue Clinimix E 5/15 at  83 ml/hr + Lipids 20% at 10 mL/hr (Provides 1894 kcal (~86% goal); 100 gm protein (~96% goal)) 2. Continue SSI for now 3. Daily IV MVI, thiamine 100mg  and folic acid 1mg  in TPN 4. Trace elements M/W/F only due to national shortage 5. Mg 1gm IV x 1 6. BMET & Mg in AM 7. F/u clamping of NG and advancement of enteral diet   Lysle Pearl, PharmD, BCPS Pager # [phone] 01/17/2014 8:43 AM

## 2014-01-17 NOTE — Progress Notes (Signed)
8 Days Post-Op  Subjective: He seems a little down this AM, some flatus, no BM.  Chest is clear.    Objective: Vital signs in last 24 hours: Temp:  [97 F (36.1 C)-98.3 F (36.8 C)] 97 F (36.1 C) (04/04 0759) Pulse Rate:  [56-70] 56 (04/04 0348) Resp:  [18-20] 18 (04/04 0348) BP: (117-144)/(71-90) 117/77 mmHg (04/04 0348) SpO2:  [98 %-100 %] 100 % (04/04 0348) Weight:  [89.1 kg (196 lb 6.9 oz)] 89.1 kg (196 lb 6.9 oz) (04/04 0348) Last BM Date: 01/06/14 122 from NG recorded 5 ml from the drain Afebrile, vss  Creatinine continues to improve, H/H is stable Intake/Output from previous day: 04/03 0701 - 04/04 0700 In: 1762 [IV Piggyback:450; TPN:1312] Out: 2527 [Urine:2400; Emesis/NG output:122; Drains:5] Intake/Output this shift:    General appearance: alert, cooperative and no distress Resp: clear to auscultation bilaterally GI: soft, few BS, some flatus, he is sitting up but wound looks OK this AM.  Drainage from the JP is serous.  Lab Results:   Recent Labs  01/16/14 0335 01/17/14 0545  WBC 8.4 7.9  HGB 7.5* 7.5*  HCT 21.3* 21.8*  PLT 403* 491*    BMET  Recent Labs  01/16/14 0335 01/17/14 0545  NA 143 147  K 4.3 4.5  CL 107 108  CO2 24 25  GLUCOSE 124* 129*  BUN 38* 38*  CREATININE 1.92* 1.59*  CALCIUM 8.5 9.2   PT/INR No results found for this basename: LABPROT, INR,  in the last 72 hours   Recent Labs Lab 01/13/14 0400 01/14/14 0324 01/15/14 0310 01/16/14 0335 01/17/14 0545  AST 140* 164* 135* 138* 121*  ALT 81* 125* 136* 159* 184*  ALKPHOS 57 61 75 88 100  BILITOT 2.7* 2.2* 1.7* 1.8* 1.6*  PROT 6.9 6.9 6.9 7.1 7.5  ALBUMIN 1.9* 2.0* 2.0* 2.1* 2.3*     Lipase     Component Value Date/Time   LIPASE 24 01/06/2014 2345     Studies/Results: Dg Abd Portable 1v  01/16/2014   CLINICAL DATA:  Postop ileus.  EXAM: PORTABLE ABDOMEN - 1 VIEW  COMPARISON:  01/09/2014  FINDINGS: Enteric tube is partially visualized with tip projecting over the  gastric bubble. Skin staples project over the midline of the lower abdomen. Drain projects over the lower abdomen and pelvis. There is mild gaseous dilatation of multiple small bowel loops in the central and upper abdomen, measuring up to approximately 4.5 cm. The amount of small bowel dilatation has decreased from the prior study. No acute osseous abnormality is seen.  IMPRESSION: Interval improvement in small bowel obstruction following surgery. Residual small bowel dilatation could reflect postoperative ileus.   Electronically Signed   By: Sebastian Ache   On: 01/16/2014 10:38    Medications: . antiseptic oral rinse  15 mL Mouth Rinse q12n4p  . chlorhexidine  15 mL Mouth Rinse BID  . insulin aspart  0-9 Units Subcutaneous 4 times per day  . piperacillin-tazobactam (ZOSYN)  IV  3.375 g Intravenous Q8H  He has had 9 days of Zosyn  Assessment/Plan Admitted with SBO, at surgery found to have Perforated appendix, Intraabdominal abscess and SBO  Exploratory laparotomy, LOA, appendectomy and drainage of abscess, Dr.[name] 01/09/14.  Post op ileus  Acute renal failure  Acute liver injury  Hypertension  Hx of Stroke  dyslipidemia  Anemia  PCM/TNA  Deconditioning  Continue to mobilize, heparin held for low Hct, He is moving and has SCD's.  I plan pull  NG today.  Hopefully it can come out soon.  LOS: 10 days    Lonnie Henry 01/17/2014

## 2014-01-17 NOTE — Progress Notes (Signed)
Agree with above, will need dvt proph, ng out today, leave at npo but appears he is resolving, he can go to floor when primary team ready

## 2014-01-17 NOTE — Progress Notes (Signed)
Pharmacy: Zosyn  62yom continues on zosyn day #10 for intraabdominal abscess/SBO s/p ex lap/LOA?appendectomy/drainage of abscess on 3/27. Renal function improving, dose remains appropriate.  Zosyn 3/26 >>  3/27 MRSA PCR positive 3/27 Peritoneal fl >> Ecoli (S-ancef/cefepime/ceftaz/CTX/imi/zosyn/bactrim) and Group C strep (S-PCN) 3/27 Peritoneal fl>>negative for anaerobes  Plan: 1) Continue zosyn 3.375g IV q8 (4 hour infusion) for now but could probably de-escalate if team no longer wants anaerobic coverage as anaerobic culture is negative  Louie CasaJennifer Kriss Perleberg, PharmD, BCPS 01/17/2014, 7:51AM

## 2014-01-18 LAB — CBC
HCT: 24 % — ABNORMAL LOW (ref 39.0–52.0)
Hemoglobin: 8.3 g/dL — ABNORMAL LOW (ref 13.0–17.0)
MCH: 33.3 pg (ref 26.0–34.0)
MCHC: 34.6 g/dL (ref 30.0–36.0)
MCV: 96.4 fL (ref 78.0–100.0)
Platelets: 534 10*3/uL — ABNORMAL HIGH (ref 150–400)
RBC: 2.49 MIL/uL — ABNORMAL LOW (ref 4.22–5.81)
RDW: 14.9 % (ref 11.5–15.5)
WBC: 9.3 10*3/uL (ref 4.0–10.5)

## 2014-01-18 LAB — BASIC METABOLIC PANEL
BUN: 34 mg/dL — AB (ref 6–23)
CO2: 24 mEq/L (ref 19–32)
CREATININE: 1.71 mg/dL — AB (ref 0.50–1.35)
Calcium: 9.5 mg/dL (ref 8.4–10.5)
Chloride: 107 mEq/L (ref 96–112)
GFR calc non Af Amer: 41 mL/min — ABNORMAL LOW (ref >90)
GFR, EST AFRICAN AMERICAN: 48 mL/min — AB (ref >90)
Glucose, Bld: 144 mg/dL — ABNORMAL HIGH (ref 70–99)
Potassium: 4 mEq/L (ref 3.7–5.3)
Sodium: 146 mEq/L (ref 137–147)

## 2014-01-18 LAB — MAGNESIUM: MAGNESIUM: 1.9 mg/dL (ref 1.5–2.5)

## 2014-01-18 LAB — GLUCOSE, CAPILLARY: Glucose-Capillary: 133 mg/dL — ABNORMAL HIGH (ref 70–99)

## 2014-01-18 MED ORDER — ENOXAPARIN SODIUM 40 MG/0.4ML ~~LOC~~ SOLN
40.0000 mg | SUBCUTANEOUS | Status: DC
  Administered 2014-01-18: 40 mg via SUBCUTANEOUS
  Filled 2014-01-18 (×2): qty 0.4

## 2014-01-18 MED ORDER — THIAMINE HCL 100 MG/ML IJ SOLN
INTRAVENOUS | Status: AC
  Administered 2014-01-18: 17:00:00 via INTRAVENOUS
  Filled 2014-01-18: qty 2000

## 2014-01-18 MED ORDER — FAT EMULSION 20 % IV EMUL
250.0000 mL | INTRAVENOUS | Status: AC
  Administered 2014-01-18: 250 mL via INTRAVENOUS
  Filled 2014-01-18: qty 250

## 2014-01-18 NOTE — Progress Notes (Signed)
9 Days Post-Op  Subjective: + BS and more flatus.  He is really tired this Am.  Wounds look fine.  Objective: Vital signs in last 24 hours: Temp:  [97.6 F (36.4 C)-98.5 F (36.9 C)] 97.9 F (36.6 C) (04/05 0743) Pulse Rate:  [66-77] 73 (04/05 0743) Resp:  [21-25] 23 (04/05 0743) BP: (121-139)/(78-89) 136/89 mmHg (04/05 0743) SpO2:  [99 %-100 %] 100 % (04/05 0743) Weight:  [89 kg (196 lb 3.4 oz)] 89 kg (196 lb 3.4 oz) (04/05 0327) Last BM Date: 01/17/14 17 ml from the drain +BM x 1 Afebrile VSS Creatinine is 1.71. H/h is stable Intake/Output from previous day: 04/04 0701 - 04/05 0700 In: 2339 [IV Piggyback:200; UEA:5409]TPN:2139] Out: 2527.5 [Urine:2450; Emesis/NG output:60; Drains:17.5] Intake/Output this shift: Total I/O In: 143 [IV Piggyback:50; TPN:93] Out: 0   General appearance: alert, cooperative and no distress Resp: clear to auscultation bilaterally GI: soft sore +BS, + small BM, and wound looks fine, drain is clear.  Lab Results:   Recent Labs  01/17/14 0545 01/18/14 0515  WBC 7.9 9.3  HGB 7.5* 8.3*  HCT 21.8* 24.0*  PLT 491* 534*    BMET  Recent Labs  01/17/14 0545 01/18/14 0515  NA 147 146  K 4.5 4.0  CL 108 107  CO2 25 24  GLUCOSE 129* 144*  BUN 38* 34*  CREATININE 1.59* 1.71*  CALCIUM 9.2 9.5   PT/INR No results found for this basename: LABPROT, INR,  in the last 72 hours   Recent Labs Lab 01/13/14 0400 01/14/14 0324 01/15/14 0310 01/16/14 0335 01/17/14 0545  AST 140* 164* 135* 138* 121*  ALT 81* 125* 136* 159* 184*  ALKPHOS 57 61 75 88 100  BILITOT 2.7* 2.2* 1.7* 1.8* 1.6*  PROT 6.9 6.9 6.9 7.1 7.5  ALBUMIN 1.9* 2.0* 2.0* 2.1* 2.3*     Lipase     Component Value Date/Time   LIPASE 24 01/06/2014 2345     Studies/Results: Dg Abd Portable 1v  01/16/2014   CLINICAL DATA:  Postop ileus.  EXAM: PORTABLE ABDOMEN - 1 VIEW  COMPARISON:  01/09/2014  FINDINGS: Enteric tube is partially visualized with tip projecting over the gastric  bubble. Skin staples project over the midline of the lower abdomen. Drain projects over the lower abdomen and pelvis. There is mild gaseous dilatation of multiple small bowel loops in the central and upper abdomen, measuring up to approximately 4.5 cm. The amount of small bowel dilatation has decreased from the prior study. No acute osseous abnormality is seen.  IMPRESSION: Interval improvement in small bowel obstruction following surgery. Residual small bowel dilatation could reflect postoperative ileus.   Electronically Signed   By: Sebastian AcheAllen  Grady   On: 01/16/2014 10:38    Medications: . antiseptic oral rinse  15 mL Mouth Rinse q12n4p  . chlorhexidine  15 mL Mouth Rinse BID  . piperacillin-tazobactam (ZOSYN)  IV  3.375 g Intravenous Q8H    Assessment/Plan Admitted with SBO, at surgery found to have Perforated appendix, Intraabdominal abscess and SBO  Exploratory laparotomy, LOA, appendectomy and drainage of abscess, Dr.[name] 01/09/14.  Post op ileus  Acute renal failure  Acute liver injury  Hypertension  Hx of Stroke  dyslipidemia  Anemia  PCM/TNA  Deconditioning   Plan:  Clear liquids and continue to mobilize.  We may be able to get him to floor soon, I would restart DVT chemoprophylaxis.   LOS: 11 days    Lonnie Henry 01/18/2014

## 2014-01-18 NOTE — Progress Notes (Signed)
PARENTERAL NUTRITION CONSULT NOTE - Follow up   Pharmacy Consult for TPN Indication: Prolonged ileus  No Known Allergies  Patient Measurements: Height: 6\' 2"  (188 cm) Weight: 196 lb 3.4 oz (89 kg) IBW/kg (Calculated) : 82.2   Vital Signs: Temp: 97.8 F (36.6 C) (04/05 0327) Temp src: Oral (04/05 0327) BP: 123/88 mmHg (04/05 0327) Pulse Rate: 66 (04/05 0327) Intake/Output from previous day: 04/04 0701 - 04/05 0700 In: 2329 [IV Piggyback:200; AOZ:3086]TPN:2129] Out: 2527.5 [Urine:2450; Emesis/NG output:60; Drains:17.5] Intake/Output from this shift:    Labs:  Recent Labs  01/16/14 0335 01/17/14 0545 01/18/14 0515  WBC 8.4 7.9 9.3  HGB 7.5* 7.5* 8.3*  HCT 21.3* 21.8* 24.0*  PLT 403* 491* 534*     Recent Labs  01/16/14 0335 01/17/14 0545 01/18/14 0515  NA 143 147 146  K 4.3 4.5 4.0  CL 107 108 107  CO2 24 25 24   GLUCOSE 124* 129* 144*  BUN 38* 38* 34*  CREATININE 1.92* 1.59* 1.71*  CALCIUM 8.5 9.2 9.5  MG 1.9 1.9 1.9  PHOS 3.2 3.3  --   PROT 7.1 7.5  --   ALBUMIN 2.1* 2.3*  --   AST 138* 121*  --   ALT 159* 184*  --   ALKPHOS 88 100  --   BILITOT 1.8* 1.6*  --   BILIDIR 0.9* 0.8*  --   IBILI 0.9 0.8  --    Estimated Creatinine Clearance: 52.7 ml/min (by C-G formula based on Cr of 1.71).    Recent Labs  01/17/14 1738 01/17/14 2341 01/18/14 0517  GLUCAP 114* 140* 133*    Medical History: Past Medical History  Diagnosis Date  . Hypertension     Dr. Angela Nevinion Homes (PCP)  . Hypercholesterolemia   . [facility]  . SBO (small bowel obstruction) 01/07/2014    Medications:  Prescriptions prior to admission  Medication Sig Dispense Refill  . co-enzyme Q-10 30 MG capsule Take 30 mg by mouth daily.      Marland Kitchen. lisinopril (PRINIVIL,ZESTRIL) 20 MG tablet Take 30-40 mg by mouth daily.      . Multiple Vitamin (MULTIVITAMIN WITH MINERALS) TABS tablet Take 1 tablet by mouth daily.      . naproxen sodium (ANAPROX) 220 MG tablet Take 440  mg by mouth daily as needed (for pain).      . Omega-3 Fatty Acids (FISH OIL) 1000 MG CAPS Take 1,000 mg by mouth daily.      Marland Kitchen. omeprazole (PRILOSEC) 20 MG capsule Take 20 mg by mouth daily.      . simvastatin (ZOCOR) 20 MG tablet Take 20 mg by mouth daily at 6 PM.        Insulin Requirements in the past 24 hours:  2 units of SSI since 1800  Current Nutrition:  Clinimix E 5/15 at 83 ml/hr + Lipids 20% at 10 mL/hr- provides 100g protein and 1894 kcal daily  Nutritional Goals:  2200 - 2400 Kcal, 105-120 g Protein, 2.2-2.4 Liters per RD recommendations 4/2  Admit: Admitted with SBO, failed conservative measures and taken to OR 3/27  GI: SBO this admit - S/p ex-lap on 3/27 for SBO and found to have perforated appy and intra abd abscess, s/p appendectomy.  Remains NPO. NG tube output 122cc in last 24 hours. Pt meets criteria for severe malnutrition in the context of acute illness and is at risk for refeeding syndrome- has been doing well so far with TPN.  Plan is continue bowel rest. Prealbumin 14.5 at baseline 3/31. Abd xray 4/3 shows improvement in SBO, possible postoperative ileus. NG out but still NPO  Endo: No hx DM - CBGs at goal on SSI  Lytes: Na 146, K 4, corrected Ca 10.86, Mg 1.9, Phos 3.3  Renal: ARI on admit. Likely combination of prerenal azotemia and ACE-I induced ATN vs. NSAID use. SCr jumping around and now back up to 1.71, UOP 1.4   Pulm: 100% RA  Cards: Hx HTN, HLD - BP 123/88, HR 66 - no meds  Hepatobil: AST + Tbili down slightly, ALT up a bit, albumin low at 2.3, Trigs 215  Neuro: Hx CVA - No secondary stroke px -- f/u ASA (sticky note left on 3/27 and has not yet been addressed)  ID: Zosyn D#[id number] for SBO and intra-abd infection - Afebrile, WBC WNL - growing Ecoli + strep C in peritoneal fluid  Best Practices: SCDs, MC TPN Access: Triple lumen CVC 3/29  TPN day#: 6 (started 3/30)  Plan:  1. Continue Clinimix E 5/15 at 83 ml/hr + Lipids 20% at 10 mL/hr (Provides  1894 kcal (~86% goal); 100 gm protein (~96% goal)) 2. DC SSI + CBGs - no hx of DM and has required very little SSI 3. Daily IV MVI, thiamine 100mg  and folic acid 1mg  in TPN 4. Trace elements M/W/F only due to national shortage 5. F/u AM labs 6. F/u plans for enteral diet  Lysle Pearl, PharmD, BCPS Pager # [phone] 01/18/2014 7:42 AM

## 2014-01-18 NOTE — Progress Notes (Signed)
Pt report called to 6N, VSS, belongs packed to go to new room, family at  Bedside and aware of new room assignment. Medicines up to date, TPN and Lipids being changes currently by IV team. No complains of pain. Will continue to monitor until patient is transferred.

## 2014-01-18 NOTE — Progress Notes (Signed)
Subjective: Patient doing well this morning. States that he was up and walking the halls with assistance, and states that his pain is stable this morning. No bowel movement yet today but he does endorse flatus.   Objective: Vital signs in last 24 hours: Filed Vitals:   01/17/14 2343 01/18/14 0327 01/18/14 0743 01/18/14 1131  BP: 139/78 123/88 136/89 123/80  Pulse: 71 66 73 95  Temp: 97.8 F (36.6 C) 97.8 F (36.6 C) 97.9 F (36.6 C)   TempSrc: Oral Oral Oral   Resp: 21 21 23 26   Height:      Weight:  196 lb 3.4 oz (89 kg)  201 lb 15.1 oz (91.6 kg)  SpO2: 100% 100% 100% 100%   Weight change: -3.5 oz (-0.1 kg)  Intake/Output Summary (Last 24 hours) at 01/18/14 1311 Last data filed at 01/18/14 1200  Gross per 24 hour  Intake   2916 ml  Output   2160 ml  Net    756 ml   Physical Exam Vitals reviewed. General: Sitting up in a chair, NAD HEENT: PERRL, EOMI, no scleral icterus.   Cardiac: RRR Pulm: Respirations equal Abd: Soft, non-distended, mild TTP in RLQ, incision c/d/i, Drain with scant serous drainage Ext: Moving all 4 extremities, no appreciable edema Neuro: Alert and oriented X3, CN 2-12 grossly intact, non-focal  Lab Results: Basic Metabolic Panel:  Recent Labs Lab 01/16/14 0335 01/17/14 0545 01/18/14 0515  NA 143 147 146  K 4.3 4.5 4.0  CL 107 108 107  CO2 24 25 24   GLUCOSE 124* 129* 144*  BUN 38* 38* 34*  CREATININE 1.92* 1.59* 1.71*  CALCIUM 8.5 9.2 9.5  MG 1.9 1.9 1.9  PHOS 3.2 3.3  --    Liver Function Tests:  Recent Labs Lab 01/16/14 0335 01/17/14 0545  AST 138* 121*  ALT 159* 184*  ALKPHOS 88 100  BILITOT 1.8* 1.6*  PROT 7.1 7.5  ALBUMIN 2.1* 2.3*   CBC:  Recent Labs Lab 01/13/14 0400  01/17/14 0545 01/18/14 0515  WBC 9.5  < > 7.9 9.3  NEUTROABS 7.8*  --   --   --   HGB 7.4*  < > 7.5* 8.3*  HCT 20.6*  < > 21.8* 24.0*  MCV 96.3  < > 96.5 96.4  PLT 268  < > 491* 534*  < > = values in this interval not displayed. Cardiac  Enzymes:  Recent Labs Lab 01/14/14 0324 01/15/14 0310 01/16/14 0335  CKTOTAL 1619* 1046* 679*   CBG:  Recent Labs Lab 01/17/14 0547 01/17/14 0610 01/17/14 1144 01/17/14 1738 01/17/14 2341 01/18/14 0517  GLUCAP 112* 123* 122* 114* 140* 133*   Coagulation: No results found for this basename: LABPROT, INR,  in the last 168 hours  Micro Results: Recent Results (from the past 240 hour(s))  SURGICAL PCR SCREEN     Status: Abnormal   Collection Time    01/09/14  4:48 AM      Result Value Ref Range Status   MRSA, PCR NEGATIVE  NEGATIVE Final   Staphylococcus aureus POSITIVE (*) NEGATIVE Final   Comment:            The Xpert SA Assay (FDA     approved for NASAL specimens     in patients over 48 years of age),     is one component of     a comprehensive surveillance     program.  Test performance has     been validated by [facility] for patients greater     than or equal to 30 year old.     It is not intended     to diagnose infection nor to     guide or monitor treatment.  ANAEROBIC CULTURE     Status: None   Collection Time    01/09/14  1:46 PM      Result Value Ref Range Status   Specimen Description PERITONEAL FLUID   Final   Special Requests FLUID ON SWAB PT ON ZOSYN   Final   Gram Stain     Final   Value: RARE WBC PRESENT, PREDOMINANTLY MONONUCLEAR     NO SQUAMOUS EPITHELIAL CELLS SEEN     RARE GRAM POSITIVE COCCI     IN PAIRS     Performed at [facility]   Culture     Final   Value: NO ANAEROBES ISOLATED     Note: Gram Stain Report Called to,Read Back By and Verified With: DORA GARDNER ON 01/09/2014 AT 11:27P BY WILEJ     Performed at [facility]   Report Status 01/14/2014 FINAL   Final  BODY FLUID CULTURE     Status: None   Collection Time    01/09/14  1:46 PM      Result Value Ref Range Status   Specimen Description PERITONEAL FLUID   Final   Special Requests FLUID ON SWAB PT ON ZOSYN   Final   Gram Stain     Final   Value: WBC  PRESENT, PREDOMINANTLY MONONUCLEAR     GRAM POSITIVE COCCI     IN PAIRS     Performed at [facility]   Culture     Final   Value: RARE ESCHERICHIA COLI     MODERATE STREPTOCOCCUS GROUP C     Note: Gram Stain Report Called to,Read Back By and Verified With: DORA GARDNER ON 01/09/2014 AT 11:27P BY WILEJ     Performed at [facility]   Report Status 01/12/2014 FINAL   Final   Organism ID, Bacteria ESCHERICHIA COLI   Final   Organism ID, Bacteria STREPTOCOCCUS GROUP C   Final   Studies/Results: No results found. Medications: I have reviewed the patient's current medications. Scheduled Meds: . antiseptic oral rinse  15 mL Mouth Rinse q12n4p  . chlorhexidine  15 mL Mouth Rinse BID  . piperacillin-tazobactam (ZOSYN)  IV  3.375 g Intravenous Q8H   Continuous Infusions: . Marland KitchenTPN (CLINIMIX-E) Adult 83 mL/hr at 01/17/14 1751   And  . fat emulsion 250 mL (01/17/14 1751)  . Marland KitchenTPN (CLINIMIX-E) Adult     And  . fat emulsion     PRN Meds:.HYDROmorphone (DILAUDID) injection, morphine injection, ondansetron  Assessment/Plan: The patient is a 63 yo man, presenting with abdominal pain, nausea, and vomiting, found to have a small bowel obstruction and subsequently ruptured appendicitis and is s/p ex-lap.  #Acute small bowel obstruction and ruptured appendicitis with intraabdominal abscess: POD#9 s/p ex lap and lysis of adhesions 2/2 ruptured appendicitis and SBO. Peritoneal fluid culture collected in the OR is growing rare E. Coli, moderate streptococcus group C. Anaerobic cultures are NGTD. Surgical pathology of the appendix shows acute supperative appendicitis. Pt with resolution of post-op ileus, BMx1 today. NG tube removed 4/4, advancing diet today. Continuing TPN.  - Appreciate surgery recs  - Advance diet to clears - Pain control with Dilaudid 0.5-48m q2h prn, IV Tylenol QID, morphine 255mq4h prn severe pain (he is not requiring  this) - Zofran q6hr prn nausea - Continue Zosyn IV  q8h for intraabdominal infection (antibiotic day 10), will consider deescalating tomorrow - Incentive spirometry - Mobilize OOB, PT eval and treat, OT eval and treat - Transfer out of step down to med-surg  #Acute on chronic anemia: Likely dilutional plus acute blood loss anemia, to be expected s/p abdominal surgery. Also iatrogenic from serial blood draws.  Patient has a history of macrocytic anemia of unknown origin that was worked up extensively at [facility] (records are in [place]). Nutritional markers were normal and he even had a bone marrow biopsy that was unremarkable. No gross bleeding. NGT output is bilious. Smear 4/2 showed a target cells and slight polychromasia. Haptoglobin NOT low. LDH 324 (elevated). Iron panel below. Iron normal, ferritin increased (acute phase reactant). Retic % normal. Improved today - Continue to monitor - Transfusion threshold Hgb <7  Recent Labs Lab 01/15/14 0815 01/15/14 1940 01/16/14 0335 01/17/14 0545 01/18/14 0515  HGB 6.8* 8.3* 7.5* 7.5* 8.3*   Iron/TIBC/Ferritin    Component Value Date/Time   IRON 66 01/15/2014 1130   TIBC 211* 01/15/2014 1130   FERRITIN 937* 01/15/2014 1130    #Acute kidney injury: Presented with a Cr of 8.62, from his last known baseline of 1.2. This likely represented a combination of prerenal azotemia and ATN. Cr downtrended with IVF resuscitation, stabilized at ~3.5 after his surgery, and continues trending down again since POD#5. Suspect resolving pre-renal azotemia from insensible losses during surgery/NPO +/- ATN. CK elevated at 2764 on 3/31 (it was 265 on admission), rhabdomyolysis is resolving. (AST also elevated which can suggest muscle injury.) Foley was discontinued, and he continues urinating normally. - Starting clears - Holding nephrotoxic meds  - Trend BMPs  - Trend CK  Recent Labs Lab 01/14/14 0324 01/15/14 0310 01/16/14 0335 01/17/14 0545 01/18/14 0515  CREATININE 2.88* 2.35* 1.92* 1.59* 1.71*      #Transaminitis and resolving hyperbilirubinemia: Total bilirubin peaked at 7.3 on 3/26. Mostly direct bilirubinemia. LFTs were only mildly elevated at that time. Bili has trended down, but then AST/ALT rose and plateaued; thought to be 2/2 hypoperfusion from hypovolemia. Unfortunately his ALT increased to 184 on 4/4, possibly 2/2 TPN.  Hepatitis panel negative. HIV NR. RUQ [place] with no obvious obstruction. - am CMP  Hepatic Function Panel     Component Value Date/Time   PROT 7.5 01/17/2014 0545   ALBUMIN 2.3* 01/17/2014 0545   AST 121* 01/17/2014 0545   ALT 184* 01/17/2014 0545   ALKPHOS 100 01/17/2014 0545   BILITOT 1.6* 01/17/2014 0545   BILIDIR 0.8* 01/17/2014 0545   IBILI 0.8 01/17/2014 0545     #Hypernatremia/hyperchloremia, resolved: Likely iatrogenic from aggressive IVF resuscitation combined with NGT losses and insensible losses from surgery. Steady trend down to normal after aggressive D5W infusion. - Stop IVF, continue volume resuscitation via TPN - I/Os > -3001cc since admission - Monitor BMP daily  Recent Labs Lab 01/14/14 0324 01/15/14 0310 01/16/14 0335 01/17/14 0545 01/18/14 0515  NA 138 143 143 147 146    #Refeeding: Dietitian and pharmacy consulted for TPN given prolonged n.p.o. status. He has an IJ central line in correct position per PCCM. - Appreciate nutrition and pharmacy recs - TPN per pharmacy - Will continue CBG monitoring q6h and sensitive SSI coverage - IV Multivitamin, thiamine, folic acid daily in TPN - Trace elements M/W/F due to national shortage  - TPN labs (including monitor magnesium, potassium, and phosphorus daily for at least 3  days)  #Elevated anion gap: Resolved. Likely secondary to uremia vs. starvation ketoacidosis. Serial lactates have been normal. - IVF as above  - Trend BMET    #Alcohol use: The patient was drinking 1-3 craft beers/day x 5 days per week. His last drink was on St. Patrick's Day (March 17). Withdrawal unlikely at this point.  CIWA d/c'd. - Thiamine, folate, MV via TPN  #DVT PPx: Restarting Lovenox today, SCDs   Dispo: Disposition is deferred at this time, awaiting improvement of current medical problems.  Anticipated discharge in approximately 2-3 day(s).    The patient does have a current PCP (Dionne Volanda Napoleon, MD) and does not need an [facility] hospital follow-up appointment after discharge.  The patient does not have transportation limitations that hinder transportation to clinic appointments.  .Services Needed at time of discharge: Y = Yes, Blank = No PT: No PT follow up;Supervision - Intermittent  OT: No OT follow up;Supervision - Intermittent (needs to follow up for outpt therapy s/p RTC repair)   RN:   Equipment: Rolling walker with 5" wheels   Other:      LOS: 11 days   Otho Bellows, MD 01/18/2014, 1:11 PM

## 2014-01-18 NOTE — Progress Notes (Signed)
Try clears. JP has minimal serous drainage and may be able to come out tomorrow. Patient examined and I agree with the assessment and plan  Violeta GelinasBurke Tykel Badie, MD, MPH, FACS Trauma: [phone] General Surgery: [phone]  01/18/2014 2:10 PM

## 2014-01-19 LAB — COMPREHENSIVE METABOLIC PANEL
ALBUMIN: 2.4 g/dL — AB (ref 3.5–5.2)
ALT: 133 U/L — ABNORMAL HIGH (ref 0–53)
AST: 63 U/L — ABNORMAL HIGH (ref 0–37)
Alkaline Phosphatase: 106 U/L (ref 39–117)
BILIRUBIN TOTAL: 1.3 mg/dL — AB (ref 0.3–1.2)
BUN: 31 mg/dL — ABNORMAL HIGH (ref 6–23)
CALCIUM: 9.1 mg/dL (ref 8.4–10.5)
CHLORIDE: 104 meq/L (ref 96–112)
CO2: 24 mEq/L (ref 19–32)
Creatinine, Ser: 1.6 mg/dL — ABNORMAL HIGH (ref 0.50–1.35)
GFR calc Af Amer: 52 mL/min — ABNORMAL LOW (ref >90)
GFR calc non Af Amer: 45 mL/min — ABNORMAL LOW (ref >90)
Glucose, Bld: 141 mg/dL — ABNORMAL HIGH (ref 70–99)
Potassium: 4.1 mEq/L (ref 3.7–5.3)
Sodium: 143 mEq/L (ref 137–147)
Total Protein: 7.6 g/dL (ref 6.0–8.3)

## 2014-01-19 LAB — DIFFERENTIAL
Basophils Absolute: 0.1 10*3/uL (ref 0.0–0.1)
Basophils Relative: 1 % (ref 0–1)
Eosinophils Absolute: 0.2 10*3/uL (ref 0.0–0.7)
Eosinophils Relative: 2 % (ref 0–5)
LYMPHS ABS: 1.2 10*3/uL (ref 0.7–4.0)
LYMPHS PCT: 13 % (ref 12–46)
MONO ABS: 0.4 10*3/uL (ref 0.1–1.0)
MONOS PCT: 4 % (ref 3–12)
NEUTROS ABS: 7.2 10*3/uL (ref 1.7–7.7)
Neutrophils Relative %: 80 % — ABNORMAL HIGH (ref 43–77)

## 2014-01-19 LAB — CBC
HEMATOCRIT: 23.9 % — AB (ref 39.0–52.0)
Hemoglobin: 8.1 g/dL — ABNORMAL LOW (ref 13.0–17.0)
MCH: 32.5 pg (ref 26.0–34.0)
MCHC: 33.9 g/dL (ref 30.0–36.0)
MCV: 96 fL (ref 78.0–100.0)
Platelets: 535 10*3/uL — ABNORMAL HIGH (ref 150–400)
RBC: 2.49 MIL/uL — AB (ref 4.22–5.81)
RDW: 15 % (ref 11.5–15.5)
WBC: 9 10*3/uL (ref 4.0–10.5)

## 2014-01-19 LAB — PREALBUMIN: Prealbumin: 35.3 mg/dL — ABNORMAL HIGH (ref 17.0–34.0)

## 2014-01-19 LAB — MAGNESIUM: Magnesium: 1.7 mg/dL (ref 1.5–2.5)

## 2014-01-19 LAB — PHOSPHORUS: Phosphorus: 3.3 mg/dL (ref 2.3–4.6)

## 2014-01-19 LAB — TRIGLYCERIDES: Triglycerides: 155 mg/dL — ABNORMAL HIGH (ref <150)

## 2014-01-19 MED ORDER — SODIUM CHLORIDE 0.9 % IJ SOLN
10.0000 mL | INTRAMUSCULAR | Status: DC | PRN
  Administered 2014-01-20: 10 mL

## 2014-01-19 MED ORDER — FAT EMULSION 20 % IV EMUL
250.0000 mL | INTRAVENOUS | Status: DC
  Administered 2014-01-19: 250 mL via INTRAVENOUS
  Filled 2014-01-19: qty 250

## 2014-01-19 MED ORDER — TRACE MINERALS CR-CU-F-FE-I-MN-MO-SE-ZN IV SOLN
INTRAVENOUS | Status: DC
  Administered 2014-01-19: 18:00:00 via INTRAVENOUS
  Filled 2014-01-19: qty 2000

## 2014-01-19 MED ORDER — HEPARIN SODIUM (PORCINE) 5000 UNIT/ML IJ SOLN
5000.0000 [IU] | Freq: Three times a day (TID) | INTRAMUSCULAR | Status: DC
Start: 2014-01-19 — End: 2014-01-21
  Administered 2014-01-19 – 2014-01-21 (×7): 5000 [IU] via SUBCUTANEOUS
  Filled 2014-01-19 (×9): qty 1

## 2014-01-19 NOTE — Progress Notes (Signed)
Physical Therapy Treatment Patient Details Name: Lonnie MaserCarl Henry MRN: [id number] DOB: 1951-03-18 Today's Date: 01/19/2014    History of Present Illness Adm 3/25 and underwent Exploratory laparotomy, LOA, appendectomy and drainage of abscess(01/09/14)      PT Comments    Patient motivated to return to physical activity. Patient able to tolerate treatment well and continues to improve. RN present in room to given morning medication and clip line to gown for ease of bed mobility. Continue with current plan including stairs nest treatment. Continue to recommend no PT F/U with intermittent supervision for safety and assistance with abdominal brace.   Follow Up Recommendations  No PT follow up;Supervision - Intermittent     Equipment Recommendations  Rolling walker with 5" wheels    Recommendations for Other Services       Precautions / Restrictions Precautions Precautions: None Precaution Comments: NG tube; multiple lines; abdominal drain Required Braces or Orthoses: Other Brace/Splint Other Brace/Splint: abd binder Restrictions Weight Bearing Restrictions: No    Mobility  Bed Mobility Overal bed mobility: Modified Independent             General bed mobility comments: Increased time due to lines  Transfers Overall transfer level: Needs assistance Equipment used: Rolling walker (2 wheeled) Transfers: Sit to/from Stand Sit to Stand: Min guard         General transfer comment: Patient required cues to scoot towards edge of bed for transfer  Ambulation/Gait Ambulation/Gait assistance: Min guard Ambulation Distance (Feet): 600 Feet Assistive device: Rolling walker (2 wheeled) Gait Pattern/deviations: Step-through pattern;Trunk flexed     General Gait Details: Patient required cues for upright posture. No rest breaks required today   Stairs            Wheelchair Mobility    Modified Rankin (Stroke Patients Only)       Balance                                     Cognition Arousal/Alertness: Awake/alert Behavior During Therapy: WFL for tasks assessed/performed Overall Cognitive Status: Within Functional Limits for tasks assessed                      Exercises      General Comments        Pertinent Vitals/Pain Patient denies pain at this time.  Patient was given pain medicine pretherapy today    Home Living                      Prior Function            PT Goals (current goals can now be found in the care plan section) Progress towards PT goals: Progressing toward goals    Frequency  Min 3X/week    PT Plan Current plan remains appropriate    Co-evaluation             End of Session Equipment Utilized During Treatment: Gait belt Activity Tolerance: Patient tolerated treatment well Patient left: in chair;with call bell/phone within reach     Time: 0753-0823 PT Time Calculation (min): 30 min  Charges:                       G Codes:      Stacey Sago, SPTA 01/19/2014, 8:59 AM

## 2014-01-19 NOTE — Progress Notes (Signed)
  I have seen and examined the patient, and reviewed the daily progress note by Camille BalJimmy Chen, MS 3 and discussed the care of the patient with them. Please see my progress note from 01/19/2014 for further details regarding assessment and plan.    Signed:  Vivi BarrackSarah Anandi Abramo, MD 01/19/2014, 7:39 PM

## 2014-01-19 NOTE — Progress Notes (Signed)
Patient ID: Lonnie Henry, male   DOB: [birth year], 63 y.o.   MRN: [id number] 10 Days Post-Op  Subjective: + BS and more flatus.  No n/v with clears.  No fevers.    Objective: Vital signs in last 24 hours: Temp:  [97.4 F (36.3 C)-98.8 F (37.1 C)] 98.7 F (37.1 C) (04/06 0524) Pulse Rate:  [71-95] 73 (04/06 0524) Resp:  [16-29] 16 (04/06 0524) BP: (99-143)/(68-90) 127/90 mmHg (04/06 0524) SpO2:  [97 %-100 %] 100 % (04/06 0524) Weight:  [198 lb 14.4 oz (90.22 kg)-201 lb 15.1 oz (91.6 kg)] 198 lb 14.4 oz (90.22 kg) (04/05 1833) Last BM Date: 01/18/14 17 ml from the drain +BM x 1 Afebrile VSS Creatinine is 1.71. H/h is stable Intake/Output from previous day: 04/05 0701 - 04/06 0700 In: 3056 [P.O.:960; IV Piggyback:50; TPN:2046] Out: 305 [Urine:300; Drains:5] Intake/Output this shift:    General appearance: alert, cooperative and no distress Resp: clear to auscultation bilaterally GI: soft sore +BS, + small BM, and wound looks fine, drain is clear.  Lab Results:   Recent Labs  01/18/14 0515 01/19/14 0740  WBC 9.3 9.0  HGB 8.3* 8.1*  HCT 24.0* 23.9*  PLT 534* 535*    BMET  Recent Labs  01/18/14 0515 01/19/14 0740  NA 146 143  K 4.0 4.1  CL 107 104  CO2 24 24  GLUCOSE 144* 141*  BUN 34* 31*  CREATININE 1.71* 1.60*  CALCIUM 9.5 9.1   PT/INR No results found for this basename: LABPROT, INR,  in the last 72 hours   Recent Labs Lab 01/14/14 0324 01/15/14 0310 01/16/14 0335 01/17/14 0545 01/19/14 0740  AST 164* 135* 138* 121* 63*  ALT 125* 136* 159* 184* 133*  ALKPHOS 61 75 88 100 106  BILITOT 2.2* 1.7* 1.8* 1.6* 1.3*  PROT 6.9 6.9 7.1 7.5 7.6  ALBUMIN 2.0* 2.0* 2.1* 2.3* 2.4*     Lipase     Component Value Date/Time   LIPASE 24 01/06/2014 2345     Studies/Results: No results found.  Medications: . antiseptic oral rinse  15 mL Mouth Rinse q12n4p  . chlorhexidine  15 mL Mouth Rinse BID  . heparin  5,000 Units Subcutaneous 3 times per day  .  piperacillin-tazobactam (ZOSYN)  IV  3.375 g Intravenous Q8H    Assessment/Plan Admitted with SBO, at surgery found to have Perforated appendix, Intraabdominal abscess and SBO  Exploratory laparotomy, LOA, appendectomy and drainage of abscess, Dr.Burke Lonnie Henry 01/09/14.  Post op ileus  Acute renal failure  Acute liver injury  Hypertension  Hx of Stroke  dyslipidemia  Anemia  PCM/TNA  Deconditioning   Plan:  Full liquids and continue to mobilize.   LOS: 12 days    Lonnie Henry 01/19/2014

## 2014-01-19 NOTE — Progress Notes (Signed)
Subjective:  [name] did well overnight with no significant events. He continues to ambulate without trouble and is having flatus. Today he reports having a loose bowel movement this morning and having no difficulty or pain/burning with urination. States that his pain in manageable at a 4/10. [name] denies chest pain, sob, palpitations.  Objective: Vital signs in last 24 hours: Filed Vitals:   01/18/14 1600 01/18/14 1833 01/18/14 2205 01/19/14 0524  BP: 99/85 143/78 129/68 127/90  Pulse: 95 90 71 73  Temp: 97.4 F (36.3 C) 98.5 F (36.9 C) 98.8 F (37.1 C) 98.7 F (37.1 C)  TempSrc: Oral Oral Oral Oral  Resp: _0 Height:  _1  (1.88 m)    Weight:  90.22 kg (198 lb 14.4 oz)    SpO2: 100% 97% 100% 100%   Weight change: 2.6 kg (5 lb 11.7 oz)  Intake/Output Summary (Last 24 hours) at 01/19/14 0840 Last data filed at 01/19/14 0700  Gross per 24 hour  Intake   2913 ml  Output    305 ml  Net   2608 ml   Physical Exam: General: Sitting in chair in no acute distress HEENT: PEERL, Dry mucous membranes Cardio: RRR, No M/R/G Pulm: Lungs clear to auscultation bilaterally Abd: Soft, tender to palpation, scar at midline that is healing well with no erythema, swelling, or pus. Active bowel sounds Extremities: 2+ pedal and radial pulses, trace pitting edema in RLE Neuro: Alert and Oriented x3  Lab Results: _2 @ Micro Results: Recent Results (from the past 240 hour(s))  ANAEROBIC CULTURE     Status: None   Collection Time    01/09/14  1:46 PM      Result Value Ref Range Status   Specimen Description PERITONEAL FLUID   Final   Special Requests FLUID ON SWAB PT ON ZOSYN   Final   Gram Stain     Final   Value: RARE WBC PRESENT, PREDOMINANTLY MONONUCLEAR     NO SQUAMOUS EPITHELIAL CELLS SEEN     RARE GRAM POSITIVE COCCI     IN PAIRS     Performed at [facility]   Culture     Final   Value: NO ANAEROBES ISOLATED     Note: Gram Stain Report Called  to,Read Back By and Verified With: DORA GARDNER ON 01/09/2014 AT 11:27P BY WILEJ     Performed at [facility]   Report Status 01/14/2014 FINAL   Final  BODY FLUID CULTURE     Status: None   Collection Time    01/09/14  1:46 PM      Result Value Ref Range Status   Specimen Description PERITONEAL FLUID   Final   Special Requests FLUID ON SWAB PT ON ZOSYN   Final   Gram Stain     Final   Value: WBC PRESENT, PREDOMINANTLY MONONUCLEAR     GRAM POSITIVE COCCI     IN PAIRS     Performed at [facility]   Culture     Final   Value: RARE ESCHERICHIA COLI     MODERATE STREPTOCOCCUS GROUP C     Note: Gram Stain Report Called to,Read Back By and Verified With: DORA GARDNER ON 01/09/2014 AT 11:27P BY WILEJ     Performed at [facility]   Report Status 01/12/2014 FINAL   Final   Organism ID, Bacteria ESCHERICHIA COLI   Final   Organism ID, Bacteria STREPTOCOCCUS GROUP C  Final   Studies/Results: No results found. Medications:  Scheduled: . antiseptic oral rinse  15 mL Mouth Rinse q12n4p  . chlorhexidine  15 mL Mouth Rinse BID  . heparin  5,000 Units Subcutaneous 3 times per day  . piperacillin-tazobactam (ZOSYN)  IV  3.375 g Intravenous Q8H   Continuous: . Marland KitchenTPN (CLINIMIX-E) Adult 83 mL/hr at 01/19/14 0700   And  . fat emulsion 250 mL (01/18/14 1717)  . Marland KitchenTPN (CLINIMIX-E) Adult     And  . fat emulsion     ACZ:YSAYTKZSWFUXN (DILAUDID) injection, morphine injection, ondansetron, sodium chloride Scheduled Meds: . antiseptic oral rinse  15 mL Mouth Rinse q12n4p  . chlorhexidine  15 mL Mouth Rinse BID  . heparin  5,000 Units Subcutaneous 3 times per day  . piperacillin-tazobactam (ZOSYN)  IV  3.375 g Intravenous Q8H   Continuous Infusions: . Marland KitchenTPN (CLINIMIX-E) Adult 83 mL/hr at 01/19/14 0700   And  . fat emulsion 250 mL (01/18/14 1717)   PRN Meds:.HYDROmorphone (DILAUDID) injection, morphine injection, ondansetron Assessment/Plan: The patient is a 63 yo man,  presenting with abdominal pain, nausea, and vomiting, found to have a small bowel obstruction and subsequently ruptured appendicitis and is s/p ex-lap.  Principal Problem:   Ruptured appendicitis Active Problems:   Small bowel obstruction   Acute kidney injury   High anion gap metabolic acidosis   Hyponatremia   Hyperbilirubinemia   Transaminitis   Anemia  #Acute small bowel obstruction and ruptured appendicitis with intraabdominal abscess: POD#10 s/p ex lap and lysis of adhesions 2/2 ruptured appendicitis and SBO. Peritoneal fluid culture collected in the OR is growing rare E. Coli, moderate streptococcus group C. Anaerobic cultures are no growth. Surgical pathology of the appendix shows acute supperative appendicitis. Pt with resolution of post-op ileus. NG tube removed 4/4, advancing diet. Continuing TPN.  - Appreciate surgery recs  - Advance diet to full liquids  - Pain control with Dilaudid 0.5-$RemoveBeforeDEI'1mg'cVynEndlVnAvEYeY$  q2h prn, morphine $RemoveBefor'2mg'ggauYzzKTwCM$  q4h prn severe pain  - Zofran q6hr prn nausea  - Continue Zosyn IV q8h for intraabdominal infection (antibiotic day 11), will consider deescalating soon  - Incentive spirometry  - Mobilize OOB, PT eval and treat, OT eval and treat   #Acute on chronic anemia: Likely dilutional plus acute blood loss anemia, to be expected s/p abdominal surgery. Also iatrogenic from serial blood draws. Patient has a history of macrocytic anemia of unknown origin that was worked up extensively at [facility] (records are in [place]). Nutritional markers were normal and he even had a bone marrow biopsy that was unremarkable. No gross bleeding. NGT output is bilious. Smear 4/2 showed a target cells and slight polychromasia. Haptoglobin NOT low. LDH 324 (elevated). Iron panel below. Iron normal, ferritin increased (acute phase reactant). Retic % normal. Improved today. - Continue to monitor  - Transfusion threshold Hgb <7   Recent Labs  Lab  01/15/14 0815  01/15/14 1940   01/16/14 0335  01/17/14 0545  01/18/14 0515   HGB  6.8*  8.3*  7.5*  7.5*  8.3*    Iron/TIBC/Ferritin    Component  Value  Date/Time    IRON  66  01/15/2014 1130    TIBC  211*  01/15/2014 1130    FERRITIN  937*  01/15/2014 1130    #Acute kidney injury: Presented with a Cr of 8.62, from his last known baseline of 1.2. This likely represented a combination of prerenal azotemia and ATN. Cr downtrended with IVF resuscitation, stabilized at ~3.5 after  his surgery, and continues trending down again since POD#5. Suspect resolving pre-renal azotemia from insensible losses during surgery/NPO +/- ATN. CK elevated at 2764 on 3/31 (it was 265 on admission), CK trended and rhabdomyolysis resolving. Foley was discontinued, and he continues urinating normally.  - Clears to full liquids - Holding nephrotoxic meds  - Trend BMPs  - Changing DVT ppx from Lovenox to Heparin given slight Cr bump overnight   Recent Labs  Lab  01/14/14 0324  01/15/14 0310  01/16/14 0335  01/17/14 0545  01/18/14 0515   CREATININE  2.88*  2.35*  1.92*  1.59*  1.71*    #Transaminitis and resolving hyperbilirubinemia: Total bilirubin peaked at 7.3 on 3/26. Mostly direct bilirubinemia. LFTs were only mildly elevated at that time. Bili has trended down, but then AST/ALT rose and plateaued; thought to be 2/2 hypoperfusion from hypovolemia. His ALT increased to 184 on 4/4, possibly 2/2 TPN. Hepatitis panel negative. HIV NR. RUQ [place] with no obvious obstruction.  - am CMP  Hepatic Function Panel     Component Value Date/Time   PROT 7.6 01/19/2014 0740   ALBUMIN 2.4* 01/19/2014 0740   AST 63* 01/19/2014 0740   ALT 133* 01/19/2014 0740   ALKPHOS 106 01/19/2014 0740   BILITOT 1.3* 01/19/2014 0740   BILIDIR 0.8* 01/17/2014 0545   IBILI 0.8 01/17/2014 0545    #Hypernatremia/hyperchloremia, resolved: Likely iatrogenic from aggressive IVF resuscitation combined with NGT losses and insensible losses from surgery. Steady trend down to normal after  aggressive D5W infusion.  - Stop IVF, continue volume resuscitation via TPN  - I/Os > -1794cc since admission  - Monitor BMP daily   #Refeeding: Dietitian and pharmacy consulted for TPN given prolonged n.p.o. status. He has an IJ central line in correct position per PCCM.  - Appreciate nutrition and pharmacy recs  - TPN per pharmacy  - Discontinue CBG monitoring q6h and sensitive SSI coverage per pharmacists rec - IV Multivitamin, thiamine, folic acid daily in TPN  - Trace elements M/W/F due to national shortage  - Discontinued TPN labs (including monitor magnesium, potassium, and phosphorus daily for at least 3 days)   #Elevated anion gap: Resolved. Likely secondary to uremia vs. starvation ketoacidosis. Serial lactates have been normal.  - IVF as above  - Trend BMET   #Alcohol use: The patient was drinking 1-3 craft beers/day x 5 days per week. His last drink was on St. Patrick's Day (March 17). Withdrawal unlikely at this point. CIWA d/c'd.  - Thiamine, folate, MV via TPN  #DVT PPx: Heparin subq, SCDs   Dispo: Disposition is deferred at this time, awaiting improvement of current medical problems. Anticipated discharge in approximately 2-3 day(s).  The patient does have a current PCP (Dionne Volanda Napoleon, MD) and does not need an [facility] hospital follow-up appointment after discharge.   The patient does not have transportation limitations that hinder transportation to clinic appointments.  .Services Needed at time of discharge: Y = Yes, Blank = No  PT:  No PT follow up;Supervision - Intermittent   OT:  No OT follow up;Supervision - Intermittent (needs to follow up for outpt therapy s/p RTC repair)   RN:    Equipment:  Rolling walker with 5" wheels   Other:      This is a Careers information officer Note.  The care of the patient was discussed with Dr. Lucila Maine and Dr. Marinda Elk and the assessment and plan formulated with their assistance.  Please see their attached note for official documentation  of  the daily encounter.   LOS: 12 days   Durene Fruits, Med Student 01/19/2014, 8:40 AM

## 2014-01-19 NOTE — Progress Notes (Signed)
Internal Medicine Attending  Date: 01/19/2014  Patient name: Lonnie MaserCarl Martorano Medical record number: [id number] Date of birth: 1951-09-28 Age: 63 y.o. Gender: male  I saw and evaluated the patient, and discussed his care on A.M rounds with housestaff.  I reviewed the resident's note by Dr. Claudell Kyleater and I agree with the resident's findings and plans as documented in her note.

## 2014-01-19 NOTE — Progress Notes (Signed)
Orthopedic Tech Progress Note Patient Details:  Lonnie MaserCarl Henry June 22, 1951 [id number] Patient's binder from surgery is soiled. New binder delivered to nursing station.  Ortho Devices Type of Ortho Device: Abdominal binder Ortho Device/Splint Interventions: Ordered   [place] R Thompson 01/19/2014, 1:49 PM

## 2014-01-19 NOTE — Progress Notes (Signed)
Seen and agreed 01/19/2014 Robinette, Julia Elizabeth PTA [phone] pager [phone] office    

## 2014-01-19 NOTE — Progress Notes (Signed)
Occupational Therapy Treatment Patient Details Name: Kathaleen MaserCarl Strohmeyer MRN: [id number] DOB: [birth year] Today's Date: 01/19/2014    History of present illness Adm 3/25 and underwent Exploratory laparotomy, LOA, appendectomy and drainage of abscess(01/09/14)     OT comments  Pt progressing towards goals. Performed RUE exercises.    Follow Up Recommendations  No OT follow up;Supervision - Intermittent (needs to follow up for outpt therapy for RTC repair)    Equipment Recommendations       Recommendations for Other Services      Precautions / Restrictions Precautions Precaution Comments: NG tube; multiple lines; abdominal drain Required Braces or Orthoses: Other Brace/Splint Other Brace/Splint: abd binder Restrictions Weight Bearing Restrictions: No       Mobility Bed Mobility Overal bed mobility: Modified Independent                Transfers Overall transfer level: Needs assistance Equipment used: Rolling walker (2 wheeled) Transfers: Sit to/from Stand Sit to Stand: Min guard         General transfer comment: Min guard for safety.    Balance                                   ADL Overall ADL's : Needs assistance/impaired                     Lower Body Dressing: Supervision/safety;Sitting/lateral leans (sock)   Toilet Transfer: Min guard;Ambulation;RW (chair and bed)           Functional mobility during ADLs: Min guard;Rolling walker (encouraged pt to stand upright) General ADL Comments: Pt able to bend to don right sock. Educated that if this is painful can cross legs over knees or AE is available.      Vision                     Perception     Praxis      Cognition   Behavior During Therapy: [facility]FL for tasks assessed/performed Overall Cognitive Status: Within Functional Limits for tasks assessed                       Extremity/Trunk Assessment               Exercises Other Exercises Other Exercises:  Rt shoulder flexion/extension A/AAROM  in supine;approximately 10 reps and tried to hold at end range Other Exercises: Rt external rotation in supine; approximately 15 reps AROM  Other Exercises: shoulder rolls   Shoulder Instructions       General Comments      Pertinent Vitals/ Pain       Pain 4/10. Pt appeared comfortable in bed at end of session.   Home Living                                          Prior Functioning/Environment              Frequency Min 2X/week     Progress Toward Goals  OT Goals(current goals can now be found in the care plan section)  Progress towards OT goals: Progressing toward goals  Acute Rehab OT Goals Patient Stated Goal: get back to lifting weights OT Goal Formulation: With patient Time For Goal Achievement: 01/28/14 Potential to Achieve Goals: Good ADL Goals  Pt Will Perform Lower Body Bathing: with modified independence;sit to/from stand;with adaptive equipment Pt Will Perform Lower Body Dressing: with modified independence;sit to/from stand;with adaptive equipment Pt Will Transfer to Toilet: with modified independence;ambulating;bedside commode Pt Will Perform Toileting - Clothing Manipulation and hygiene: with modified independence;sit to/from stand Pt/caregiver will Perform Home Exercise Program: Right Upper extremity;With theraband;Both right and left upper extremity;With written HEP provided  Plan Discharge plan remains appropriate    Co-evaluation                 End of Session Equipment Utilized During Treatment: Gait belt;Rolling walker;Other (comment) (abdominal binder)   Activity Tolerance Patient tolerated treatment well   Patient Left in bed;with call bell/phone within reach;with family/visitor present   Nurse Communication Mobility status        Time: 5409-8119 OT Time Calculation (min): 15 min  Charges: OT General Charges $OT Visit: 1 Procedure OT Treatments $Therapeutic Exercise:  8-22 mins  Earlie Raveling  OTR/L [phone]  01/19/2014, 1:02 PM

## 2014-01-19 NOTE — Progress Notes (Signed)
Subjective: Patient seen and examined at the bedside this morning. He is making good flatus and had a BM this morning. Eating Jello, sitting up in his chair. Pain is improved.  Objective: Vital signs in last 24 hours: Filed Vitals:   01/18/14 1600 01/18/14 1833 01/18/14 2205 01/19/14 0524  BP: 99/85 143/78 129/68 127/90  Pulse: 95 90 71 73  Temp: 97.4 F (36.3 C) 98.5 F (36.9 C) 98.8 F (37.1 C) 98.7 F (37.1 C)  TempSrc: Oral Oral Oral Oral  Resp: 29 22 18 16   Height:  6' 2"  (1.88 m)    Weight:  198 lb 14.4 oz (90.22 kg)    SpO2: 100% 97% 100% 100%   Weight change: 5 lb 11.7 oz (2.6 kg)  Intake/Output Summary (Last 24 hours) at 01/19/14 0657 Last data filed at 01/19/14 0525  Gross per 24 hour  Intake   2033 ml  Output    305 ml  Net   1728 ml   Physical Exam Vitals reviewed. General: Sitting up in a chair, NAD HEENT: PERRL, EOMI, no scleral icterus.   Cardiac: RRR Pulm: Respirations equal Abd: Soft, non-distended, mild TTP in RLQ, incision c/d/i, Drain with scant serous drainage Ext: Moving all 4 extremities, no appreciable edema Neuro: Alert and oriented X3, CN 2-12 grossly intact, non-focal  Lab Results: Basic Metabolic Panel:  Recent Labs Lab 01/16/14 0335 01/17/14 0545 01/18/14 0515  NA 143 147 146  K 4.3 4.5 4.0  CL 107 108 107  CO2 24 25 24   GLUCOSE 124* 129* 144*  BUN 38* 38* 34*  CREATININE 1.92* 1.59* 1.71*  CALCIUM 8.5 9.2 9.5  MG 1.9 1.9 1.9  PHOS 3.2 3.3  --    Liver Function Tests:  Recent Labs Lab 01/16/14 0335 01/17/14 0545  AST 138* 121*  ALT 159* 184*  ALKPHOS 88 100  BILITOT 1.8* 1.6*  PROT 7.1 7.5  ALBUMIN 2.1* 2.3*   CBC:  Recent Labs Lab 01/13/14 0400  01/17/14 0545 01/18/14 0515  WBC 9.5  < > 7.9 9.3  NEUTROABS 7.8*  --   --   --   HGB 7.4*  < > 7.5* 8.3*  HCT 20.6*  < > 21.8* 24.0*  MCV 96.3  < > 96.5 96.4  PLT 268  < > 491* 534*  < > = values in this interval not displayed. Cardiac Enzymes:  Recent  Labs Lab 01/14/14 0324 01/15/14 0310 01/16/14 0335  CKTOTAL 1619* 1046* 679*   CBG:  Recent Labs Lab 01/17/14 0547 01/17/14 0610 01/17/14 1144 01/17/14 1738 01/17/14 2341 01/18/14 0517  GLUCAP 112* 123* 122* 114* 140* 133*    Micro Results: Recent Results (from the past 240 hour(s))  ANAEROBIC CULTURE     Status: None   Collection Time    01/09/14  1:46 PM      Result Value Ref Range Status   Specimen Description PERITONEAL FLUID   Final   Special Requests FLUID ON SWAB PT ON ZOSYN   Final   Gram Stain     Final   Value: RARE WBC PRESENT, PREDOMINANTLY MONONUCLEAR     NO SQUAMOUS EPITHELIAL CELLS SEEN     RARE GRAM POSITIVE COCCI     IN PAIRS     Performed at [facility]   Culture     Final   Value: NO ANAEROBES ISOLATED     Note: Gram Stain Report Called to,Read Back By and Verified With: DORA GARDNER ON 01/09/2014 AT  11:27P BY WILEJ     Performed at [facility]   Report Status 01/14/2014 FINAL   Final  BODY FLUID CULTURE     Status: None   Collection Time    01/09/14  1:46 PM      Result Value Ref Range Status   Specimen Description PERITONEAL FLUID   Final   Special Requests FLUID ON SWAB PT ON ZOSYN   Final   Gram Stain     Final   Value: WBC PRESENT, PREDOMINANTLY MONONUCLEAR     GRAM POSITIVE COCCI     IN PAIRS     Performed at [facility]   Culture     Final   Value: RARE ESCHERICHIA COLI     MODERATE STREPTOCOCCUS GROUP C     Note: Gram Stain Report Called to,Read Back By and Verified With: DORA GARDNER ON 01/09/2014 AT 11:27P BY WILEJ     Performed at [facility]   Report Status 01/12/2014 FINAL   Final   Organism ID, Bacteria ESCHERICHIA COLI   Final   Organism ID, Bacteria STREPTOCOCCUS GROUP C   Final   Studies/Results: No results found. Medications: I have reviewed the patient's current medications. Scheduled Meds: . antiseptic oral rinse  15 mL Mouth Rinse q12n4p  . chlorhexidine  15 mL Mouth Rinse BID  .  enoxaparin (LOVENOX) injection  40 mg Subcutaneous Q24H  . piperacillin-tazobactam (ZOSYN)  IV  3.375 g Intravenous Q8H   Continuous Infusions: . Marland KitchenTPN (CLINIMIX-E) Adult 83 mL/hr at 01/18/14 1717   And  . fat emulsion 250 mL (01/18/14 1717)   PRN Meds:.HYDROmorphone (DILAUDID) injection, morphine injection, ondansetron  Assessment/Plan: The patient is a 63 yo man, presenting with abdominal pain, nausea, and vomiting, found to have a small bowel obstruction and subsequently ruptured appendicitis and is s/p ex-lap.  #Acute small bowel obstruction and ruptured appendicitis with intraabdominal abscess: POD#10 s/p ex lap and lysis of adhesions 2/2 ruptured appendicitis and SBO. Peritoneal fluid culture collected in the OR is growing rare E. Coli, moderate streptococcus group C. Anaerobic cultures are no growth. Surgical pathology of the appendix shows acute supperative appendicitis. Pt with resolution of post-op ileus. NG tube removed 4/4, advancing diet. Continuing TPN.  - Appreciate surgery recs  - Advance diet to full liquids - Pain control with Dilaudid 0.5-5m q2h prn, morphine 257mq4h prn severe pain - Zofran q6hr prn nausea - Continue Zosyn IV q8h for intraabdominal infection (antibiotic day 11), will consider deescalating soon - Incentive spirometry  - Mobilize OOB, PT eval and treat, OT eval and treat  #Acute on chronic anemia: Likely dilutional plus acute blood loss anemia, to be expected s/p abdominal surgery. Also iatrogenic from serial blood draws.  Patient has a [facility] of unknown origin [facility] [place] [facility] markers were normal and he even had a bone marrow biopsy that was unremarkable. No gross bleeding. NGT output is bilious. Smear 4/2 showed a target cells and slight polychromasia. Haptoglobin NOT low. LDH 324 (elevated). Iron panel below. Iron normal, ferritin increased (acute phase  reactant). Retic % normal. Improved today - Continue to monitor - Transfusion threshold Hgb <7  Recent Labs Lab 01/15/14 0815 01/15/14 1940 01/16/14 0335 01/17/14 0545 01/18/14 0515  HGB 6.8* 8.3* 7.5* 7.5* 8.3*    Iron/TIBC/Ferritin    Component Value Date/Time   IRON 66 01/15/2014 1130   TIBC 211* 01/15/2014 1130  FERRITIN 937* 01/15/2014 1130    #Acute kidney injury: Presented with a Cr of 8.62, from his last known baseline of 1.2. This likely represented a combination of prerenal azotemia and ATN. Cr downtrended with IVF resuscitation, stabilized at ~3.5 after his surgery, and continues trending down again since POD#5. Suspect resolving pre-renal azotemia from insensible losses during surgery/NPO +/- ATN. CK elevated at 2764 on 3/31 (it was 265 on admission), CK trended and rhabdomyolysis resolving. Foley was discontinued, and he continues urinating normally. - Clears - Holding nephrotoxic meds  - Trend BMPs  - Changing DVT ppx from Lovenox to Heparin given slight Cr bump overnight  Recent Labs Lab 01/14/14 0324 01/15/14 0310 01/16/14 0335 01/17/14 0545 01/18/14 0515  CREATININE 2.88* 2.35* 1.92* 1.59* 1.71*    #Transaminitis and resolving hyperbilirubinemia: Total bilirubin peaked at 7.3 on 3/26. Mostly direct bilirubinemia. LFTs were only mildly elevated at that time. Bili has trended down, but then AST/ALT rose and plateaued; thought to be 2/2 hypoperfusion from hypovolemia. His ALT increased to 184 on 4/4, possibly 2/2 TPN.  Hepatitis panel negative. HIV NR. RUQ [place] with no obvious obstruction. - am CMP  Hepatic Function Panel     Component Value Date/Time   PROT 7.5 01/17/2014 0545   ALBUMIN 2.3* 01/17/2014 0545   AST 121* 01/17/2014 0545   ALT 184* 01/17/2014 0545   ALKPHOS 100 01/17/2014 0545   BILITOT 1.6* 01/17/2014 0545   BILIDIR 0.8* 01/17/2014 0545   IBILI 0.8 01/17/2014 0545     #Hypernatremia/hyperchloremia, resolved: Likely iatrogenic from aggressive IVF  resuscitation combined with NGT losses and insensible losses from surgery. Steady trend down to normal after aggressive D5W infusion. - Stop IVF, continue volume resuscitation via TPN - I/Os > -1794cc since admission - Monitor BMP daily  #Refeeding: Dietitian and pharmacy consulted for TPN given prolonged n.p.o. status. He has an IJ central line in correct position per PCCM. - Appreciate nutrition and pharmacy recs - TPN per pharmacy - Will continue CBG monitoring q6h and sensitive SSI coverage - IV Multivitamin, thiamine, folic acid daily in TPN - Trace elements M/W/F due to national shortage  - TPN labs (including monitor magnesium, potassium, and phosphorus daily for at least 3 days)  #Elevated anion gap: Resolved. Likely secondary to uremia vs. starvation ketoacidosis. Serial lactates have been normal. - IVF as above  - Trend BMET    #Alcohol use: The patient was drinking 1-3 craft beers/day x 5 days per week. His last drink was on St. Patrick's Day (March 17). Withdrawal unlikely at this point. CIWA d/c'd. - Thiamine, folate, MV via TPN  #DVT PPx: Heparin subq, SCDs   Dispo: Disposition is deferred at this time, awaiting improvement of current medical problems.  Anticipated discharge in approximately 2-3 day(s).    The patient does have a current PCP (Dionne Volanda Napoleon, MD) and does not need an [facility] hospital follow-up appointment after discharge.  The patient does not have transportation limitations that hinder transportation to clinic appointments.  .Services Needed at time of discharge: Y = Yes, Blank = No PT: No PT follow up;Supervision - Intermittent  OT: No OT follow up;Supervision - Intermittent (needs to follow up for outpt therapy s/p RTC repair)   RN:   Equipment: Rolling walker with 5" wheels   Other:      LOS: 12 days   Lesly Dukes, MD 01/19/2014, 6:57 AM   Lesly Dukes, MD  [email or web address] Pager # [phone] Office # [phone]

## 2014-01-19 NOTE — Progress Notes (Signed)
PARENTERAL NUTRITION CONSULT NOTE - Follow up   Pharmacy Consult for TPN Indication: Prolonged ileus  No Known Allergies  Patient Measurements: Height: 6\' 2"  (188 cm) Weight: 198 lb 14.4 oz (90.22 kg) IBW/kg (Calculated) : 82.2   Vital Signs: Temp: 98.7 F (37.1 C) (04/06 0524) Temp src: Oral (04/06 0524) BP: 127/90 mmHg (04/06 0524) Pulse Rate: 73 (04/06 0524) Intake/Output from previous day: 04/05 0701 - 04/06 0700 In: 3056 [P.O.:960; IV Piggyback:50; TPN:2046] Out: 305 [Urine:300; Drains:5] Intake/Output from this shift: Total I/O In: -  Out: 400 [Urine:400]  Labs:  Recent Labs  01/17/14 0545 01/18/14 0515 01/19/14 0740  WBC 7.9 9.3 9.0  HGB 7.5* 8.3* 8.1*  HCT 21.8* 24.0* 23.9*  PLT 491* 534* 535*     Recent Labs  01/17/14 0545 01/18/14 0515 01/19/14 0740  NA 147 146 143  K 4.5 4.0 4.1  CL 108 107 104  CO2 25 24 24   GLUCOSE 129* 144* 141*  BUN 38* 34* 31*  CREATININE 1.59* 1.71* 1.60*  CALCIUM 9.2 9.5 9.1  MG 1.9 1.9 1.7  PHOS 3.3  --  3.3  PROT 7.5  --  7.6  ALBUMIN 2.3*  --  2.4*  AST 121*  --  63*  ALT 184*  --  133*  ALKPHOS 100  --  106  BILITOT 1.6*  --  1.3*  BILIDIR 0.8*  --   --   IBILI 0.8  --   --   TRIG  --   --  155*   Estimated Creatinine Clearance: 56.4 ml/min (by C-G formula based on Cr of 1.6).    Recent Labs  01/17/14 1738 01/17/14 2341 01/18/14 0517  GLUCAP 114* 140* 133*    Medical History: Past Medical History  Diagnosis Date  . Hypertension     Dr. Angela Nevin Homes (PCP)  . Hypercholesterolemia   . Stroke 2011    at [facility] sex  . SBO (small bowel obstruction) 01/07/2014    Medications:  Prescriptions prior to admission  Medication Sig Dispense Refill  . co-enzyme Q-10 30 MG capsule Take 30 mg by mouth daily.      Marland Kitchen lisinopril (PRINIVIL,ZESTRIL) 20 MG tablet Take 30-40 mg by mouth daily.      . Multiple Vitamin (MULTIVITAMIN WITH MINERALS) TABS tablet Take 1 tablet by mouth daily.      .  naproxen sodium (ANAPROX) 220 MG tablet Take 440 mg by mouth daily as needed (for pain).      . Omega-3 Fatty Acids (FISH OIL) 1000 MG CAPS Take 1,000 mg by mouth daily.      Marland Kitchen omeprazole (PRILOSEC) 20 MG capsule Take 20 mg by mouth daily.      . simvastatin (ZOCOR) 20 MG tablet Take 20 mg by mouth daily at 6 PM.        Insulin Requirements in the past 24 hours:  0 units of SSI since 1800  Current Nutrition:  Clinimix E 5/15 at 83 ml/hr + Lipids 20% at 10 mL/hr- provides 100g protein and 1894 kcal daily  Nutritional Goals:  2200 - 2400 Kcal, 105-120 g Protein, 2.2-2.4 Liters per RD recommendations 4/2  Admit: Admitted with SBO, failed conservative measures and taken to OR 3/27  GI: SBO this admit - S/p ex-lap on 3/27 for SBO and found to have perforated appy and intra abd abscess, s/p appendectomy.  Remains NPO. NG tube output 122cc in last 24 hours. Pt meets criteria for severe malnutrition in the context  of acute illness and is at risk for refeeding syndrome- has been doing well so far with TPN.  Prealbumin 14.5 at baseline 3/31. Abd xray 4/3 shows improvement in SBO, possible postoperative ileus. Advanced to full liquid on 4/6. Had BM this morning.  Endo: No hx DM - CBGs at goal on SSI  Lytes: Na 143, K 4.1, corrected Ca 10.38, Mg 1.7, Phos 3.3  Renal: ARI on admit. Likely combination of prerenal azotemia and ACE-I induced ATN vs. NSAID use. SCr jumping around and now down to 1.6, Foley d/c'ed.  Pulm: 100% RA  Cards: Hx HTN, HLD - BP 127/90, HR 73 - no meds  Hepatobil: AST/ALT + Tbili down to 1.3, albumin low at 2.4, Trigs down to 155   Neuro: Hx CVA - No secondary stroke px -- f/u ASA (sticky note left on 3/27 and has not yet been addressed)  ID: Zosyn D#11 for SBO and intra-abd infection - Afebrile, WBC WNL - growing Ecoli + strep C in peritoneal fluid  Best Practices: Hatch Heparin, SCDs, MC TPN Access: Triple lumen CVC 3/29  TPN day#: 7 (started 3/30)  Plan:  1. Continue  Clinimix E 5/15 at 83 ml/hr + Lipids 20% at 10 mL/hr (Provides 1894 kcal (~86% goal); 100 gm protein (~96% goal)). TPN + full liquid diet should provide 100% of nutritional goals  2. DC SSI + CBGs - no hx of DM and has required very little SSI 3. Daily IV MVI, thiamine 100mg  and folic acid 1mg  in TPN 4. Trace elements M/W/F only due to national shortage 5. F/u AM labs 6. F/u plans for diet advancement and enteral feeding   Vinnie LevelBenjamin Myeasha Ballowe, PharmD.  Clinical Pharmacist Pager [phone]

## 2014-01-20 DIAGNOSIS — R740 Nonspecific elevation of levels of transaminase and lactic acid dehydrogenase [LDH]: Secondary | ICD-10-CM

## 2014-01-20 DIAGNOSIS — K3533 Acute appendicitis with perforation and localized peritonitis, with abscess: Secondary | ICD-10-CM

## 2014-01-20 DIAGNOSIS — R7402 Elevation of levels of lactic acid dehydrogenase (LDH): Secondary | ICD-10-CM

## 2014-01-20 LAB — BASIC METABOLIC PANEL
BUN: 27 mg/dL — ABNORMAL HIGH (ref 6–23)
CO2: 23 meq/L (ref 19–32)
Calcium: 8.9 mg/dL (ref 8.4–10.5)
Chloride: 103 mEq/L (ref 96–112)
Creatinine, Ser: 1.49 mg/dL — ABNORMAL HIGH (ref 0.50–1.35)
GFR calc Af Amer: 57 mL/min — ABNORMAL LOW (ref >90)
GFR calc non Af Amer: 49 mL/min — ABNORMAL LOW (ref >90)
Glucose, Bld: 127 mg/dL — ABNORMAL HIGH (ref 70–99)
POTASSIUM: 4 meq/L (ref 3.7–5.3)
SODIUM: 140 meq/L (ref 137–147)

## 2014-01-20 LAB — CBC
HEMATOCRIT: 21.9 % — AB (ref 39.0–52.0)
Hemoglobin: 7.5 g/dL — ABNORMAL LOW (ref 13.0–17.0)
MCH: 32.6 pg (ref 26.0–34.0)
MCHC: 34.2 g/dL (ref 30.0–36.0)
MCV: 95.2 fL (ref 78.0–100.0)
PLATELETS: 548 10*3/uL — AB (ref 150–400)
RBC: 2.3 MIL/uL — AB (ref 4.22–5.81)
RDW: 14.7 % (ref 11.5–15.5)
WBC: 6.6 10*3/uL (ref 4.0–10.5)

## 2014-01-20 LAB — HEPATIC FUNCTION PANEL
ALBUMIN: 2.3 g/dL — AB (ref 3.5–5.2)
ALK PHOS: 98 U/L (ref 39–117)
ALT: 124 U/L — ABNORMAL HIGH (ref 0–53)
AST: 61 U/L — AB (ref 0–37)
Bilirubin, Direct: 0.5 mg/dL — ABNORMAL HIGH (ref 0.0–0.3)
Indirect Bilirubin: 0.6 mg/dL (ref 0.3–0.9)
TOTAL PROTEIN: 7.3 g/dL (ref 6.0–8.3)
Total Bilirubin: 1.1 mg/dL (ref 0.3–1.2)

## 2014-01-20 MED ORDER — OXYCODONE-ACETAMINOPHEN 5-325 MG PO TABS
1.0000 | ORAL_TABLET | ORAL | Status: DC | PRN
  Administered 2014-01-20 – 2014-01-21 (×2): 2 via ORAL
  Filled 2014-01-20 (×2): qty 2

## 2014-01-20 MED ORDER — HYDROMORPHONE HCL PF 1 MG/ML IJ SOLN
INTRAMUSCULAR | Status: AC
  Filled 2014-01-20: qty 1

## 2014-01-20 MED ORDER — AMOXICILLIN-POT CLAVULANATE 875-125 MG PO TABS
1.0000 | ORAL_TABLET | Freq: Two times a day (BID) | ORAL | Status: DC
Start: 2014-01-20 — End: 2014-01-21
  Administered 2014-01-20 – 2014-01-21 (×3): 1 via ORAL
  Filled 2014-01-20 (×4): qty 1

## 2014-01-20 MED ORDER — TRACE MINERALS CR-CU-F-FE-I-MN-MO-SE-ZN IV SOLN: INTRAVENOUS | Status: DC

## 2014-01-20 MED ORDER — OXYCODONE-ACETAMINOPHEN 5-325 MG PO TABS
1.0000 | ORAL_TABLET | ORAL | Status: DC | PRN
  Administered 2014-01-20: 1 via ORAL
  Filled 2014-01-20: qty 1

## 2014-01-20 MED ORDER — HYDROMORPHONE HCL PF 1 MG/ML IJ SOLN
1.0000 mg | Freq: Once | INTRAMUSCULAR | Status: AC
  Administered 2014-01-20: 1 mg via INTRAVENOUS

## 2014-01-20 MED ORDER — FAT EMULSION 20 % IV EMUL: 250.0000 mL | INTRAVENOUS | Status: DC

## 2014-01-20 NOTE — Progress Notes (Signed)
Subjective:  [name] is doing well with no significant overnight events. He reports that he continues to have loose bowel movements and is urinating without pain or burning. He continues to ambulate daily. [name] denies chest pain, sob, heart fluttering and states that his pain has improved significantly and is not bothering him as much.   Objective: Vital signs in last 24 hours: Filed Vitals:   01/19/14 0524 01/19/14 1339 01/19/14 2203 01/20/14 0612  BP: 127/90 133/87 131/82 143/88  Pulse: 73 79 74 65  Temp: 98.7 F (37.1 C) 98.4 F (36.9 C) 98.5 F (36.9 C) 98.6 F (37 C)  TempSrc: Oral Oral Oral Oral  Resp: 16 17 16 16  Height:      Weight:    90.538 kg (199 lb 9.6 oz)  SpO2: 100% 100% 99% 100%   Weight change: -1.062 kg (-2 lb 5.5 oz)  Intake/Output Summary (Last 24 hours) at 01/20/14 0851 Last data filed at 01/20/14 0500  Gross per 24 hour  Intake 2153.72 ml  Output   2608 ml  Net -454.28 ml   Physical Exam: General: Resting comfortably in his seat. In no acute distress HEENT: PEERL, MMM, small wound on nose tip from NG tube adhesive Neck: Supple, no lymphadenopathy or tenderness Cardio: RRR, No M/R/G Pulm: Lungs clear to auscultation bilaterally Abdominal: Soft, Active bowel sounds, slightly tender to palpation, drain shows minimal serous fluid, mid-line incision with staples and no erythema, swelling, or pus. Extremities: 2+ pedal and radial pulses, no edema present Neuro: Alert and Oriented x3  Lab Results: @LABTEST2@ Micro Results: No results found for this or any previous visit (from the past 240 hour(s)). Studies/Results: No results found. Medications:  Scheduled: . amoxicillin-clavulanate  1 tablet Oral Q12H  . antiseptic oral rinse  15 mL Mouth Rinse q12n4p  . chlorhexidine  15 mL Mouth Rinse BID  . heparin  5,000 Units Subcutaneous 3 times per day   Continuous: . .TPN (CLINIMIX-E) Adult     And  . fat emulsion     PRN:HYDROmorphone  (DILAUDID) injection, morphine injection, ondansetron, oxyCODONE-acetaminophen, sodium chloride Scheduled Meds: . amoxicillin-clavulanate  1 tablet Oral Q12H  . antiseptic oral rinse  15 mL Mouth Rinse q12n4p  . chlorhexidine  15 mL Mouth Rinse BID  . heparin  5,000 Units Subcutaneous 3 times per day   Continuous Infusions: . .TPN (CLINIMIX-E) Adult     And  . fat emulsion     PRN Meds:.HYDROmorphone (DILAUDID) injection, morphine injection, ondansetron, oxyCODONE-acetaminophen, sodium chloride  Assessment/Plan:The patient is a 63 yo man, presenting with abdominal pain, nausea, and vomiting, found to have a small bowel obstruction and subsequently ruptured appendicitis and is s/p ex-lap.  Principal Problem:   Ruptured appendicitis Active Problems:   Small bowel obstruction   Acute kidney injury   High anion gap metabolic acidosis   Hyponatremia   Hyperbilirubinemia   Transaminitis   Anemia  #Acute small bowel obstruction and ruptured appendicitis with intraabdominal abscess: POD#11 s/p ex lap and lysis of adhesions 2/2 ruptured appendicitis and SBO. Peritoneal fluid culture collected in the OR is growing rare E. Coli, moderate streptococcus group C. Anaerobic cultures are no growth. Surgical pathology of the appendix shows acute supperative appendicitis. Pt with resolution of post-op ileus. NG tube removed 4/4, advancing diet. Discontinuing TPN. Finished 11 days of Zosyn and transitioned to Augmentin.  - Appreciate surgery recs  - Advance to soft diet - Ween off of TPN - Discontinue Dilaudid 0.5-1mg q2h prn   and morphine 2mg q4h prn severe pain, transition to percocet 5-325mg Q4hr prn - Zofran q6hr prn nausea  - Discontinue Zosyn IV, transition to oral augmentin - Incentive spirometry  - Mobilize OOB, PT eval and treat, OT eval and treat   #Acute on chronic anemia: Likely dilutional plus acute blood loss anemia, to be expected s/p abdominal surgery. Also iatrogenic from serial  blood draws. Patient has a history of macrocytic anemia of unknown origin that was worked up extensively at [facility] (records are in [facility]). Nutritional markers were normal and he even had a bone marrow biopsy that was unremarkable. No gross bleeding. NGT output is bilious. Smear 4/2 showed a target cells and slight polychromasia. Haptoglobin NOT low. LDH 324 (elevated). Iron panel below. Iron normal, ferritin increased (acute phase reactant). Retic % normal. Improved today.  - Continue to monitor  - Transfusion threshold Hgb <7   Hemoglobin & Hematocrit     Component Value Date/Time   HGB 7.5* 01/20/2014 0510   HCT 21.9* 01/20/2014 0510   Iron/TIBC/Ferritin    Component  Value  Date/Time    IRON  66  01/15/2014 1130    TIBC  211*  01/15/2014 1130    FERRITIN  937*  01/15/2014 1130    #Acute kidney injury: Presented with a Cr of 8.62, from his last known baseline of 1.2. This likely represented a combination of prerenal azotemia and ATN. Cr downtrended with IVF resuscitation, stabilized at ~3.5 after his surgery, and continues trending down again since POD#5. Suspect resolving pre-renal azotemia from insensible losses during surgery/NPO +/- ATN. CK elevated at 2764 on 3/31 (it was 265 on admission), CK trended and rhabdomyolysis resolving. Foley was discontinued, and he continues urinating normally.  - Advance to soft diet - Holding nephrotoxic meds  - Trend BMPs  - Changing DVT ppx from Lovenox to Heparin given slight Cr bump overnight   Recent Labs  Lab  01/14/14 0324  01/15/14 0310  01/16/14 0335  01/17/14 0545  01/18/14 0515   CREATININE  2.88*  2.35*  1.92*  1.59*  1.71*    #Transaminitis and resolving hyperbilirubinemia: Total bilirubin peaked at 7.3 on 3/26. Mostly direct bilirubinemia. LFTs were only mildly elevated at that time. Bili has trended down, but then AST/ALT rose and plateaued; thought to be 2/2 hypoperfusion from hypovolemia. His ALT increased to 184 on 4/4,  possibly 2/2 TPN. Hepatitis panel negative. HIV NR. RUQ [place] with no obvious obstruction.  - am CMP  Hepatic Function Panel    Component  Value  Date/Time    PROT  7.6  01/19/2014 0740    ALBUMIN  2.4*  01/19/2014 0740    AST  63*  01/19/2014 0740    ALT  133*  01/19/2014 0740    ALKPHOS  106  01/19/2014 0740    BILITOT  1.3*  01/19/2014 0740    BILIDIR  0.8*  01/17/2014 0545    IBILI  0.8  01/17/2014 0545    #Hypernatremia/hyperchloremia, resolved: Likely iatrogenic from aggressive IVF resuscitation combined with NGT losses and insensible losses from surgery. Steady trend down to normal after aggressive D5W infusion.  - Stop IVF - D/C TPN today - Advance to soft diet - I/Os > -1794cc since admission  - Monitor BMP daily   #Refeeding: Dietitian and pharmacy consulted for TPN given prolonged n.p.o. status. He has an IJ central line in correct position per PCCM.  - Appreciate nutrition and pharmacy recs - D/C TPN per   surgery - Discontinue CBG monitoring q6h and sensitive SSI coverage per pharmacists rec  - IV Multivitamin, thiamine, folic acid daily in TPN  - Trace elements M/W/F due to national shortage  - Discontinued TPN labs (including monitor magnesium, potassium, and phosphorus daily for at least 3 days)   #Elevated anion gap: Resolved. Likely secondary to uremia vs. starvation ketoacidosis. Serial lactates have been normal.  - IVF as above  - Trend BMET   #Alcohol use: The patient was drinking 1-3 craft beers/day x 5 days per week. His last drink was on St. Patrick's Day (March 17). Withdrawal unlikely at this point. CIWA d/c'd.  - Thiamine, folate, MV via TPN  #DVT PPx: Heparin subq, SCDs   Dispo: Disposition is deferred at this time, awaiting improvement of current medical problems. Anticipated discharge in approximately 2-3 day(s).  The patient does have a current PCP (Dionne Volanda Napoleon, MD) and does not need an [facility] hospital follow-up appointment after discharge.  The patient does  not have transportation limitations that hinder transportation to clinic appointments.  .Services Needed at time of discharge: Y = Yes, Blank = No  PT:  No PT follow up;Supervision - Intermittent   OT:  No OT follow up;Supervision - Intermittent (needs to follow up for outpt therapy s/p RTC repair)   RN:    Equipment:  Rolling walker with 5" wheels   Other:      This is a Careers information officer Note.  The care of the patient was discussed with Dr. Lucila Maine and the assessment and plan formulated with their assistance.  Please see their attached note for official documentation of the daily encounter.   LOS: 13 days   Durene Fruits, Med Student 01/20/2014, 8:51 AM

## 2014-01-20 NOTE — Progress Notes (Signed)
Internal Medicine Attending  Date: 01/20/2014  Patient name: Lonnie MaserCarl Henry Medical record number: [id number] Date of birth: 1951/01/10 Age: 63 y.o. Gender: male  I saw and evaluated the patient on A.M rounds, and I discussed his care with housestaff.  I reviewed the resident's note by Dr. Claudell Kyleater and I agree with the resident's findings and plans as documented in her note.

## 2014-01-20 NOTE — Progress Notes (Signed)
Seen and agreed 01/20/2014 Robinette, Julia Elizabeth PTA [phone] pager [phone] office    

## 2014-01-20 NOTE — Progress Notes (Signed)
  I have seen and examined the patient, and reviewed the daily progress note by Camille BalJimmy Chen, MS 3 and discussed the care of the patient with them. Please see my progress note from 01/20/2014 for further details regarding assessment and plan.    Signed:  Vivi BarrackSarah Jilliann Subramanian, MD 01/20/2014, 12:39 PM

## 2014-01-20 NOTE — Progress Notes (Signed)
Seen, agree with above. Ileus resolving after perforated appendicitis/ex lap. Advance diet.   Possibly home tomorrow.

## 2014-01-20 NOTE — Progress Notes (Signed)
Patient ID: Lonnie MaserCarl Henry, male   DOB: 1951/09/30, 63 y.o.   MRN: [id number] 11 Days Post-Op  Subjective: Pt doing well today.  Tolerating full liquids.  Still moving bowels.  Pain well controlled.  Objective: Vital signs in last 24 hours: Temp:  [98.4 F (36.9 C)-98.6 F (37 C)] 98.6 F (37 C) (04/07 0612) Pulse Rate:  [65-79] 65 (04/07 0612) Resp:  [16-17] 16 (04/07 0612) BP: (131-143)/(82-88) 143/88 mmHg (04/07 0612) SpO2:  [99 %-100 %] 100 % (04/07 0612) Weight:  [199 lb 9.6 oz (90.538 kg)] 199 lb 9.6 oz (90.538 kg) (04/07 0612) Last BM Date: 01/20/14  Intake/Output from previous day: 04/06 0701 - 04/07 0700 In: 2153.7 [NWG:9562.1][TPN:2153.7] Out: 2608 [Urine:2600; Drains:8] Intake/Output this shift:    PE: Abd: soft, appropriately tender, +BS, ND, incision c/d/i  Lab Results:   Recent Labs  01/19/14 0740 01/20/14 0510  WBC 9.0 6.6  HGB 8.1* 7.5*  HCT 23.9* 21.9*  PLT 535* 548*   BMET  Recent Labs  01/19/14 0740 01/20/14 0510  NA 143 140  K 4.1 4.0  CL 104 103  CO2 24 23  GLUCOSE 141* 127*  BUN 31* 27*  CREATININE 1.60* 1.49*  CALCIUM 9.1 8.9   PT/INR No results found for this basename: LABPROT, INR,  in the last 72 hours CMP     Component Value Date/Time   NA 140 01/20/2014 0510   K 4.0 01/20/2014 0510   CL 103 01/20/2014 0510   CO2 23 01/20/2014 0510   GLUCOSE 127* 01/20/2014 0510   BUN 27* 01/20/2014 0510   CREATININE 1.49* 01/20/2014 0510   CALCIUM 8.9 01/20/2014 0510   PROT 7.3 01/20/2014 0510   ALBUMIN 2.3* 01/20/2014 0510   AST 61* 01/20/2014 0510   ALT 124* 01/20/2014 0510   ALKPHOS 98 01/20/2014 0510   BILITOT 1.1 01/20/2014 0510   GFRNONAA 49* 01/20/2014 0510   GFRAA 57* 01/20/2014 0510   Lipase     Component Value Date/Time   LIPASE 24 01/06/2014 2345       Studies/Results: No results found.  Anti-infectives: Anti-infectives   Start     Dose/Rate Route Frequency Ordered Stop   01/20/14 1000  amoxicillin-clavulanate (AUGMENTIN) 875-125 MG per tablet 1 tablet      1 tablet Oral Every 12 hours 01/20/14 0810 01/22/14 0959   01/09/14 1315  piperacillin-tazobactam (ZOSYN) IVPB 3.375 g     3.375 g 100 mL/hr over 30 Minutes Intravenous STAT 01/09/14 1300 01/09/14 1733   01/08/14 2300  piperacillin-tazobactam (ZOSYN) IVPB 3.375 g  Status:  Discontinued     3.375 g 12.5 mL/hr over 240 Minutes Intravenous Every 8 hours 01/08/14 1447 01/20/14 0810   01/08/14 1500  piperacillin-tazobactam (ZOSYN) IVPB 3.375 g     3.375 g 100 mL/hr over 30 Minutes Intravenous  Once 01/08/14 1447 01/08/14 1540       Assessment/Plan  1. POD 11, s/p ex lap for SBO with appendectomy and drainage of abscess 2. PCM/TNA 3. Post op ileus, resolving  Plan: 1. Patient doing well.  Will wean TNA to off today since tolerating his diet. 2. Advance to soft diet 3. Dc zosyn and start augmentin.  Today is D13.  He just needs today and tomorrow, then he has completed his course. 4. Change to oral pain meds 5. Cont to mobilize 6. Hopefully home tomorrow, from our standpoint. 7. Dc staples  LOS: 13 days    Chalsey Leeth E 01/20/2014, 8:11 AM Pager: [phone]

## 2014-01-20 NOTE — Progress Notes (Signed)
Physical Therapy Treatment Patient Details Name: Lonnie MaserCarl Gohr MRN: [id number] DOB: [birth year] Today's Date: 01/20/2014    History of Present Illness Adm 3/25 and underwent Exploratory laparotomy, LOA, appendectomy and drainage of abscess(01/09/14)      PT Comments    Patient continues to be motivated to return to activity. Patient tolerates treatment well. Continue to promote gait training with patient. No PT follow up at d/c at this time.   Follow Up Recommendations  No PT follow up;Supervision - Intermittent     Equipment Recommendations  Rolling walker with 5" wheels    Recommendations for Other Services       Precautions / Restrictions Precautions Precautions: None Precaution Comments: NG tube; multiple lines; abdominal drain Required Braces or Orthoses: Other Brace/Splint Other Brace/Splint: abd binder Restrictions Weight Bearing Restrictions: No    Mobility  Bed Mobility Overal bed mobility: Modified Independent             General bed mobility comments: Increased time due to lines  Transfers Overall transfer level: Modified independent Equipment used: Rolling walker (2 wheeled) Transfers: Sit to/from Stand Sit to Stand: Min guard         General transfer comment: Cues for upright posture  Ambulation/Gait Ambulation/Gait assistance: Min guard Ambulation Distance (Feet): 600 Feet Assistive device: Rolling walker (2 wheeled) Gait Pattern/deviations: Trunk flexed;Step-through pattern     General Gait Details: Patient required cues for upright posture.   Stairs Stairs: Yes Stairs assistance: Min guard Stair Management: One rail Right;One rail Left (Rail on right to ascend, rail on left to descend) Number of Stairs: 3 General stair comments: Patient able to ascend and descend 3 stairs x 2 with good technique  Wheelchair Mobility    Modified Rankin (Stroke Patients Only)       Balance Overall balance assessment: No apparent balance deficits  (not formally assessed)                                  Cognition Arousal/Alertness: Awake/alert Behavior During Therapy: WFL for tasks assessed/performed Overall Cognitive Status: Within Functional Limits for tasks assessed                      Exercises      General Comments        Pertinent Vitals/Pain Patient denies pain at this time.    Home Living                      Prior Function            PT Goals (current goals can now be found in the care plan section) Progress towards PT goals: Progressing toward goals    Frequency  Min 3X/week    PT Plan Current plan remains appropriate    Co-evaluation             End of Session Equipment Utilized During Treatment: Gait belt Activity Tolerance: Patient tolerated treatment well Patient left: in chair;with call bell/phone within reach     Time: 0802-0830 PT Time Calculation (min): 28 min  Charges:                       G Codes:      Oyindamola Key 01/20/2014, 9:25 AM

## 2014-01-20 NOTE — Progress Notes (Signed)
Subjective: Patient seen and examined at the bedside this morning. He tolerated a full liquid diet and is moving his bowels. He is working with PT. They are removing his abdominal staples at the bedside right now. He is having a little pain with this. Overall however his pain is well controlled.  Objective: Vital signs in last 24 hours: Filed Vitals:   01/19/14 0524 01/19/14 1339 01/19/14 2203 01/20/14 0612  BP: 127/90 133/87 131/82 143/88  Pulse: 73 79 74 65  Temp: 98.7 F (37.1 C) 98.4 F (36.9 C) 98.5 F (36.9 C) 98.6 F (37 C)  TempSrc: Oral Oral Oral Oral  Resp: _0 Height:      Weight:    199 lb 9.6 oz (90.538 kg)  SpO2: 100% 100% 99% 100%   Weight change: -2 lb 5.5 oz (-1.062 kg)  Intake/Output Summary (Last 24 hours) at 01/20/14 1128 Last data filed at 01/20/14 1031  Gross per 24 hour  Intake 2153.72 ml  Output   2483 ml  Net -329.28 ml   Physical Exam Vitals reviewed. General: Sitting up in a chair, NAD HEENT: PERRL, EOMI, no scleral icterus.   Cardiac: RRR Pulm: Respirations equal Abd: Soft, non-distended, mild TTP in RLQ, incision c/d/i, Drain with scant serous drainage Ext: Moving all 4 extremities, no appreciable edema Neuro: Alert and oriented X3, CN 2-12 grossly intact, non-focal  Lab Results: Basic Metabolic Panel:  Recent Labs Lab 01/17/14 0545 01/18/14 0515 01/19/14 0740 01/20/14 0510  NA 147 146 143 140  K 4.5 4.0 4.1 4.0  CL 108 107 104 103  CO2 _1 GLUCOSE 129* 144* 141* 127*  BUN 38* 34* 31* 27*  CREATININE 1.59* 1.71* 1.60* 1.49*  CALCIUM 9.2 9.5 9.1 8.9  MG 1.9 1.9 1.7  --   PHOS 3.3  --  3.3  --    Liver Function Tests:  Recent Labs Lab 01/19/14 0740 01/20/14 0510  AST 63* 61*  ALT 133* 124*  ALKPHOS 106 98  BILITOT 1.3* 1.1  PROT 7.6 7.3  ALBUMIN 2.4* 2.3*   CBC:  Recent Labs Lab 01/19/14 0740 01/20/14 0510  WBC 9.0 6.6  NEUTROABS 7.2  --   HGB 8.1* 7.5*  HCT 23.9* 21.9*  MCV 96.0 95.2    PLT 535* 548*   Cardiac Enzymes:  Recent Labs Lab 01/14/14 0324 01/15/14 0310 01/16/14 0335  CKTOTAL 1619* 1046* 679*   CBG:  Recent Labs Lab 01/17/14 0547 01/17/14 0610 01/17/14 1144 01/17/14 1738 01/17/14 2341 01/18/14 0517  GLUCAP 112* 123* 122* 114* 140* 133*    Micro Results: No results found for this or any previous visit (from the past 240 hour(s)). Studies/Results: No results found. Medications: I have reviewed the patient's current medications. Scheduled Meds: . amoxicillin-clavulanate  1 tablet Oral Q12H  . antiseptic oral rinse  15 mL Mouth Rinse q12n4p  . chlorhexidine  15 mL Mouth Rinse BID  . heparin  5,000 Units Subcutaneous 3 times per day   Continuous Infusions: . Marland KitchenTPN (CLINIMIX-E) Adult Stopped (01/20/14 1112)   And  . fat emulsion Stopped (01/20/14 1113)   PRN Meds:.HYDROmorphone (DILAUDID) injection, morphine injection, ondansetron, oxyCODONE-acetaminophen, sodium chloride  Assessment/Plan: The patient is a 63 yo man, presenting with abdominal pain, nausea, and vomiting, found to have a small bowel obstruction and subsequently ruptured appendicitis and is s/p ex-lap.  #Acute small bowel obstruction and ruptured appendicitis with intraabdominal abscess: POD#11 s/p ex lap and lysis of adhesions  2/2 ruptured appendicitis and SBO. Peritoneal fluid culture collected in the OR is growing rare E. Coli, moderate streptococcus group C. Anaerobic cultures are no growth. Surgical pathology of the appendix shows acute supperative appendicitis. Patient now with resolution of post-op ileus. NG tube removed 4/4, advancing diet.  - Appreciate surgery recs  - Advance diet to soft - Will touch base with pharmacy about weaning off TPN today - Pain control with Percocet 1-2 tabs q3h prn (stop IV pain meds) - Zofran q6hr prn nausea - Stop Zosyn IV q8h, transition antibiotics to oral regimen of Augmentin q12h to treat intraabdominal infection (antibiotic day  13/14) - Incentive spirometry  - Mobilize OOB, PT eval and treat, OT eval and treat - He may be able to go home tomorrow  #Acute on chronic anemia: Stable. Likely dilutional plus acute blood loss anemia s/p abdominal surgery. Also iatrogenic from serial blood draws.  Patient has a history of macrocytic anemia of unknown origin that was worked up extensively at [facility] (records are in [place]). Nutritional markers were normal and he even had a bone marrow biopsy that was unremarkable. No gross bleeding. NGT output is bilious. Smear 4/2 showed a target cells and slight polychromasia. Haptoglobin NOT low. LDH 324 (elevated). Iron panel below. Iron normal, ferritin increased (acute phase reactant). Retic % normal. - Continue to monitor - Transfusion threshold Hgb <7  Recent Labs Lab 01/16/14 0335 01/17/14 0545 01/18/14 0515 01/19/14 0740 01/20/14 0510  HGB 7.5* 7.5* 8.3* 8.1* 7.5*    Iron/TIBC/Ferritin    Component Value Date/Time   IRON 66 01/15/2014 1130   TIBC 211* 01/15/2014 1130   FERRITIN 937* 01/15/2014 1130    #Acute kidney injury: Presented with a Cr of 8.62, from his last known baseline of 1.2. This likely represented a combination of prerenal azotemia and ATN. Cr downtrended with IVF resuscitation, stabilized at ~3.5 after his surgery, and continues trending down again since POD#5. Suspect resolving pre-renal azotemia from insensible losses during surgery/NPO +/- ATN. CK elevated at 2764 on 3/31 (it was 265 on admission), CK trended and rhabdomyolysis resolving. Foley was discontinued, and he is urinating normally. - Holding nephrotoxic meds  - Trend BMPs   Recent Labs Lab 01/16/14 0335 01/17/14 0545 01/18/14 0515 01/19/14 0740 01/20/14 0510  CREATININE 1.92* 1.59* 1.71* 1.60* 1.49*    #Transaminitis and resolving hyperbilirubinemia: Total bilirubin peaked at 7.3 on 3/26. Mostly direct bilirubinemia. LFTs were only mildly elevated at that time. Bili has trended  down, but then AST/ALT rose and plateaued; thought to be 2/2 hypoperfusion from hypovolemia. His ALT increased to 184 on 4/4, possibly 2/2 TPN. It is now down trending.  Hepatitis panel negative. HIV NR. RUQ [place] with no obvious obstruction. - Trend CMPs  Hepatic Function Panel     Component Value Date/Time   PROT 7.3 01/20/2014 0510   ALBUMIN 2.3* 01/20/2014 0510   AST 61* 01/20/2014 0510   ALT 124* 01/20/2014 0510   ALKPHOS 98 01/20/2014 0510   BILITOT 1.1 01/20/2014 0510   BILIDIR 0.5* 01/20/2014 0510   IBILI 0.6 01/20/2014 0510     #Hypernatremia/hyperchloremia, resolved: Likely iatrogenic from aggressive IVF resuscitation combined with NGT losses and insensible losses from surgery. Steady trend down to normal after aggressive D5W infusion, now tolerating a diet. - I/Os > -2524cc since admission - Monitor BMP daily  #Refeeding: Dietitian and pharmacy consulted for TPN given prolonged n.p.o. status. He has an IJ central line in correct position per PCCM. - Appreciate  nutrition and pharmacy recs - TPN per pharmacy, will communicate with them about weaning today since tolerating diet - IV Multivitamin, thiamine, folic acid daily in TPN - Trace elements M/W/F due to national shortage  - TPN labs (including monitor magnesium, potassium, and phosphorus daily for at least 3 days)  #Elevated anion gap: Resolved. Likely secondary to uremia vs. starvation ketoacidosis. Serial lactates have been normal. - IVF as above  - Trend BMET    #Alcohol use: The patient was drinking 1-3 craft beers/day x 5 days per week. His last drink was on St. Patrick's Day (March 17). Withdrawal unlikely at this point. CIWA d/c'd. - Thiamine, folate, MV via TPN  #DVT PPx: Heparin subq, SCDs   Dispo: Disposition is deferred at this time, awaiting improvement of current medical problems.  Anticipated discharge in approximately 2-3 day(s).    The patient does have a current PCP (Dionne Volanda Napoleon, MD) and does not need an  [facility] hospital follow-up appointment after discharge.  The patient does not have transportation limitations that hinder transportation to clinic appointments.  .Services Needed at time of discharge: Y = Yes, Blank = No PT: No PT follow up;Supervision - Intermittent  OT: No OT follow up;Supervision - Intermittent (needs to follow up for outpt therapy s/p RTC repair)   RN:   Equipment: Rolling walker with 5" wheels   Other:      LOS: 13 days   Lesly Dukes, MD 01/20/2014, 11:28 AM   Lesly Dukes, MD  [email or web address] Pager # [phone] Office # [phone]

## 2014-01-20 NOTE — Discharge Summary (Signed)
Name: Lonnie Henry MRN: [id number] DOB: 12-12-1950 63 y.o. PCP: Jodelle Gross, MD  Date of Admission: 01/07/2014  1:46 AM Date of Discharge: 01/21/2014 Attending Physician: Axel Filler, MD  Discharge Diagnosis: Principal Problem:   Ruptured appendicitis Active Problems:   Small bowel obstruction   Acute kidney injury   High anion gap metabolic acidosis   Hyponatremia   Hyperbilirubinemia   Transaminitis   Anemia  Discharge Medications:   Medication List         co-enzyme Q-10 30 MG capsule  Take 30 mg by mouth daily.     Fish Oil 1000 MG Caps  Take 1,000 mg by mouth daily.     lisinopril 20 MG tablet  Commonly known as:  PRINIVIL,ZESTRIL  Take 30-40 mg by mouth daily.     multivitamin with minerals Tabs tablet  Take 1 tablet by mouth daily.     naproxen sodium 220 MG tablet  Commonly known as:  ANAPROX  Take 440 mg by mouth daily as needed (for pain).     omeprazole 20 MG capsule  Commonly known as:  PRILOSEC  Take 20 mg by mouth daily.     ondansetron 4 MG tablet  Commonly known as:  ZOFRAN  Take 1 tablet (4 mg total) by mouth every 8 (eight) hours as needed for nausea or vomiting.     oxyCODONE-acetaminophen 5-325 MG per tablet  Commonly known as:  PERCOCET/ROXICET  Take 1-2 tablets by mouth every 6 (six) hours as needed for moderate pain.     simvastatin 20 MG tablet  Commonly known as:  ZOCOR  Take 20 mg by mouth daily at 6 PM.        Disposition and follow-up:   Mr.Hudson Cazarez was discharged from [facility] in Stable condition.  At the hospital follow up visit please address:  1.  Pain control. Ileus symptoms. Readiness to go back to work.  2.  Labs / imaging needed at time of follow-up: CBC, CMP  3.  Pending labs/ test needing follow-up: None  Follow-up Appointments: Follow-up Information   Follow up with HOLMES, DIONNE NATALIE On 01/30/2014. (@ 2pm for Hospital Follow up . This is your PCP.)    Specialty:   Internal Medicine   Contact information:   [place] [address] [phone]       Follow up with Zenovia Jarred, MD On 01/30/2014. (@ 11am for Hospital Follow up. This is Engineer, production.)    Specialty:  General Surgery   Contact information:   [place] [address] [phone]       Discharge Instructions: Discharge Orders   Future Appointments Provider Department Dept Phone   01/30/2014 11:20 AM Zenovia Jarred, MD [facility], [place] [phone]   Future Orders Complete By Expires   Call MD for:  persistant nausea and vomiting  As directed    Call MD for:  redness, tenderness, or signs of infection (pain, swelling, redness, odor or green/yellow discharge around incision site)  As directed    Call MD for:  temperature >100.4  As directed    Diet - low sodium heart healthy  As directed    Increase activity slowly  As directed       Consultations: Treatment Team:  Md Ccs, MD  Procedures Performed:  Ct Abdomen Pelvis Wo Contrast  01/07/2014   CLINICAL DATA:  Abdominal tenderness and guarding.  EXAM: CT ABDOMEN AND PELVIS  WITHOUT CONTRAST  TECHNIQUE: Multidetector CT imaging of the abdomen and pelvis was performed following the standard protocol without intravenous contrast.  COMPARISON:  No priors.  FINDINGS: Lung Bases: Dependent subsegmental atelectasis in the lower lobes of the lungs bilaterally (right greater than left). Fluid filled patulous esophagus.  Abdomen/Pelvis: The unenhanced appearance of the liver, gallbladder, pancreas, spleen, bilateral adrenal glands and the right kidney is unremarkable. Probable extra renal pelvis in the left kidney. Left kidney is otherwise unremarkable in appearance.  Profound dilatation of the small bowel with multiple air-fluid levels. Small bowel loops measure up to 6.7 cm in diameter. Stomach also appears distended. Distal small bowel appears decompressed, as does the colon. An exact  transition point is not confidently identified on today's non contrast CT examination. Trace volume of ascites. No pneumoperitoneum. No definite lymphadenopathy identified within the abdomen or pelvis on today's non contrast CT examination.  Musculoskeletal: There are no aggressive appearing lytic or blastic lesions noted in the visualized portions of the skeleton.  IMPRESSION: 1. Small bowel obstruction. An exact transition point is not confidently identified on today's non contrast CT examination. 2. Trace volume of ascites. 3. No pneumoperitoneum.   Electronically Signed   By: Vinnie Langton M.D.   On: 01/07/2014 06:56   Dg Chest Port 1 View  01/11/2014   CLINICAL DATA:  Central line placement  EXAM: PORTABLE CHEST - 1 VIEW  COMPARISON:  Prior acute abdominal series and chest x-ray 01/07/2014  FINDINGS: Interval placement of a right IJ approach central venous catheter. The catheter tip projects over the mid SVC. A nasogastric tube is been placed, the tip lies off the field of view but below the diaphragm and likely within the stomach or proximal small bowel. Increasing right lower lobe opacity favored to reflect progressive atelectasis. Cardiac and mediastinal contours remain unchanged. No evidence of pneumothorax. No acute osseous abnormality.  IMPRESSION: 1. The tip of the new right IJ central venous catheter projects over the mid SVC. No evidence of complicating pneumothorax. 2. A nasogastric tube is present. The tip lies below the diaphragm off the field of view presumably within the stomach or proximal small bowel. 3. Increasing right basilar opacity favored to reflect atelectasis. Developing infiltrate difficult to exclude.   Electronically Signed   By: Jacqulynn Cadet M.D.   On: 01/11/2014 21:35   Dg Abd 2 Views  01/09/2014   CLINICAL DATA:  Right mid to lateral abdominal pain and swelling  EXAM: ABDOMEN - 2 VIEW  COMPARISON:  CT ABD/PELV WO CM dated 01/07/2014; DG ABD ACUTE W/CHEST dated  01/07/2014  FINDINGS: There is marked gaseous distention of multiple rather featureless loops of small bowel with index loop of small bowel in the left upper abdominal quadrant measuring approximately 4.8 cm in diameter. Multiple air-fluid levels are noted on the upright radiograph. No pneumoperitoneum or pneumatosis. A minimal amount of enteric contrast is seen within the ascending colon.  Enteric tube tip and side port projects over the expected location of the gastric fundus.  Limited visualization the lower thorax demonstrates bibasilar opacities and possible trace bilateral effusions.  Degenerative change of the lumbar spine suspected though incompletely evaluated.  IMPRESSION: 1. Similar findings compatible with high-grade partial small bowel obstruction. 2. Enteric tube tip and side port projects over the gastric fundus.   Electronically Signed   By: Sandi Mariscal M.D.   On: 01/09/2014 09:14   Dg Abd Acute W/chest  01/07/2014   CLINICAL DATA:  Nausea and vomiting.  Abdominal pain.  EXAM: ACUTE ABDOMEN SERIES (ABDOMEN 2 VIEW & CHEST 1 VIEW)  COMPARISON:  No priors.  FINDINGS: Lung volumes are normal. No consolidative airspace disease. No pleural effusions. No pneumothorax. No pulmonary nodule or mass noted. Pulmonary vasculature and the cardiomediastinal silhouette are within normal limits.  Numerous dilated loops of small bowel measuring up to 6.9 cm in diameter, with multiple air-fluid levels. Paucity of colonic gas. No pneumoperitoneum.  IMPRESSION: 1. Findings, as above, highly concerning for small bowel obstruction. 2. No pneumoperitoneum at this time. 3. No radiographic evidence of acute cardiopulmonary disease.   Electronically Signed   By: Vinnie Langton M.D.   On: 01/07/2014 04:02   Dg Abd Portable 1v  01/16/2014   CLINICAL DATA:  Postop ileus.  EXAM: PORTABLE ABDOMEN - 1 VIEW  COMPARISON:  01/09/2014  FINDINGS: Enteric tube is partially visualized with tip projecting over the gastric bubble. Skin  staples project over the midline of the lower abdomen. Drain projects over the lower abdomen and pelvis. There is mild gaseous dilatation of multiple small bowel loops in the central and upper abdomen, measuring up to approximately 4.5 cm. The amount of small bowel dilatation has decreased from the prior study. No acute osseous abnormality is seen.  IMPRESSION: Interval improvement in small bowel obstruction following surgery. Residual small bowel dilatation could reflect postoperative ileus.   Electronically Signed   By: Logan Bores   On: 01/16/2014 10:38   US Abdomen Limited Ruq  01/08/2014   CLINICAL DATA:  Abdominal pain, suspect acute cholecystitis  EXAM: US ABDOMEN LIMITED - RIGHT UPPER QUADRANT  COMPARISON:  CT ABD/PELV WO CM dated 01/07/2014  FINDINGS: Gallbladder:  The gallbladder is adequately distended and contains echogenic bile or sludge. No stones are evident. There is no gallbladder wall thickening or pericholecystic fluid or positive sonographic Murphy's sign.  Common bile duct:  Diameter: 4.8 mm  Liver:  The echotexture of the liver is somewhat heterogeneous. There is no focal mass nor ductal dilation.  IMPRESSION: The gallbladder contains sludge versus radiodense bile and no evidence of stones. There is no sonographic evidence of acute cholecystitis. Further evaluation with a nuclear medicine hepatobiliary scan and gallbladder ejection fraction may be useful. The common bile duct and liver exhibit no acute abnormalities.   Electronically Signed   By: David  Martinique   On: 01/08/2014 15:05    Admission HPI:  Mr. Raatz is our 63 yo man, presenting with abdominal pain, nausea, and vomiting, found to have a small bowel obstruction and subsequently ruptured appendicitis and is s/p ex-lap. He has pmh significant for HTN, Hypercholesterolemia, and Stroke. Initially presented with nausea, vomiting, and abdominal pain. Six days prior to admission, the patient noted sudden onset of nausea, with  vomiting of clear/brownish liquid, with no blood or melena. The next day, the patient developed bilateral lower quadrant constant "aching" abdominal pain, worsened by putting pressure on the area. Since that time, the patient noted progressive worsening of symptoms, with significantly impaired PO intake during this time. The patient also noted constipation, with his last bowel movement 2 days prior to admission after taking several doses of dulcolax, with no bowel movement or passage of gas since that time. He presented to his PCP 5 days prior to admission, and was diagnosed with a viral GI, and given an unknown medication which did not improve his symptoms. The patient has colonoscopy 6 months ago was reportedly normal. On presentation to the ED, CT showed evidence of small bowel  obstruction.   Physical Exam:  Blood pressure 126/71, pulse 80, temperature 98.5 F (36.9 C), temperature source Oral, resp. rate 18, height 6' 2"  (1.88 m), weight 215 lb (97.523 kg), SpO2 98.00%.  General: alert, cooperative, appears uncomfortable HEENT: PERRL, EOMI, no significant scleral icterus noted, oropharynx clear and non-erythematous, NG tube in place Neck: supple Lungs: clear to ascultation bilaterally, normal work of respiration, no wheezes, rales, ronchi Heart: regular rate and rhythm, no murmurs, gallops, or rubs Abdomen: round, diffusely ttp though worse in RLQ and LLQ, moderately distended, +voluntary guarding, no rebound tenderness, hypoactive bowel sounds  Extremities: no cyanosis, clubbing, or edema Neurologic: alert & oriented X3, cranial nerves II-XII intact, strength grossly intact, sensation intact to light touch   Hospital Course by problem list:  1. Acute small bowel obstruction and ruptured appendicitis with intraabdominal abscess, s/p ex-lap on 3/27 On admission SBO seen on CT abdomen with noted appendicolith, but no obvious signs of appendicitis. Lead point for ileus not identified. Patient was  made NPO with NG tube placement. Morphine and Zofran started for sx management along with IVF. Started on Zosyn on hospital day 2 for signs of intra-abdominal infection, leukocytosis. Pain localized to McBurney's point on hospital day three so ex-lap was performed and found a ruptured appendicitis, SBO, and lysis of adhesions was performed. Started on PCA pump for pain management, dilaudid and morphine, TPN given prolonged NPO status, and began wearing an abdominal binder. IV Tylenol was added for pain control on POD4. Peritoneal fluid culture collected during surgery grew E. Coli and moderate streptococcus group C. No anaerobic growth. Abdominal film on hospital day 9 POD#7 showed improvement of small bowel obstruction after surgery with small bowel dilation. Patient had first bowel movement on POD8 and had NG tube removed and was kept NPO with daily progression of diet from NPO to clears to full to soft solids. On POD#11 Mr. Shirer was weened off of TPN, taken off of dilaudid and morphine PRN and transitioned to Percocet, zosyn was discontinued and transitioned to oral augmentin. He completed a full 14 day antibiotic course on day of discharge. Close PCP and surgery follow up were arranged.  2. Acute Kidney Injury  The patient presented with a Cr of 8.62, from his last known baseline of 1.2. Which was likely a combination of prerenal azotemia and lisinopril-induced ATN vs NSAID usage. Creatinine continued to improve through stay with IVF hydration. After surgery patient had a foley which was removed on hospital day 8 with no difficulty urinating. Cr continued down trending to 1.35 on day of discharge.  3. Elevated Anion Gap  The patient presented with an AG = 22, likely secondary to uremia due to AKI, but consideration also given to bowel necrosis, in the setting of SBO. Serial lactates were wnl. Resolved by hospital day 7 after continued IVF resuscitation and resolution of AKI.  4. Refeeding Mr. Grover  was placed on TPN due to prolonged NPO status on hospital day 6. TPN labs were obtained for mg, potassium, and phosphorus for three days with consistent normal values. IV multivitamin, thiamine, and folic acid were provided daily.   5. Acute on chronic anemia  Mr. Shaddix developed anemia on hospital day 6 likely 2/2 dilution along with acute blood loss anemia, to be expected s/p abdominal surgery, and also iatrogenic from serial blood draws. Patient has a history of macrocytic anemia of unknown origin that was worked up extensively at [facility] (records are in [place]). Nutritional markers were  normal and he even had a bone marrow biopsy that was unremarkable. No gross bleeding. NGT output was bilious. Smear 4/2 showed a target cells and slight polychromasia, normal Haptoglobin, elevated LDH, normal Iron, ferritin increased and Retic % normal. One unit of pRBCs was given on 4/2 for a hemoglobin of less than 6.8. Hemoglobin stabilized at ~8.0 prior to discharge. Please follow up outpatient CBC.  6. Hyperbilirubinemia with LFT abnormalities  The patient presented with tbili = 4.7 which was likely secondary to hypo-perfusion from hypovolemia, and less likely hemolysis based on non-characteristic labs and lack of prior known hepatic infections. CT did not show choleithiasis. Total bilirubin continued trending down during hospital course and AST and ALT began to rise on hospital day 7 which is most likely secondary to hypo-perfusion from hypovolemia. These plateaued and became down trending prior to discharge. Hepatitis panel was negative along with HIV non-reactive and RUQ U/S with no obvious obstruction.   7. Hyponatremia/Hypernatremia  Mr. Corvino presented with hyponatremia likely secondary to hypovolemia, in the setting of poor PO intake, vomiting. On hospital day three Mr. Walz sodium levels elevated to 150 and continued to rise in the subsequent days, most likely iatrogenic from aggressive  IVF resuscitation, NGT losses and losses from surgery. Converted to D5W with slow correction of hypernatremia to prevent central pontine myelinolysis. Central line placed for serial BMP draws. Sodium down trended to normal with these therapies.  8. Alcohol use  The patient reported drinking 2-3 beers/day. His last drink was approximately 1 week ago to admission and was considered outside window for withdrawal, but was put on CIWA protocol with thiamine and folate. This was discontinued after CIWAs consistently 0.   Discharge Vitals:   BP 133/78  Pulse 60  Temp(Src) 98.4 F (36.9 C) (Oral)  Resp 20  Ht 6' 2"  (1.88 m)  Wt 198 lb 8 oz (90.039 kg)  BMI 25.48 kg/m2  SpO2 100%  Discharge Labs:  Results for orders placed during the hospital encounter of 01/07/14 (from the past 24 hour(s))  CBC     Status: Abnormal   Collection Time    01/21/14  5:40 AM      Result Value Ref Range   WBC 5.0  4.0 - 10.5 K/uL   RBC 2.48 (*) 4.22 - 5.81 MIL/uL   Hemoglobin 8.1 (*) 13.0 - 17.0 g/dL   HCT 23.7 (*) 39.0 - 52.0 %   MCV 95.6  78.0 - 100.0 fL   MCH 32.7  26.0 - 34.0 pg   MCHC 34.2  30.0 - 36.0 g/dL   RDW 14.6  11.5 - 15.5 %   Platelets 511 (*) 150 - 400 K/uL  COMPREHENSIVE METABOLIC PANEL     Status: Abnormal   Collection Time    01/21/14  5:40 AM      Result Value Ref Range   Sodium 140  137 - 147 mEq/L   Potassium 4.3  3.7 - 5.3 mEq/L   Chloride 104  96 - 112 mEq/L   CO2 24  19 - 32 mEq/L   Glucose, Bld 84  70 - 99 mg/dL   BUN 23  6 - 23 mg/dL   Creatinine, Ser 1.35  0.50 - 1.35 mg/dL   Calcium 9.0  8.4 - 10.5 mg/dL   Total Protein 7.6  6.0 - 8.3 g/dL   Albumin 2.5 (*) 3.5 - 5.2 g/dL   AST 62 (*) 0 - 37 U/L   ALT 123 (*) 0 - 53 U/L  Alkaline Phosphatase 102  39 - 117 U/L   Total Bilirubin 1.1  0.3 - 1.2 mg/dL   GFR calc non Af Amer 55 (*) >90 mL/min   GFR calc Af Amer 64 (*) >90 mL/min    Signed: Lesly Dukes, MD 01/21/2014, 12:58 PM   Time Spent on Discharge: 45  minutes  Services Needed at time of discharge: Y = Yes, Blank = No  PT:  No PT follow up;Supervision - Intermittent   OT:  No OT follow up;Supervision - Intermittent (needs to follow up for outpt therapy s/p RTC repair)   RN:    Equipment:  Rolling walker with 5" wheels   Other:

## 2014-01-21 LAB — COMPREHENSIVE METABOLIC PANEL
ALBUMIN: 2.5 g/dL — AB (ref 3.5–5.2)
ALT: 123 U/L — AB (ref 0–53)
AST: 62 U/L — AB (ref 0–37)
Alkaline Phosphatase: 102 U/L (ref 39–117)
BUN: 23 mg/dL (ref 6–23)
CO2: 24 mEq/L (ref 19–32)
Calcium: 9 mg/dL (ref 8.4–10.5)
Chloride: 104 mEq/L (ref 96–112)
Creatinine, Ser: 1.35 mg/dL (ref 0.50–1.35)
GFR calc Af Amer: 64 mL/min — ABNORMAL LOW (ref >90)
GFR calc non Af Amer: 55 mL/min — ABNORMAL LOW (ref >90)
Glucose, Bld: 84 mg/dL (ref 70–99)
POTASSIUM: 4.3 meq/L (ref 3.7–5.3)
SODIUM: 140 meq/L (ref 137–147)
Total Bilirubin: 1.1 mg/dL (ref 0.3–1.2)
Total Protein: 7.6 g/dL (ref 6.0–8.3)

## 2014-01-21 LAB — CBC
HCT: 23.7 % — ABNORMAL LOW (ref 39.0–52.0)
Hemoglobin: 8.1 g/dL — ABNORMAL LOW (ref 13.0–17.0)
MCH: 32.7 pg (ref 26.0–34.0)
MCHC: 34.2 g/dL (ref 30.0–36.0)
MCV: 95.6 fL (ref 78.0–100.0)
Platelets: 511 10*3/uL — ABNORMAL HIGH (ref 150–400)
RBC: 2.48 MIL/uL — ABNORMAL LOW (ref 4.22–5.81)
RDW: 14.6 % (ref 11.5–15.5)
WBC: 5 10*3/uL (ref 4.0–10.5)

## 2014-01-21 MED ORDER — OXYCODONE-ACETAMINOPHEN 5-325 MG PO TABS: 1.0000 | ORAL_TABLET | Freq: Four times a day (QID) | ORAL | Status: DC | PRN

## 2014-01-21 MED ORDER — ONDANSETRON HCL 4 MG PO TABS: 4.0000 mg | ORAL_TABLET | Freq: Three times a day (TID) | ORAL | Status: DC | PRN

## 2014-01-21 NOTE — Progress Notes (Signed)
Discharge instructions and prescription/work note given to pt and explained to pt.  Pt. Verbalized understanding of all orders/instructions.  IV removed by IV nurse.  Pt. In stable condition for discharge home.  Awaiting ride from family member. \Lonnie Henry

## 2014-01-21 NOTE — Progress Notes (Signed)
Patient ID: Lonnie Henry, male   DOB: [birth year], 63 y.o.   MRN: [id number] 12 Days Post-Op  Subjective: Pt feels well.  Tolerating solid diet.  Pain well controlled  Objective: Vital signs in last 24 hours: Temp:  [98.3 F (36.8 C)-98.4 F (36.9 C)] 98.4 F (36.9 C) (04/08 0634) Pulse Rate:  [60-70] 60 (04/08 0634) Resp:  [16-20] 20 (04/08 0634) BP: (117-133)/(75-79) 133/78 mmHg (04/08 0634) SpO2:  [99 %-100 %] 100 % (04/08 0634) Weight:  [198 lb 8 oz (90.039 kg)] 198 lb 8 oz (90.039 kg) (04/08 0634) Last BM Date: 01/20/14  Intake/Output from previous day: 04/07 0701 - 04/08 0700 In: -  Out: 1380 [Urine:1375; Drains:5] Intake/Output this shift:    PE: Abd: soft, minimally tender, +BS, ND, incision with staples out and steri-strips in place.  Healing well.  JP with serous output.  Lab Results:   Recent Labs  01/20/14 0510 01/21/14 0540  WBC 6.6 5.0  HGB 7.5* 8.1*  HCT 21.9* 23.7*  PLT 548* 511*   BMET  Recent Labs  01/20/14 0510 01/21/14 0540  NA 140 140  K 4.0 4.3  CL 103 104  CO2 23 24  GLUCOSE 127* 84  BUN 27* 23  CREATININE 1.49* 1.35  CALCIUM 8.9 9.0   PT/INR No results found for this basename: LABPROT, INR,  in the last 72 hours CMP     Component Value Date/Time   NA 140 01/21/2014 0540   K 4.3 01/21/2014 0540   CL 104 01/21/2014 0540   CO2 24 01/21/2014 0540   GLUCOSE 84 01/21/2014 0540   BUN 23 01/21/2014 0540   CREATININE 1.35 01/21/2014 0540   CALCIUM 9.0 01/21/2014 0540   PROT 7.6 01/21/2014 0540   ALBUMIN 2.5* 01/21/2014 0540   AST 62* 01/21/2014 0540   ALT 123* 01/21/2014 0540   ALKPHOS 102 01/21/2014 0540   BILITOT 1.1 01/21/2014 0540   GFRNONAA 55* 01/21/2014 0540   GFRAA 64* 01/21/2014 0540   Lipase     Component Value Date/Time   LIPASE 24 01/06/2014 2345       Studies/Results: No results found.  Anti-infectives: Anti-infectives   Start     Dose/Rate Route Frequency Ordered Stop   01/20/14 1000  amoxicillin-clavulanate (AUGMENTIN) 875-125 MG per  tablet 1 tablet     1 tablet Oral Every 12 hours 01/20/14 0810 01/22/14 0959   01/09/14 1315  piperacillin-tazobactam (ZOSYN) IVPB 3.375 g     3.375 g 100 mL/hr over 30 Minutes Intravenous STAT 01/09/14 1300 01/09/14 1733   01/08/14 2300  piperacillin-tazobactam (ZOSYN) IVPB 3.375 g  Status:  Discontinued     3.375 g 12.5 mL/hr over 240 Minutes Intravenous Every 8 hours 01/08/14 1447 01/20/14 0810   01/08/14 1500  piperacillin-tazobactam (ZOSYN) IVPB 3.375 g     3.375 g 100 mL/hr over 30 Minutes Intravenous  Once 01/08/14 1447 01/08/14 1540       Assessment/Plan  1. POD 12, s/p ex lap for SBO with appendectomy and drainage of abscess  2. PCM/TNA, resolved  3. Post op ileus, resolved  Plan: 1. Will dc JP drain.  2. Ok for DC home today from our standpoint 3. Dc abx therapy today.  Today is a total of 14 days. 4. Follow up with Lonnie Henry in 3 weeks   LOS: 14 days    Lonnie Henry 01/21/2014, 7:55 AM Pager: [phone]

## 2014-01-21 NOTE — Progress Notes (Signed)
Occupational Therapy Treatment Patient Details Name: Kathaleen MaserCarl Chestang MRN: [id number] DOB: 08/13/1951 Today's Date: 01/21/2014    History of present illness Adm 3/25 and underwent Exploratory laparotomy, LOA, appendectomy and drainage of abscess(01/09/14)     OT comments  Pt making good progress with functional goals, Nurse entered room at end of session and stated that pt can d/c home today  Follow Up Recommendations  No OT follow up;Supervision - Intermittent    Equipment Recommendations  None recommended by OT    Recommendations for Other Services      Precautions / Restrictions Precautions Precautions: None Restrictions Weight Bearing Restrictions: No       Mobility Bed Mobility Overal bed mobility: Modified Independent                Transfers Overall transfer level: Modified independent Equipment used: Rolling walker (2 wheeled) Transfers: Sit to/from Stand Sit to Stand: Modified independent (Device/Increase time)         General transfer comment: Cues for upright posture    Balance Overall balance assessment: No apparent balance deficits (not formally assessed)                                 ADL       Grooming: Wash/dry hands;Wash/dry face;Standing;Supervision/safety       Lower Body Bathing: Animatorupervison/ safety;Set up;Sit to/from stand;Sitting/lateral leans;Min guard       Lower Body Dressing: Supervision/safety;Sitting/lateral leans;Set up;Min guard;Sit to/from stand   Toilet Transfer: Modified Journalist, newspaperndependent;RW;Regular Toilet           Functional mobility during ADLs: Modified independent General ADL Comments: Pt able to stand at closet and drawers to retrieve ADL/toiletry items                                      Cognition   Behavior During Therapy: [facility]FL for tasks assessed/performed Overall Cognitive Status: Within Functional Limits for tasks assessed                                                 General Comments  Pt pleasant, cooperative and motivated    Pertinent Vitals/ Pain       2/10 abdominal discomfit reported, VSS                                            Prior Functioning/Environment  independent           Frequency Min 2X/week     Progress Toward Goals  OT Goals(current goals can now be found in the care plan section)  Progress towards OT goals: Progressing toward goals     Plan Discharge plan remains appropriate                     End of Session Equipment Utilized During Treatment: Rolling walker   Activity Tolerance Patient tolerated treatment well   Patient Left with call bell/phone within reach;in chair;with nursing/sitter in room             Time: 1105-1136 OT Time Calculation (min): 31 min  Charges: OT General  Charges $OT Visit: 1 Procedure OT Treatments $Self Care/Home Management : 8-22 mins $Therapeutic Activity: 8-22 mins  Lafe Garin 01/21/2014, 1:06 PM

## 2014-01-21 NOTE — Progress Notes (Signed)
Subjective: Patient seen and examined at the bedside this morning. He feels well. Pain is well controlled. He required a little bit of pain medicine when they were removing his staples yesterday. He is ready to go home.  I spoke with surgery and they agree with discharge. JP drain and central line still need to be removed. They would like him to wait another 1-2 weeks before returning to work. Regular diet OK. He is done with his antibiotics.  Objective: Vital signs in last 24 hours: Filed Vitals:   01/20/14 0612 01/20/14 1532 01/20/14 2246 01/21/14 0634  BP: 143/88 131/79 117/75 133/78  Pulse: 65 70 66 60  Temp: 98.6 F (37 C) 98.3 F (36.8 C) 98.4 F (36.9 C) 98.4 F (36.9 C)  TempSrc: Oral Oral Oral Oral  Resp: 16 16 20 20   Height:      Weight: 199 lb 9.6 oz (90.538 kg)   198 lb 8 oz (90.039 kg)  SpO2: 100% 99% 100% 100%   Weight change: -1 lb 1.6 oz (-0.499 kg)  Intake/Output Summary (Last 24 hours) at 01/21/14 0651 Last data filed at 01/21/14 8119  Gross per 24 hour  Intake      0 ml  Output   1380 ml  Net  -1380 ml   Physical Exam Vitals reviewed. General: Sitting up in a chair, NAD, central line in place HEENT: PERRL, EOMI, no scleral icterus.   Cardiac: RRR Pulm: Respirations equal Abd: Soft, non-distended, mild TTP in RLQ, incision c/d/i, staples removed, Drain with scant serous drainage. Ext: Moving all 4 extremities, no appreciable edema Neuro: Alert and oriented X3, CN 2-12 grossly intact, non-focal  Lab Results: Basic Metabolic Panel:  Recent Labs Lab 01/17/14 0545 01/18/14 0515 01/19/14 0740 01/20/14 0510 01/21/14 0540  NA 147 146 143 140 140  K 4.5 4.0 4.1 4.0 4.3  CL 108 107 104 103 104  CO2 25 24 24 23 24   GLUCOSE 129* 144* 141* 127* 84  BUN 38* 34* 31* 27* 23  CREATININE 1.59* 1.71* 1.60* 1.49* 1.35  CALCIUM 9.2 9.5 9.1 8.9 9.0  MG 1.9 1.9 1.7  --   --   PHOS 3.3  --  3.3  --   --    Liver Function Tests:  Recent Labs Lab  01/20/14 0510 01/21/14 0540  AST 61* 62*  ALT 124* 123*  ALKPHOS 98 102  BILITOT 1.1 1.1  PROT 7.3 7.6  ALBUMIN 2.3* 2.5*   CBC:  Recent Labs Lab 01/19/14 0740 01/20/14 0510 01/21/14 0540  WBC 9.0 6.6 5.0  NEUTROABS 7.2  --   --   HGB 8.1* 7.5* 8.1*  HCT 23.9* 21.9* 23.7*  MCV 96.0 95.2 95.6  PLT 535* 548* 511*   Cardiac Enzymes:  Recent Labs Lab 01/15/14 0310 01/16/14 0335  CKTOTAL 1046* 679*   CBG:  Recent Labs Lab 01/17/14 0547 01/17/14 0610 01/17/14 1144 01/17/14 1738 01/17/14 2341 01/18/14 0517  GLUCAP 112* 123* 122* 114* 140* 133*    Micro Results: No results found for this or any previous visit (from the past 240 hour(s)). Studies/Results: No results found. Medications: I have reviewed the patient's current medications. Scheduled Meds: . amoxicillin-clavulanate  1 tablet Oral Q12H  . antiseptic oral rinse  15 mL Mouth Rinse q12n4p  . chlorhexidine  15 mL Mouth Rinse BID  . heparin  5,000 Units Subcutaneous 3 times per day   Continuous Infusions:   PRN Meds:.ondansetron, oxyCODONE-acetaminophen, sodium chloride  Assessment/Plan: The patient is  a 63 yo man, presenting with abdominal pain, nausea, and vomiting, found to have a small bowel obstruction and subsequently ruptured appendicitis and is s/p ex-lap.  #Acute small bowel obstruction and ruptured appendicitis with intraabdominal abscess: POD#12 s/p ex lap and lysis of adhesions 2/2 ruptured appendicitis and SBO. Peritoneal fluid culture collected in the OR grew rare E. Coli, moderate streptococcus group C. Anaerobic cultures are no growth. Surgical pathology of the appendix shows acute supperative appendicitis. Patient now with resolution of his post-op ileus. NG tube removed 4/4, advancing diet, TPN was weaned yesterday. - Appreciate surgery recs  - Regular diet - Discontinue JP drain - Pain control with Percocet 1-2 tabs q3h prn, he only required 3 tabs yesterday and overnight - Zofran  q6hr prn nausea - Today is his last day of antibiotic treatment with Augmentin (antibiotic day 14/14) - Incentive spirometry  - Mobilize OOB, PT eval and treat, OT eval and treat - Medically stable for discharge today  #Acute on chronic anemia: Stable. Likely dilutional plus acute blood loss anemia s/p abdominal surgery. Also iatrogenic from serial blood draws.  Patient has a history of macrocytic anemia of unknown origin that was worked up extensively at [facility] (records are in [place]). Nutritional markers were normal and he even had a bone marrow biopsy that was unremarkable. No gross bleeding. NGT output is bilious. Smear 4/2 showed a target cells and slight polychromasia. Haptoglobin NOT low. LDH 324 (elevated). Iron panel below. Iron normal, ferritin increased (acute phase reactant). Retic % normal. - Continue to monitor - Transfusion threshold Hgb <7 - Outpatient CBC  Recent Labs Lab 01/17/14 0545 01/18/14 0515 01/19/14 0740 01/20/14 0510 01/21/14 0540  HGB 7.5* 8.3* 8.1* 7.5* 8.1*    Iron/TIBC/Ferritin    Component Value Date/Time   IRON 66 01/15/2014 1130   TIBC 211* 01/15/2014 1130   FERRITIN 937* 01/15/2014 1130    #Acute kidney injury: Presented with a Cr of 8.62, from his last known baseline of 1.2. This likely represented a combination of prerenal azotemia and ATN. Cr downtrended with IVF resuscitation, stabilized at ~3.5 after his surgery, and continues trending down again since POD#5. Suspect resolving pre-renal azotemia from insensible losses during surgery/NPO +/- ATN. CK elevated at 2764 on 3/31 (it was 265 on admission), CK trended and rhabdomyolysis resolving. Foley was discontinued, and he is urinating normally. - Holding nephrotoxic meds  - Repeat BMP as outpatient  Recent Labs Lab 01/17/14 0545 01/18/14 0515 01/19/14 0740 01/20/14 0510 01/21/14 0540  CREATININE 1.59* 1.71* 1.60* 1.49* 1.35    #Transaminitis and resolving hyperbilirubinemia:  Total bilirubin peaked at 7.3 on 3/26. Mostly direct bilirubinemia. LFTs were only mildly elevated at that time. Bili has trended down, but then AST/ALT rose and plateaued; thought to be 2/2 hypoperfusion from hypovolemia. His ALT increased to 184 on 4/4, possibly 2/2 TPN. It is now down trending.  Hepatitis panel negative. HIV NR. RUQ [place] with no obvious obstruction. - Liver function panel as outpatient  Hepatic Function Panel     Component Value Date/Time   PROT 7.6 01/21/2014 0540   ALBUMIN 2.5* 01/21/2014 0540   AST 62* 01/21/2014 0540   ALT 123* 01/21/2014 0540   ALKPHOS 102 01/21/2014 0540   BILITOT 1.1 01/21/2014 0540   BILIDIR 0.5* 01/20/2014 0510   IBILI 0.6 01/20/2014 0510     #Hypernatremia/hyperchloremia, resolved: Likely iatrogenic from aggressive IVF resuscitation combined with NGT losses and insensible losses from surgery. Steady trend down to normal after aggressive  D5W infusion, now tolerating a diet.  #Refeeding: Dietitian and pharmacy were consulted for TPN given prolonged n.p.o. status. He has an IJ central line in correct position per PCCM. TPN was weaned yesterday. - Appreciate nutrition and pharmacy recs - Discontinue central line  #Elevated anion gap: Resolved. Likely secondary to uremia vs. starvation ketoacidosis. Serial lactates have been normal.   #Alcohol use: The patient was drinking 1-3 craft beers/day x 5 days per week. His last drink was on St. Patrick's Day (March 17). Withdrawal unlikely at this point. CIWA d/c'd. - Will prescribe oral multivitamin, thiamine, folic acid at discharge  #DVT PPx: Heparin subq, SCDs   Dispo: Disposition is deferred at this time, awaiting improvement of current medical problems.  Anticipated discharge in approximately 2-3 day(s).    The patient does have a current PCP (Dionne Volanda Napoleon, MD) and does not need an [facility] hospital follow-up appointment after discharge.  The patient does not have transportation limitations that hinder  transportation to clinic appointments.  .Services Needed at time of discharge: Y = Yes, Blank = No PT: No PT follow up;Supervision - Intermittent  OT: No OT follow up;Supervision - Intermittent (needs to follow up for outpt therapy s/p RTC repair)   RN:   Equipment: Rolling walker with 5" wheels   Other:      LOS: 14 days   Lesly Dukes, MD 01/21/2014, 6:51 AM   Lesly Dukes, MD  [email or web address] Pager # [phone] Office # [phone]

## 2014-01-21 NOTE — Progress Notes (Signed)
JP drain removed.  Pt. Tolerated well. Will continue to monitor. Vanice Sarahaylor L Thompson

## 2014-01-21 NOTE — Discharge Instructions (Signed)
- Please follow up with your primary care physician and surgeon next Friday on 4/17.  - If you develop pain, fever, nausea, vomiting please call your doctor or return to the ED. - I have provided a work note for the next 2 weeks. - Please eat a regular diet. - We are sending you home with the last dose of your antibiotic to take tonight.  [facility]      [facility], [place] [phone]  OPEN ABDOMINAL SURGERY: POST OP INSTRUCTIONS  Always review your discharge instruction sheet given to you by the facility where your surgery was performed.  IF YOU HAVE DISABILITY OR FAMILY LEAVE FORMS, YOU MUST BRING THEM TO THE OFFICE FOR PROCESSING.  PLEASE DO NOT GIVE THEM TO YOUR DOCTOR.  1. A prescription for pain medication may be given to you upon discharge.  Take your pain medication as prescribed, if needed.  If narcotic pain medicine is not needed, then you may take acetaminophen (Tylenol) or ibuprofen (Advil) as needed. 2. Take your usually prescribed medications unless otherwise directed. 3. If you need a refill on your pain medication, please contact your pharmacy. They will contact our office to request authorization.  Prescriptions will not be filled after 5pm or on week-ends. 4. You should follow a light diet the first few days after arrival home, such as soup and crackers, pudding, etc.unless your doctor has advised otherwise. A high-fiber, low fat diet can be resumed as tolerated.   Be sure to include lots of fluids daily. Most patients will experience some swelling and bruising on the chest and neck area.  Ice packs will help.  Swelling and bruising can take several days to resolve 5. Most patients will experience some swelling and bruising in the area of the incision. Ice pack will help. Swelling and bruising can take several days to resolve..  6. It is common to experience some constipation if taking pain medication after surgery.  Increasing fluid intake and taking a stool softener will  usually help or prevent this problem from occurring.  A mild laxative (Milk of Magnesia or Miralax) should be taken according to package directions if there are no bowel movements after 48 hours. 7.  You may have steri-strips (small skin tapes) in place directly over the incision.  These strips should be left on the skin for 7-10 days.  If your surgeon used skin glue on the incision, you may shower in 24 hours.  The glue will flake off over the next 2-3 weeks.  Any sutures or staples will be removed at the office during your follow-up visit. You may find that a light gauze bandage over your incision may keep your staples from being rubbed or pulled. You may shower and replace the bandage daily. 8. ACTIVITIES:  You may resume regular (light) daily activities beginning the next day--such as daily self-care, walking, climbing stairs--gradually increasing activities as tolerated.  You may have sexual intercourse when it is comfortable.  Refrain from any heavy lifting or straining until approved by your doctor. a. You may drive when you no longer are taking prescription pain medication, you can comfortably wear a seatbelt, and you can safely maneuver your car and apply brakes b. Return to Work: ___________________________________ 9. You should see your doctor in the office for a follow-up appointment approximately two weeks after your surgery.  Make sure that you call for this appointment within a day or two after you arrive home to insure a convenient appointment time. OTHER INSTRUCTIONS:  _____________________________________________________________ _____________________________________________________________  WHEN TO CALL YOUR DOCTOR: 1. Fever over 101.0 2. Inability to urinate 3. Nausea and/or vomiting 4. Extreme swelling or bruising 5. Continued bleeding from incision. 6. Increased pain, redness, or drainage from the incision. 7. Difficulty swallowing or breathing 8. Muscle cramping or  spasms. 9. Numbness or tingling in hands or feet or around lips.  The clinic staff is available to answer your questions during regular business hours.  Please dont hesitate to call and ask to speak to one of the nurses if you have concerns.  For further questions, please visit [email or web address]

## 2014-01-30 ENCOUNTER — Ambulatory Visit (INDEPENDENT_AMBULATORY_CARE_PROVIDER_SITE_OTHER): Payer: BC Managed Care – PPO | Admitting: General Surgery

## 2014-01-30 ENCOUNTER — Encounter (INDEPENDENT_AMBULATORY_CARE_PROVIDER_SITE_OTHER): Payer: Self-pay | Admitting: General Surgery

## 2014-01-30 VITALS — BP 116/80 | HR 80 | Temp 97.8°F | Resp 14 | Ht 70.0 in | Wt 199.6 lb

## 2014-01-30 DIAGNOSIS — K352 Acute appendicitis with generalized peritonitis, without abscess: Secondary | ICD-10-CM

## 2014-01-30 DIAGNOSIS — K35209 Acute appendicitis with generalized peritonitis, without abscess, unspecified as to perforation: Secondary | ICD-10-CM

## 2014-01-30 DIAGNOSIS — K56609 Unspecified intestinal obstruction, unspecified as to partial versus complete obstruction: Secondary | ICD-10-CM

## 2014-01-30 DIAGNOSIS — K3532 Acute appendicitis with perforation and localized peritonitis, without abscess: Secondary | ICD-10-CM

## 2014-01-30 NOTE — Progress Notes (Signed)
Subjective:     Patient ID: Lonnie Henry, male   DOB: 1951/06/07, 63 y.o.   MRN: [id number]  HPI Patient is status post laparotomy for bowel obstruction caused by perforated appendicitis. He is doing well. He is eating and moving his bowels without difficulty.  Review of Systems     Objective:   Physical Exam Abdomen soft nontender. Incision is well-healed without signs of infection.    Assessment:     Doing well S/P exploratory laparotomy for bowel obstruction secondary to perforated appendicitis    Plan:     No heavy lifting until the second week of June. He may try running at the end of this month if he tolerates it well. Return as needed.

## 2014-01-30 NOTE — Patient Instructions (Signed)
You may try to resume running at the end of the month. Hold off if it causes you abdominal pain. You may begin weight training the second week in June.

## 2014-06-04 IMAGING — CT CT ABD-PELV W/O CM
2 of 4 series · 17 of 46 positions shown, 19 images · non-contrast
Comparison: No priors.

CLINICAL DATA: Abdominal tenderness and guarding.

EXAM:
CT ABDOMEN AND PELVIS WITHOUT CONTRAST
TECHNIQUE: Multidetector CT imaging of the abdomen and pelvis was performed
following the standard protocol without intravenous contrast.

[Series 2: routine · axial · 0.79mm/px · z∈[-565,-120]mm · 14 of 99 slices shown, 16 images]
[im 5/99  soft-tissue]
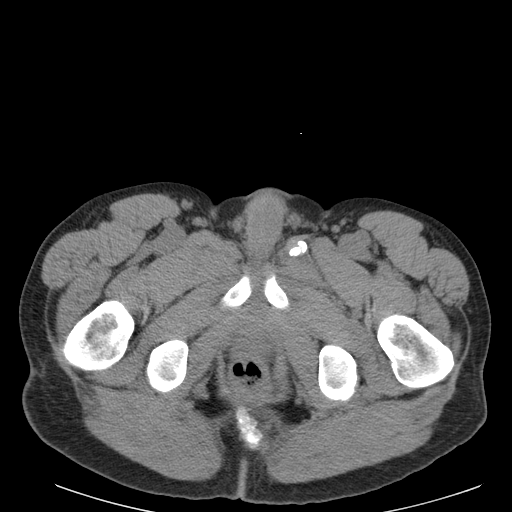
[im 5/99  bone]
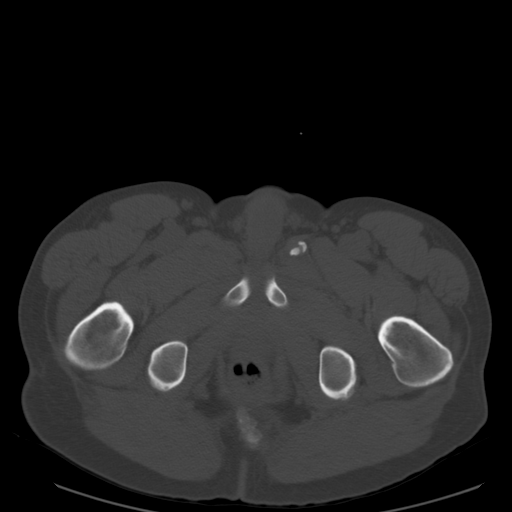
[im 13/99  soft-tissue]
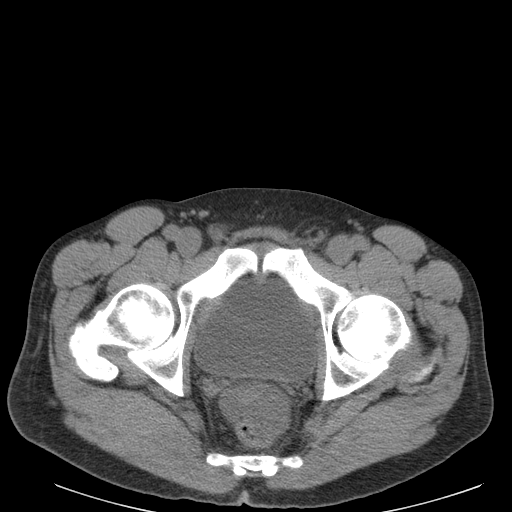
[im 18/99  soft-tissue]
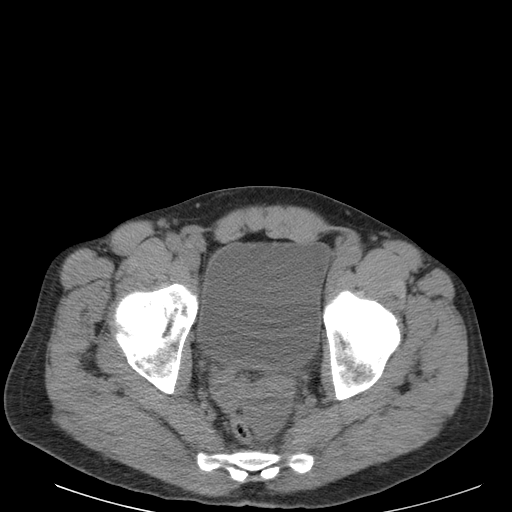
[im 26/99  soft-tissue]
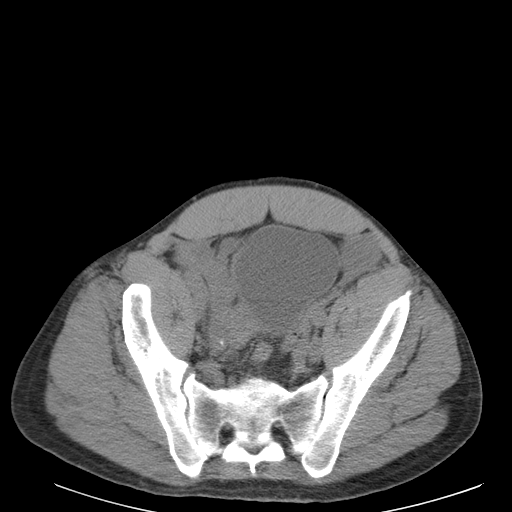
[im 35/99  soft-tissue]
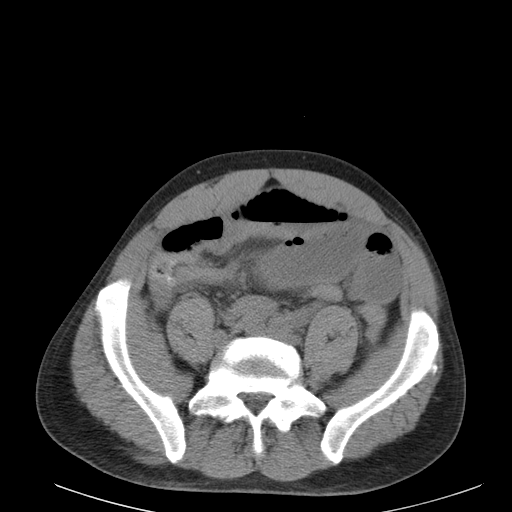
[im 39/99  soft-tissue]
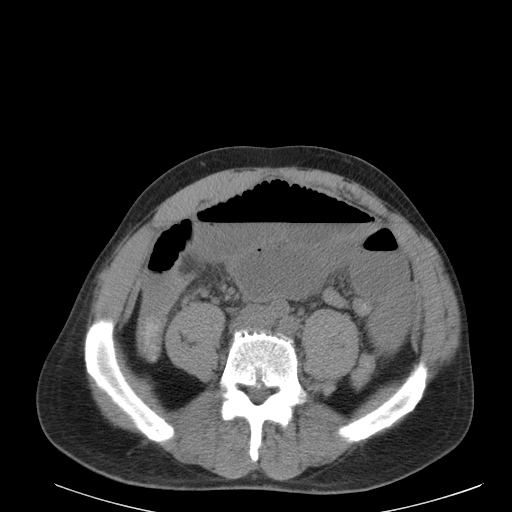
[im 47/99  soft-tissue]
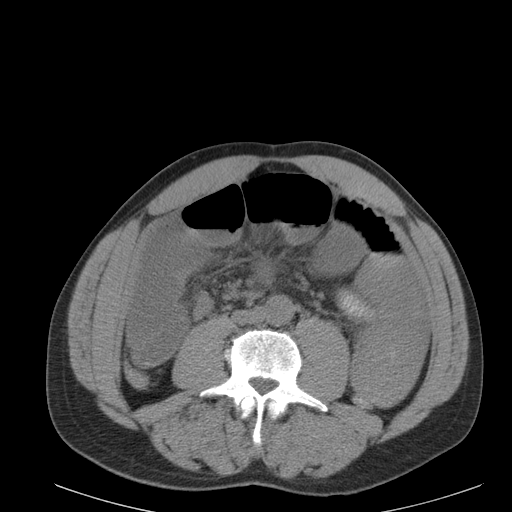
[im 52/99  soft-tissue]
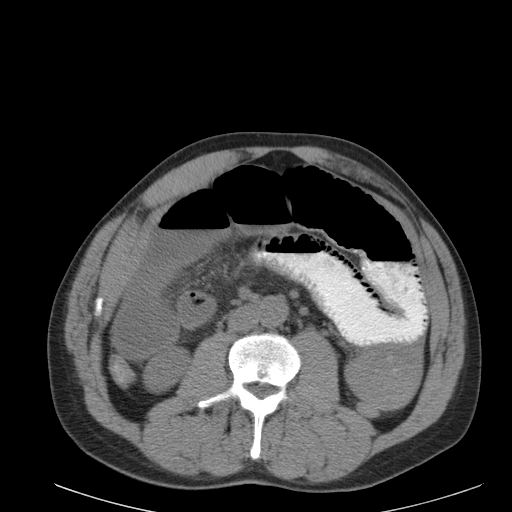
[im 60/99  soft-tissue]
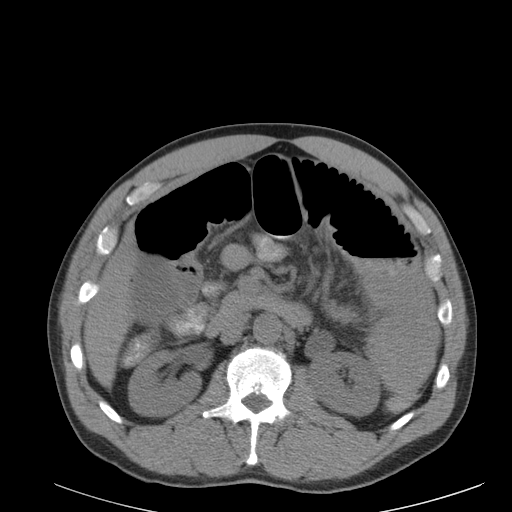
[im 60/99  bone]
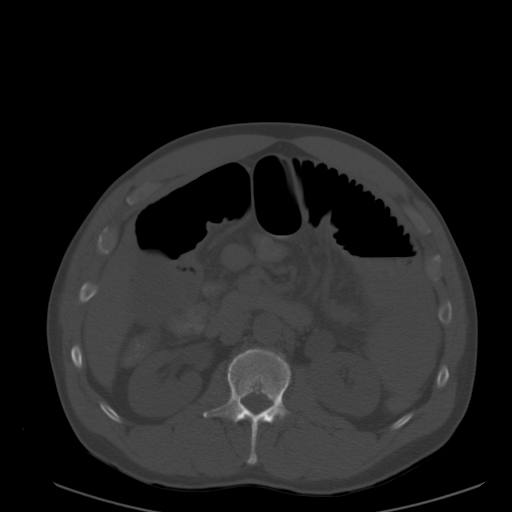
[im 64/99  soft-tissue]
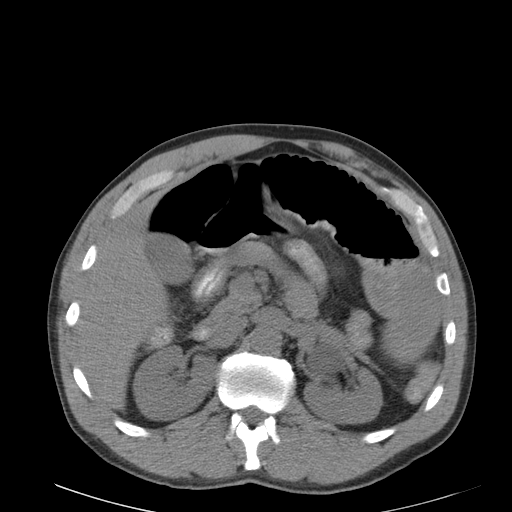
[im 73/99  soft-tissue]
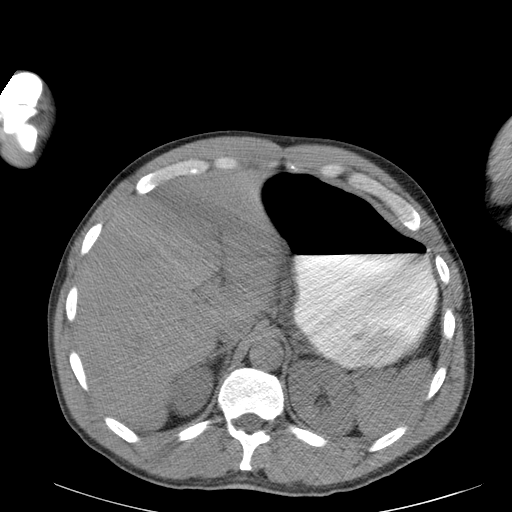
[im 81/99  soft-tissue]
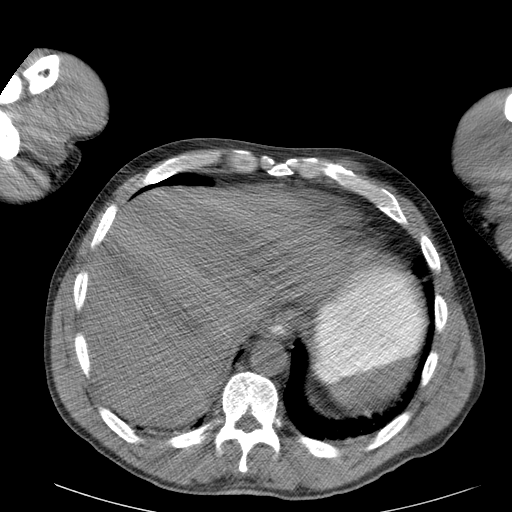
[im 86/99  soft-tissue]
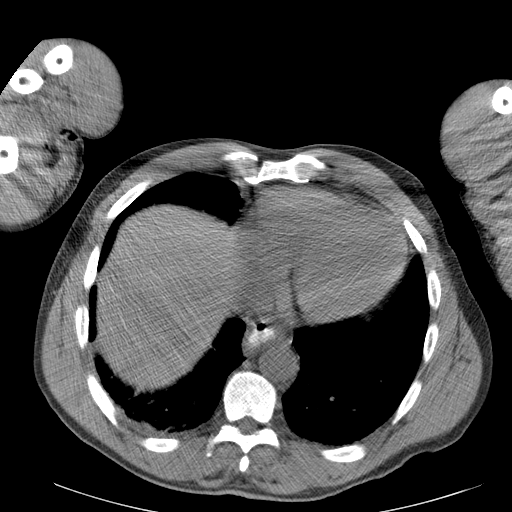
[im 94/99  soft-tissue]
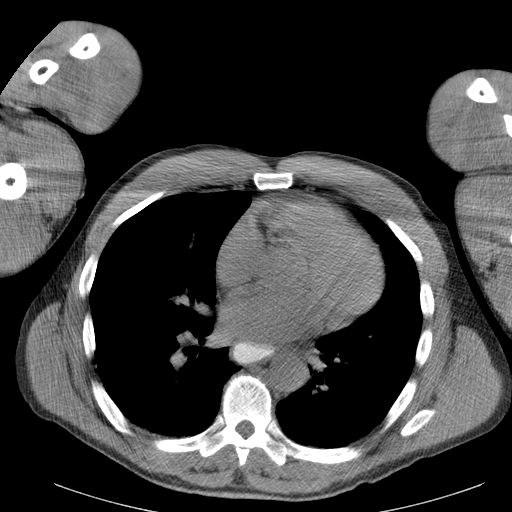

[coronal · coronal · 0.95mm/px · 3 of 114 slices shown]
[im 38/114  soft-tissue]
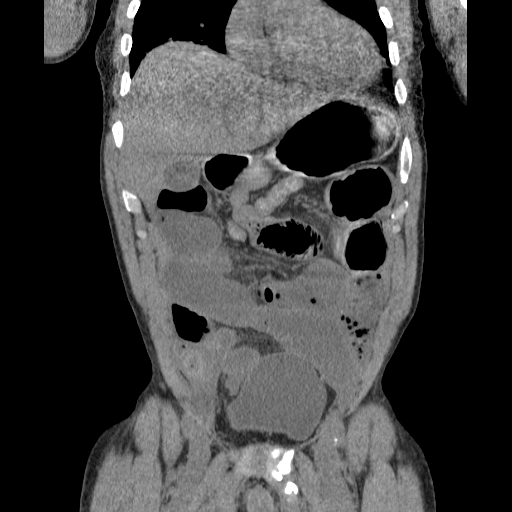
[im 51/114  soft-tissue]
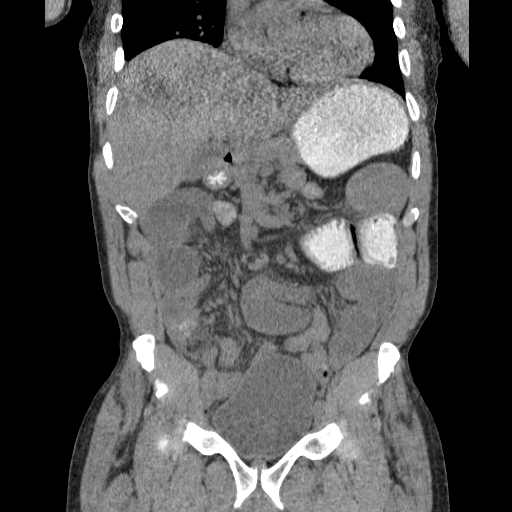
[im 63/114  soft-tissue]
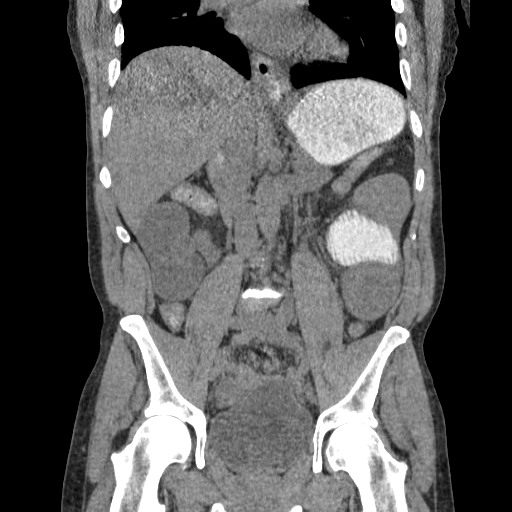

[17 of 46 positions shown; findings below may reference images not displayed]

FINDINGS: Lung Bases: Dependent subsegmental atelectasis in the lower lobes of
the lungs bilaterally (right greater than left). Fluid filled
patulous esophagus.

Abdomen/Pelvis: The unenhanced appearance of the liver, gallbladder,
pancreas, spleen, bilateral adrenal glands and the right kidney is
unremarkable. Probable extra renal pelvis in the left kidney. Left
kidney is otherwise unremarkable in appearance.

Profound dilatation of the small bowel with multiple air-fluid
levels. Small bowel loops measure up to 6.7 cm in diameter. Stomach
also appears distended. Distal small bowel appears decompressed, as
does the colon. An exact transition point is not confidently
identified on today's non contrast CT examination. Trace volume of
ascites. No pneumoperitoneum. No definite lymphadenopathy identified
within the abdomen or pelvis on today's non contrast CT examination.

Musculoskeletal: There are no aggressive appearing lytic or blastic
lesions noted in the visualized portions of the skeleton.
IMPRESSION: 1. Small bowel obstruction. An exact transition point is not
confidently identified on today's non contrast CT examination.
2. Trace volume of ascites.
3. No pneumoperitoneum.

## 2014-06-05 IMAGING — US US ABDOMEN LIMITED
1 series · 14 of 25 positions shown · non-contrast
Comparison: CT ABD/PELV WO CM dated 01/07/2014

CLINICAL DATA: Abdominal pain, suspect acute cholecystitis

EXAM:
US ABDOMEN LIMITED - RIGHT UPPER QUADRANT

[Series 1: us abdomen limited · 0.27mm/px · 14 of 36 slices shown]
[im 1/36]
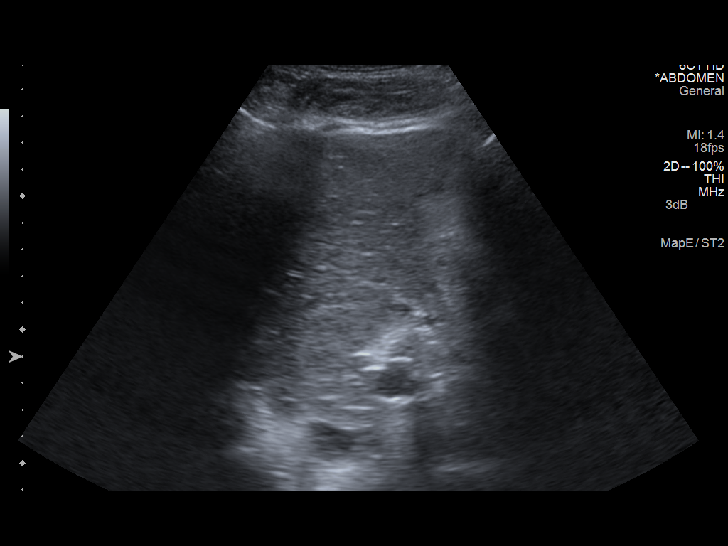
[im 3/36]
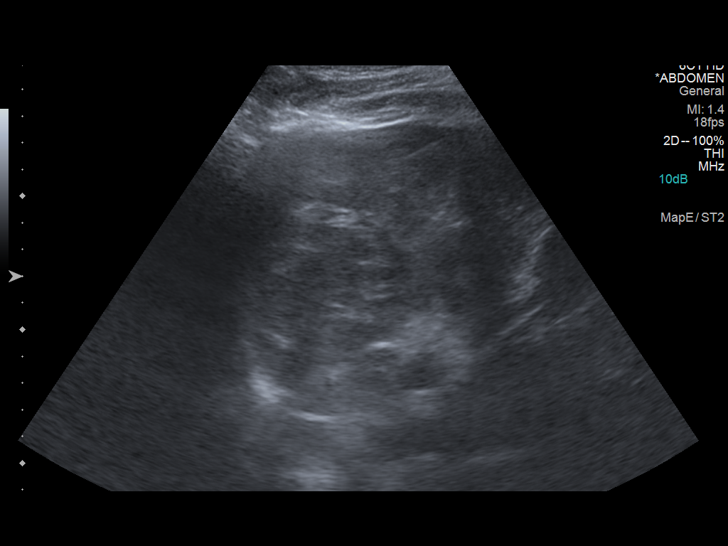
[im 6/36]
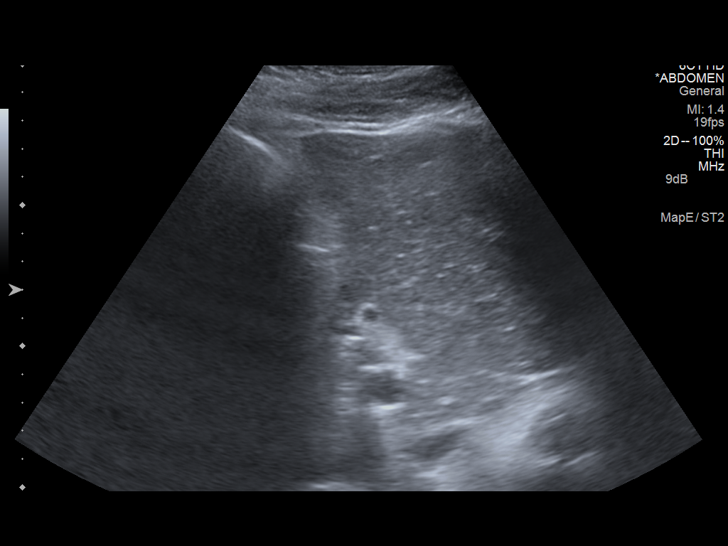
[im 9/36]
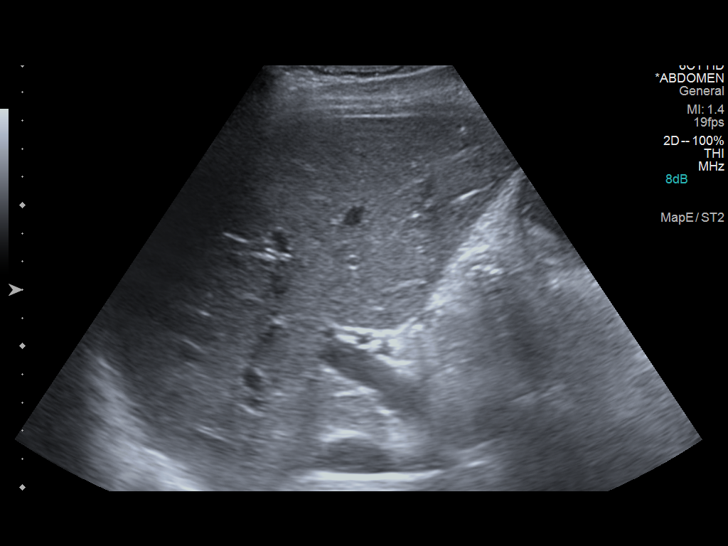
[im 12/36]
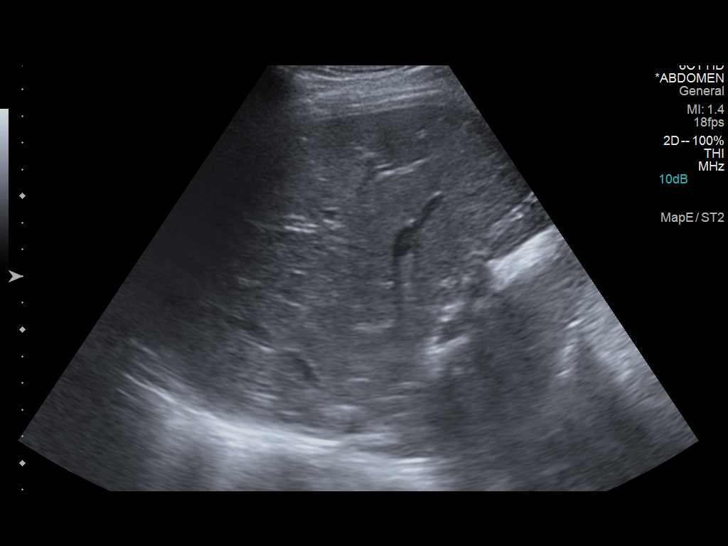
[im 14/36]
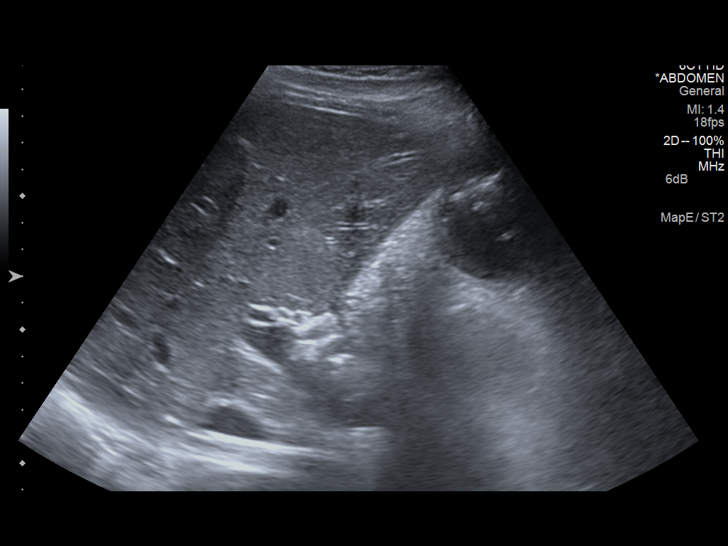
[im 17/36]
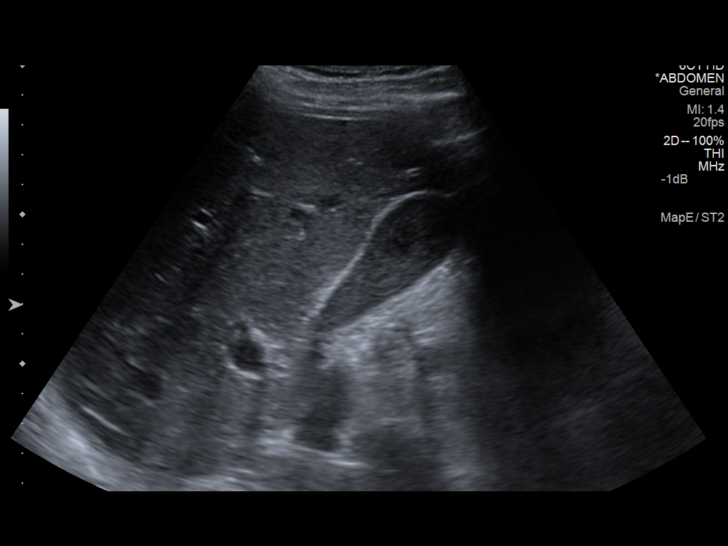
[im 19/36]
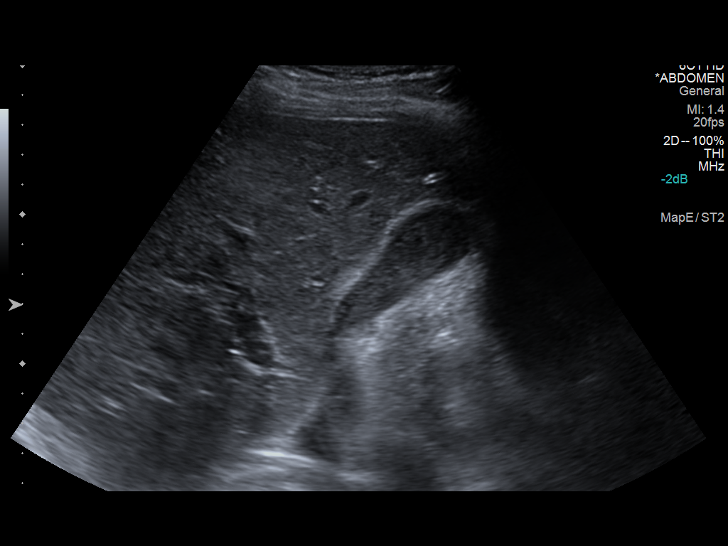
[im 22/36]
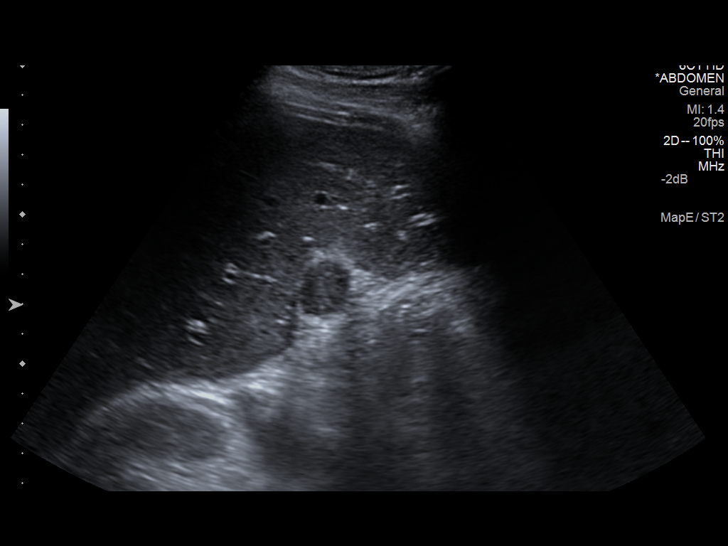
[im 24/36]
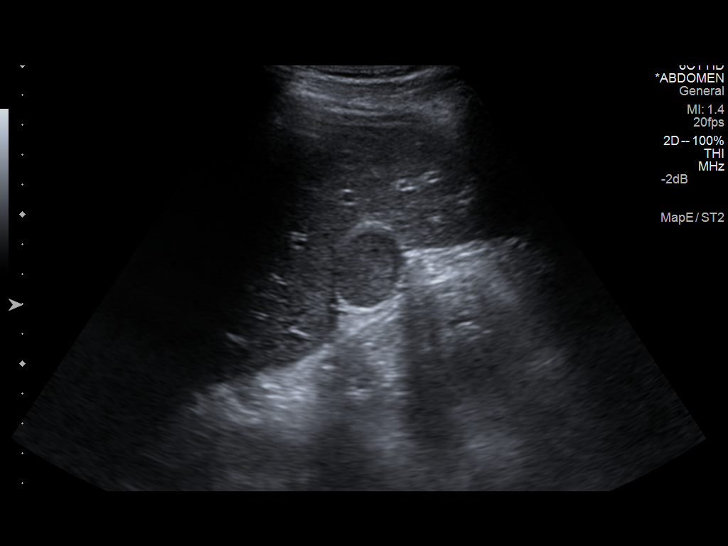
[im 27/36]
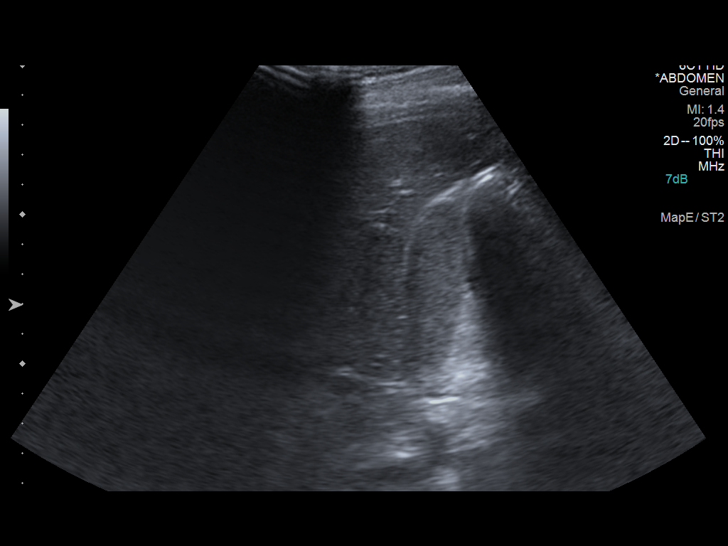
[im 30/36]
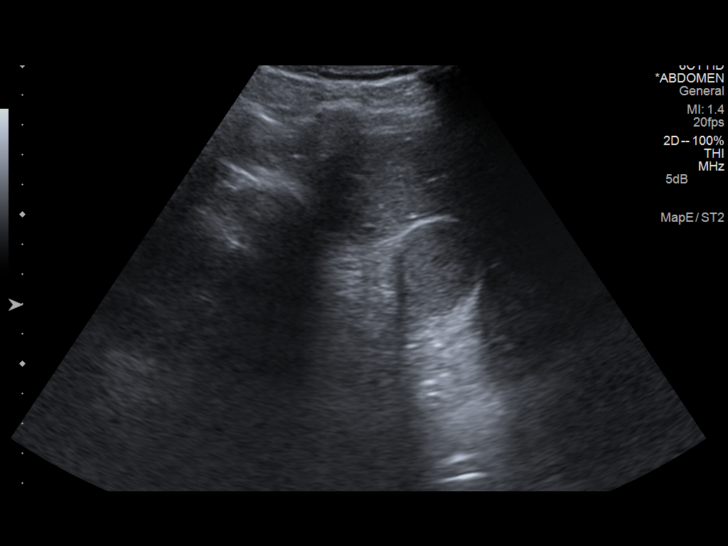
[im 33/36]
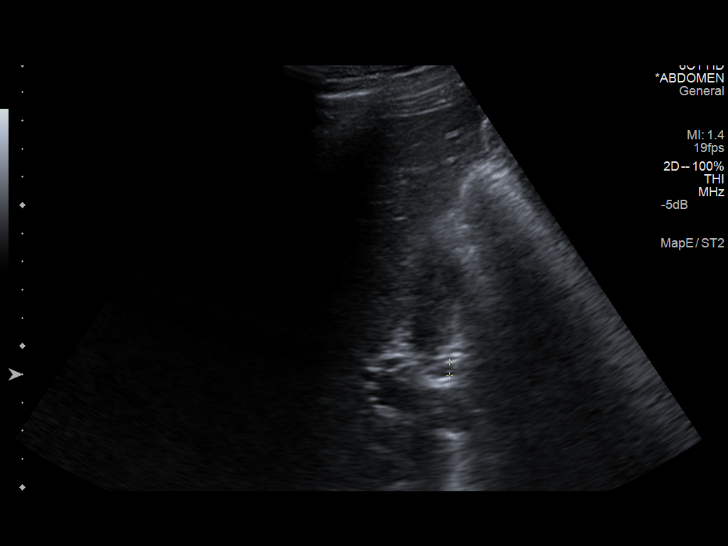
[im 36/36]
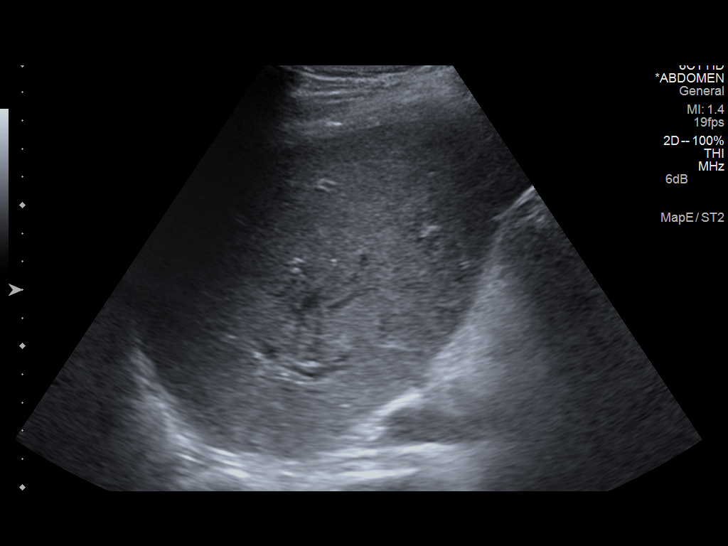

[14 of 25 positions shown; findings below may reference images not displayed]

FINDINGS: Gallbladder:

The gallbladder is adequately distended and contains echogenic bile
or sludge. No stones are evident. There is no gallbladder wall
thickening or pericholecystic fluid or positive sonographic Murphy's
sign.

Common bile duct:

Diameter: 4.8 mm

Liver:

The echotexture of the liver is somewhat heterogeneous. There is no
focal mass nor ductal dilation.
IMPRESSION: The gallbladder contains sludge versus radiodense bile and no
evidence of stones. There is no sonographic evidence of acute
cholecystitis. Further evaluation with a nuclear medicine
hepatobiliary scan and gallbladder ejection fraction may be useful.
The common bile duct and liver exhibit no acute abnormalities.

## 2016-03-01 DIAGNOSIS — Z9889 Other specified postprocedural states: Secondary | ICD-10-CM | POA: Insufficient documentation

## 2017-02-01 ENCOUNTER — Ambulatory Visit: Payer: Self-pay | Admitting: Podiatry

## 2018-07-25 DIAGNOSIS — M1990 Unspecified osteoarthritis, unspecified site: Secondary | ICD-10-CM | POA: Insufficient documentation

## 2018-07-25 DIAGNOSIS — Z8673 Personal history of transient ischemic attack (TIA), and cerebral infarction without residual deficits: Secondary | ICD-10-CM | POA: Insufficient documentation

## 2018-07-25 DIAGNOSIS — I10 Essential (primary) hypertension: Secondary | ICD-10-CM | POA: Insufficient documentation

## 2018-07-25 DIAGNOSIS — D72819 Decreased white blood cell count, unspecified: Secondary | ICD-10-CM | POA: Insufficient documentation

## 2018-07-27 ENCOUNTER — Encounter: Payer: Self-pay | Admitting: Podiatry

## 2018-07-27 ENCOUNTER — Ambulatory Visit (INDEPENDENT_AMBULATORY_CARE_PROVIDER_SITE_OTHER): Payer: BC Managed Care – PPO

## 2018-07-27 ENCOUNTER — Ambulatory Visit: Payer: BC Managed Care – PPO | Admitting: Podiatry

## 2018-07-27 VITALS — BP 149/90 | HR 53

## 2018-07-27 DIAGNOSIS — M7751 Other enthesopathy of right foot: Secondary | ICD-10-CM

## 2018-07-27 DIAGNOSIS — M25571 Pain in right ankle and joints of right foot: Secondary | ICD-10-CM | POA: Diagnosis not present

## 2018-07-27 DIAGNOSIS — M25471 Effusion, right ankle: Secondary | ICD-10-CM

## 2018-07-27 DIAGNOSIS — M76821 Posterior tibial tendinitis, right leg: Secondary | ICD-10-CM

## 2018-07-27 DIAGNOSIS — R269 Unspecified abnormalities of gait and mobility: Secondary | ICD-10-CM

## 2018-07-27 DIAGNOSIS — M7752 Other enthesopathy of left foot: Secondary | ICD-10-CM

## 2018-07-27 MED ORDER — MELOXICAM 15 MG PO TABS: 15.0000 mg | ORAL_TABLET | Freq: Every day | ORAL | 0 refills | Status: AC

## 2018-08-19 ENCOUNTER — Ambulatory Visit: Payer: BC Managed Care – PPO | Admitting: Orthotics

## 2018-08-19 DIAGNOSIS — M7751 Other enthesopathy of right foot: Secondary | ICD-10-CM

## 2018-08-19 DIAGNOSIS — M1990 Unspecified osteoarthritis, unspecified site: Secondary | ICD-10-CM

## 2018-09-30 NOTE — Progress Notes (Signed)
Patient came in today to pick up custom made foot orthotics.  The goals were accomplished and the patient reported no dissatisfaction with said orthotics.  Patient was advised of breakin period and how to report any issues. 

## 2018-11-10 NOTE — Progress Notes (Signed)
Subjective:  Patient ID: Lonnie Henry, male    DOB: [birth year],  MRN: [id number]  Chief Complaint  Patient presents with  . Foot Pain    right ankle pain    68 y.o. male presents with the above complaint.  Reports 5 weeks of right ankle pain patient is an avid runner has tried bracing and stretching has been running for many years with run 6 miles a day has most pain after running the next day has tried icing and compression sleeve.  Review of Systems: Negative except as noted in the HPI. Denies N/V/F/Ch.  Past Medical History:  Diagnosis Date  . Hypercholesterolemia   . Hypertension    Dr. Angela Nevin Homes (PCP)  . SBO (small bowel obstruction) (HCC) 01/07/2014  . 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  aspirin EC 81 MG tablet, Take by mouth., Disp: , Rfl:  .  co-enzyme Q-10 30 MG capsule, Take 30 mg by mouth daily., Disp: , Rfl:  .  Fish Oil-Cholecalciferol (FISH OIL + D3 PO), Take 1 g by mouth., Disp: , Rfl:  .  lisinopril (PRINIVIL,ZESTRIL) 20 MG tablet, Take 30-40 mg by mouth daily., Disp: , Rfl:  .  meloxicam (MOBIC) 15 MG tablet, Take 1 tablet (15 mg total) by mouth daily., Disp: 30 tablet, Rfl: 0 .  Multiple Vitamin (MULTIVITAMIN WITH MINERALS) TABS tablet, Take 1 tablet by mouth daily., Disp: , Rfl:  .  Multiple Vitamins-Iron (DAILY-VITE/IRON/BETA-CAROTENE) TABS, Take by mouth., Disp: , Rfl:  .  naproxen sodium (ANAPROX) 220 MG tablet, Take 440 mg by mouth daily as needed (for pain)., Disp: , Rfl:  .  Omega-3 Fatty Acids (FISH OIL) 1000 MG CAPS, Take 1,000 mg by mouth daily., Disp: , Rfl:  .  omeprazole (PRILOSEC) 20 MG capsule, Take 20 mg by mouth daily., Disp: , Rfl:  .  simvastatin (ZOCOR) 20 MG tablet, Take 20 mg by mouth daily at 6 PM., Disp: , Rfl:   Social History   Tobacco Use  Smoking Status Never Smoker  Smokeless Tobacco Never Used    No Known Allergies Objective:   Vitals:   07/27/18 0918  BP: (!) 149/90    Pulse: (!) 53   There is no height or weight on file to calculate BMI. Constitutional Well developed. Well nourished.  Vascular Dorsalis pedis pulses palpable bilaterally. Posterior tibial pulses palpable bilaterally. Capillary refill normal to all digits.  No cyanosis or clubbing noted. Pedal hair growth normal.  Neurologic Normal speech. Oriented to person, place, and time. Epicritic sensation to light touch grossly present bilaterally.  Dermatologic Nails well groomed and normal in appearance. No open wounds. No skin lesions.  Orthopedic: Normal joint ROM without pain or crepitus bilaterally. No visible deformities. Pain on palpation about the medial ankle right   Radiographs: Taken and reviewed no acute fractures dislocations Assessment:   1. Tendinitis of right ankle   2. Posterior tibial tendon dysfunction (PTTD) of right lower extremity   3. Pain and swelling of right ankle   4. Gait disturbance    Plan:  Patient was evaluated and treated and all questions answered.  Right posterior tibial tendinitis -X-rays reviewed as above -Discussed rest ice compression elevation -Dispensed Tri-Lock ankle brace educated on use.  Will immobilize the tendon to promote healing -Pharmacologic management: Rx meloxicam educated on risk benefits and use of medication  Return in about 6 weeks (around 09/07/2018) for PT , Tendonitis, Right.
